# Patient Record
Sex: Female | Born: 1970 | Race: White | Hispanic: No | Marital: Married | Smoking: Never smoker
Health system: Southern US, Community
[De-identification: ages and names within clinical notes are randomized; demographics above are authoritative.]

## PROBLEM LIST (undated history)

## (undated) DIAGNOSIS — R519 Headache, unspecified: Secondary | ICD-10-CM

## (undated) DIAGNOSIS — R3989 Other symptoms and signs involving the genitourinary system: Secondary | ICD-10-CM

## (undated) DIAGNOSIS — N3020 Other chronic cystitis without hematuria: Secondary | ICD-10-CM

## (undated) DIAGNOSIS — D414 Neoplasm of uncertain behavior of bladder: Secondary | ICD-10-CM

## (undated) DIAGNOSIS — N200 Calculus of kidney: Secondary | ICD-10-CM

## (undated) DIAGNOSIS — N3010 Interstitial cystitis (chronic) without hematuria: Secondary | ICD-10-CM

## (undated) DIAGNOSIS — K8020 Calculus of gallbladder without cholecystitis without obstruction: Secondary | ICD-10-CM

## (undated) DIAGNOSIS — R51 Headache: Secondary | ICD-10-CM

## (undated) DIAGNOSIS — G8929 Other chronic pain: Secondary | ICD-10-CM

## (undated) DIAGNOSIS — R310 Gross hematuria: Secondary | ICD-10-CM

## (undated) HISTORY — DX: Calculus of kidney: N20.0

## (undated) HISTORY — PX: BOTOX INJECTION: SHX5754

## (undated) HISTORY — DX: Headache: R51

## (undated) HISTORY — DX: Interstitial cystitis (chronic) without hematuria: N30.10

## (undated) HISTORY — DX: Other symptoms and signs involving the genitourinary system: R39.89

## (undated) HISTORY — DX: Other chronic cystitis without hematuria: N30.20

## (undated) HISTORY — DX: Gross hematuria: R31.0

## (undated) HISTORY — DX: Neoplasm of uncertain behavior of bladder: D41.4

## (undated) HISTORY — DX: Other chronic pain: G89.29

## (undated) HISTORY — DX: Headache, unspecified: R51.9

## (undated) HISTORY — PX: CYSTOSTOMY W/ BLADDER BIOPSY: SHX1431

## (undated) HISTORY — PX: BILATERAL SALPINGOOPHORECTOMY: SHX1223

## (undated) HISTORY — PX: APPENDECTOMY: SHX54

## (undated) HISTORY — PX: LUMBAR SPINE SURGERY: SHX701

## (undated) HISTORY — PX: OTHER SURGICAL HISTORY: SHX169

## (undated) HISTORY — DX: Calculus of gallbladder without cholecystitis without obstruction: K80.20

---

## 1992-05-29 ENCOUNTER — Encounter: Payer: Self-pay | Admitting: Family Medicine

## 1992-05-29 LAB — CONVERTED CEMR LAB: Blood Glucose, Fasting: 78 mg/dL

## 1994-08-31 ENCOUNTER — Encounter: Payer: Self-pay | Admitting: Family Medicine

## 1994-08-31 LAB — CONVERTED CEMR LAB: Pap Smear: NORMAL

## 1995-06-19 ENCOUNTER — Encounter: Payer: Self-pay | Admitting: Family Medicine

## 1995-06-19 LAB — CONVERTED CEMR LAB: TSH: 0.92 microintl units/mL

## 1995-06-25 ENCOUNTER — Encounter: Payer: Self-pay | Admitting: Family Medicine

## 1995-06-25 LAB — CONVERTED CEMR LAB: Pap Smear: NORMAL

## 1998-01-26 ENCOUNTER — Ambulatory Visit (HOSPITAL_COMMUNITY): Admission: RE | Admit: 1998-01-26 | Discharge: 1998-01-26 | Payer: Self-pay | Admitting: Neurosurgery

## 1998-01-26 ENCOUNTER — Encounter: Payer: Self-pay | Admitting: Neurosurgery

## 1999-07-06 HISTORY — PX: LAPAROSCOPY ABDOMEN DIAGNOSTIC: PRO50

## 1999-10-06 HISTORY — PX: ABDOMINAL HYSTERECTOMY: SHX81

## 2000-10-08 ENCOUNTER — Encounter: Payer: Self-pay | Admitting: Family Medicine

## 2000-10-08 LAB — CONVERTED CEMR LAB: Blood Glucose, Fasting: 85 mg/dL

## 2001-12-08 ENCOUNTER — Encounter: Payer: Self-pay | Admitting: Family Medicine

## 2001-12-08 LAB — CONVERTED CEMR LAB
RBC count: 4.55 10*6/uL
WBC, blood: 6.7 10*3/uL

## 2004-02-01 ENCOUNTER — Ambulatory Visit: Payer: Self-pay | Admitting: Family Medicine

## 2005-10-09 ENCOUNTER — Ambulatory Visit: Payer: Self-pay | Admitting: Family Medicine

## 2005-10-09 ENCOUNTER — Ambulatory Visit: Payer: Self-pay | Admitting: Internal Medicine

## 2005-10-23 ENCOUNTER — Ambulatory Visit: Payer: Self-pay | Admitting: Surgery

## 2005-10-23 HISTORY — PX: CHOLECYSTECTOMY: SHX55

## 2005-11-19 ENCOUNTER — Ambulatory Visit: Payer: Self-pay | Admitting: Surgery

## 2005-11-19 ENCOUNTER — Ambulatory Visit: Payer: Self-pay | Admitting: Unknown Physician Specialty

## 2005-11-23 ENCOUNTER — Ambulatory Visit: Payer: Self-pay | Admitting: Surgery

## 2007-03-29 ENCOUNTER — Emergency Department: Payer: Self-pay | Admitting: Emergency Medicine

## 2007-04-14 ENCOUNTER — Ambulatory Visit: Payer: Self-pay | Admitting: Family Medicine

## 2007-10-06 HISTORY — PX: CT ABD W & PELVIS WO CM: HXRAD294

## 2007-10-18 ENCOUNTER — Ambulatory Visit: Payer: Self-pay | Admitting: Urology

## 2007-10-18 ENCOUNTER — Encounter: Payer: Self-pay | Admitting: Family Medicine

## 2007-10-18 DIAGNOSIS — D414 Neoplasm of uncertain behavior of bladder: Secondary | ICD-10-CM | POA: Insufficient documentation

## 2007-10-26 DIAGNOSIS — K5730 Diverticulosis of large intestine without perforation or abscess without bleeding: Secondary | ICD-10-CM | POA: Insufficient documentation

## 2007-10-28 ENCOUNTER — Ambulatory Visit: Payer: Self-pay | Admitting: Urology

## 2007-11-03 HISTORY — PX: OTHER SURGICAL HISTORY: SHX169

## 2007-11-08 ENCOUNTER — Ambulatory Visit: Payer: Self-pay | Admitting: Urology

## 2007-11-18 ENCOUNTER — Encounter: Payer: Self-pay | Admitting: Family Medicine

## 2007-11-18 DIAGNOSIS — N3010 Interstitial cystitis (chronic) without hematuria: Secondary | ICD-10-CM | POA: Insufficient documentation

## 2007-11-18 DIAGNOSIS — R310 Gross hematuria: Secondary | ICD-10-CM | POA: Insufficient documentation

## 2007-11-23 DIAGNOSIS — N200 Calculus of kidney: Secondary | ICD-10-CM | POA: Insufficient documentation

## 2008-03-05 ENCOUNTER — Encounter: Payer: Self-pay | Admitting: Family Medicine

## 2008-04-12 ENCOUNTER — Emergency Department: Payer: Self-pay | Admitting: Emergency Medicine

## 2008-06-12 ENCOUNTER — Ambulatory Visit: Payer: Self-pay | Admitting: Family Medicine

## 2008-06-12 DIAGNOSIS — J019 Acute sinusitis, unspecified: Secondary | ICD-10-CM | POA: Insufficient documentation

## 2008-06-13 ENCOUNTER — Encounter: Payer: Self-pay | Admitting: Family Medicine

## 2008-06-14 DIAGNOSIS — E785 Hyperlipidemia, unspecified: Secondary | ICD-10-CM | POA: Insufficient documentation

## 2008-06-14 DIAGNOSIS — E7800 Pure hypercholesterolemia, unspecified: Secondary | ICD-10-CM

## 2008-07-13 ENCOUNTER — Telehealth (INDEPENDENT_AMBULATORY_CARE_PROVIDER_SITE_OTHER): Payer: Self-pay | Admitting: Internal Medicine

## 2008-11-26 ENCOUNTER — Ambulatory Visit: Payer: Self-pay | Admitting: Urology

## 2008-12-12 ENCOUNTER — Encounter: Payer: Self-pay | Admitting: Family Medicine

## 2009-03-18 ENCOUNTER — Ambulatory Visit: Payer: Self-pay | Admitting: Unknown Physician Specialty

## 2009-08-13 ENCOUNTER — Encounter (INDEPENDENT_AMBULATORY_CARE_PROVIDER_SITE_OTHER): Payer: Self-pay | Admitting: *Deleted

## 2009-12-25 ENCOUNTER — Ambulatory Visit: Payer: Self-pay | Admitting: Family Medicine

## 2010-01-16 ENCOUNTER — Encounter: Payer: Self-pay | Admitting: Family Medicine

## 2010-02-04 NOTE — Letter (Signed)
Summary: Nadara Eaton letter  [place] [facility]  [address] [place], [place] [address]   Phone: [phone]  Fax: [phone]       08/13/2009 MRN: [id number]  [facility] [address] [place], [place]  [address]  Dear Ms. [facility] [place], and [facility] announce the retirement of Arta Silence, M.D., from full-time practice at the [facility] office effective July 04, 2009 and his plans of returning part-time.  It is important to Dr. Hetty Ely and to our practice that you understand that [facility] has seven physicians in our office for your health care needs.  We will continue to offer the same exceptional care that you have today.    Dr. Hetty Ely has spoken to many of you about his plans for retirement and returning part-time in the fall.   We will continue to work with you through the transition to schedule appointments for you in the office and meet the high standards that [facility] is committed to.   Again, it is with great pleasure that we share the news that Dr. Hetty Ely will return to [facility] in October of 2011 with a reduced schedule.    If you have any questions, or would like to request an appointment with one of our physicians, please call us at [phone] and press the option for Scheduling an appointment.  We take pleasure in providing you with excellent patient care and look forward to seeing you at your next office visit.  Our [facility] Physicians are:  Tillman Abide, M.D. Laurita Quint, M.D. Roxy Manns, M.D. Kerby Nora, M.D. Hannah Beat, M.D. Ruthe Mannan, M.D. We proudly welcomed Raechel Ache, M.D. and Eustaquio Boyden, M.D. to the practice in July/August 2011.  Sincerely,  [facility]

## 2010-02-04 NOTE — Letter (Signed)
Summary: [facility] / EVAL OF A BLADDER MASS / DR. Arlys John COPE  [facility] / EVAL OF A BLADDER MASS / DR. Arlys John COPE   Imported By: Carin Primrose 03/06/2008 12:20:18  _____________________________________________________________________  External Attachment:    Type:   Image     Comment:   External Document

## 2010-02-06 NOTE — Letter (Signed)
Summary: [facility]  [facility]   Imported By: Maryln Gottron 01/27/2010 12:27:13  _____________________________________________________________________  External Attachment:    Type:   Image     Comment:   External Document

## 2010-11-25 ENCOUNTER — Ambulatory Visit (INDEPENDENT_AMBULATORY_CARE_PROVIDER_SITE_OTHER): Payer: PRIVATE HEALTH INSURANCE | Admitting: Family Medicine

## 2010-11-25 ENCOUNTER — Encounter: Payer: Self-pay | Admitting: Family Medicine

## 2010-11-25 DIAGNOSIS — B009 Herpesviral infection, unspecified: Secondary | ICD-10-CM

## 2010-11-25 DIAGNOSIS — J111 Influenza due to unidentified influenza virus with other respiratory manifestations: Secondary | ICD-10-CM

## 2010-11-25 DIAGNOSIS — B001 Herpesviral vesicular dermatitis: Secondary | ICD-10-CM | POA: Insufficient documentation

## 2010-11-25 DIAGNOSIS — J019 Acute sinusitis, unspecified: Secondary | ICD-10-CM

## 2010-11-25 MED ORDER — CHLORPHENIRAMINE-HYDROCODONE 8-10 MG/5ML PO LQCR: 5.0000 mL | Freq: Two times a day (BID) | ORAL | Status: DC | PRN

## 2010-11-25 MED ORDER — AZITHROMYCIN 2 G PO SUSR: 2.0000 g | Freq: Once | ORAL | Status: AC

## 2010-11-25 MED ORDER — VALACYCLOVIR HCL 500 MG PO TABS: 500.0000 mg | ORAL_TABLET | Freq: Three times a day (TID) | ORAL | Status: DC

## 2010-11-25 NOTE — Progress Notes (Signed)
  Subjective:    Patient ID: Hannah [name], female    DOB: 1970/08/01, 40 y.o.   MRN: [id number]  HPI Pt of mine last seen over two years ago for similar sxs now here for cough, drainage and fever up to 102. She is congested and slightly hoarse. She has had fever to 102 yesterday at work after Advil. She has some chest congestion and bad cough woith productive gray mucous. She has ear discomfort and headache in the frontal area with cough. She ha been sleeping poorly thr lewst few nights. Nasal discharge has been mild but has mild ST. She had N/V this AM. She is very achey.  She has been seeing Dr Achilles Dunk for ICS. She has been having lots of discomfort from that. Migraines are improving. Back is better.  She has a fever blister forming as well.  She now works at [employer] in [place] but they are programmed to relocate outside the country in two years.    Review of SystemsNoncontributory except as above.       Objective:   Physical Exam  Constitutional: She appears well-developed and well-nourished. No distress.       Congested and hoarse.  HENT:  Head: Normocephalic and atraumatic.  Right Ear: External ear normal.  Left Ear: External ear normal.  Nose: Nose normal.  Mouth/Throat: Oropharynx is clear and moist. No oropharyngeal exudate.  Eyes: Conjunctivae and EOM are normal. Pupils are equal, round, and reactive to light.  Neck: Normal range of motion. Neck supple. No thyromegaly present.  Cardiovascular: Normal rate, regular rhythm and normal heart sounds.   Pulmonary/Chest: Effort normal and breath sounds normal. She has no wheezes. She has no rales.  Lymphadenopathy:    She has no cervical adenopathy.  Skin: She is not diaphoretic.          Assessment & Plan:

## 2010-11-25 NOTE — Assessment & Plan Note (Signed)
Has used valtrex in the past. Will represcribe. Use as directed.

## 2010-11-25 NOTE — Assessment & Plan Note (Signed)
Will treat as bacterial with Zithro. See instructions.

## 2010-11-25 NOTE — Assessment & Plan Note (Signed)
Probably an element of this as well. Will try to decongest, see instructions.

## 2010-11-25 NOTE — Patient Instructions (Signed)
Take Zithro. Take Guaifenesin (400mg ), take 11/2 tabs by mouth AM and NOON. Get GUAIFENESIN by  [facility] or [facility] and getting MUCOUS RELIEF EXPECTORANT/CONGESTION. DO NOT GET MUCINEX (Timed Release Guaifenesin)  Drink fluids. Take Delsym during the day. Take Tussionex at night. Vicks at night. Keep lozenge in mouth during day.

## 2010-11-28 ENCOUNTER — Telehealth: Payer: Self-pay | Admitting: *Deleted

## 2010-11-28 MED ORDER — AZITHROMYCIN 250 MG PO TABS: ORAL_TABLET | ORAL | Status: AC

## 2010-11-28 NOTE — Telephone Encounter (Signed)
Patient advised.

## 2010-11-28 NOTE — Telephone Encounter (Signed)
Sent!

## 2010-11-28 NOTE — Telephone Encounter (Signed)
Pt was seen earlier this week for bronchitis and give zithromycin to take as one dose.  She says that problem is better but now she has a full blown sinus infection with drainage and pressure and she is asking that a z-pack be called to [employer].  She says she needs the full pack to get over this.

## 2011-02-23 ENCOUNTER — Encounter: Payer: Self-pay | Admitting: Family Medicine

## 2011-02-23 DIAGNOSIS — F419 Anxiety disorder, unspecified: Secondary | ICD-10-CM | POA: Insufficient documentation

## 2011-06-18 ENCOUNTER — Encounter: Payer: Self-pay | Admitting: Family Medicine

## 2011-06-18 ENCOUNTER — Ambulatory Visit (INDEPENDENT_AMBULATORY_CARE_PROVIDER_SITE_OTHER): Payer: PRIVATE HEALTH INSURANCE | Admitting: Family Medicine

## 2011-06-18 VITALS — BP 126/84 | HR 93 | Temp 98.5°F | Wt 192.0 lb

## 2011-06-18 DIAGNOSIS — M549 Dorsalgia, unspecified: Secondary | ICD-10-CM

## 2011-06-18 DIAGNOSIS — G43109 Migraine with aura, not intractable, without status migrainosus: Secondary | ICD-10-CM

## 2011-06-18 MED ORDER — ZOLMITRIPTAN 5 MG PO TABS: 2.5000 mg | ORAL_TABLET | ORAL | Status: DC | PRN

## 2011-06-18 MED ORDER — LIDOCAINE 5 % EX PTCH: 1.0000 | MEDICATED_PATCH | CUTANEOUS | Status: DC

## 2011-06-18 MED ORDER — CYCLOBENZAPRINE HCL 10 MG PO TABS: 10.0000 mg | ORAL_TABLET | Freq: Three times a day (TID) | ORAL | Status: DC | PRN

## 2011-06-18 NOTE — Progress Notes (Signed)
Back pain.  Used Lidoderm patches prev with a flare.  Used since back surgery with relief.  Used episodically.  Again with flare of back pain in typical location, down R leg, radicular distribution.  No trigger, fall, trauma with this episode.  No leg weakness.  No bowel/bladder incontinence.  She can take ibuprofen, took some today.  Has rx for hydrocodone at home, hasn't used.  It was left over from renal stone prev.  Also has used fiorcet for back pain and headaches, but not recently.   H/o migraines with aura.  Episodic. Much improved after hysterectomy.  She would like refill zomig.  Has used prev with relief.    PMH and SH reviewed  ROS: See HPI, otherwise noncontributory.  Meds, vitals, and allergies reviewed.   Nad ncat Mmm rrr ctab  abd soft, not ttp Ext w/o edema Back w/o midline pain but pain into R leg with hip flexion, walks with limp and adjusts in chair to get relief.  CN 2-12 wnl B, S/S/DTR wnl x4 No rash

## 2011-06-18 NOTE — Patient Instructions (Signed)
I would use the patches as needed along with ibuprofen (up to 600mg  3 times a day) with food.  Keep stretching.  Take the zomig as needed.  Take care.  Glad to see you.

## 2011-06-19 ENCOUNTER — Encounter: Payer: Self-pay | Admitting: Family Medicine

## 2011-06-19 DIAGNOSIS — M549 Dorsalgia, unspecified: Secondary | ICD-10-CM | POA: Insufficient documentation

## 2011-06-19 DIAGNOSIS — G43109 Migraine with aura, not intractable, without status migrainosus: Secondary | ICD-10-CM | POA: Insufficient documentation

## 2011-06-19 NOTE — Assessment & Plan Note (Signed)
Refill triptan, she may need fiorcet rx in future.  Improved after hysterectomy.

## 2011-06-19 NOTE — Assessment & Plan Note (Signed)
Routine flare, use ibuprofen and lidoderm patches and f/u prn.  D/w pt about weight and stretching.  No emergent sx.  F/u prn.  >25 min spent with face to face with patient, >50% counseling and/or coordinating care.

## 2011-09-02 ENCOUNTER — Emergency Department (HOSPITAL_COMMUNITY)
Admission: EM | Admit: 2011-09-02 | Discharge: 2011-09-02 | Disposition: A | Discharge status: Home / Self Care | Payer: PRIVATE HEALTH INSURANCE | Attending: Emergency Medicine | Admitting: Emergency Medicine

## 2011-09-02 ENCOUNTER — Telehealth: Payer: Self-pay | Admitting: Family Medicine

## 2011-09-02 ENCOUNTER — Encounter (HOSPITAL_COMMUNITY): Payer: Self-pay | Admitting: Family Medicine

## 2011-09-02 DIAGNOSIS — R51 Headache: Secondary | ICD-10-CM | POA: Insufficient documentation

## 2011-09-02 DIAGNOSIS — R209 Unspecified disturbances of skin sensation: Secondary | ICD-10-CM | POA: Insufficient documentation

## 2011-09-02 NOTE — Telephone Encounter (Signed)
Pt is calling concerning her left side of her face and left eye that has been twitching. Under neath left eye around to her face has become numb and tingling. Left lip has been drawing up off and on ?? Today it has become more pronounced and more severe. She is now having pains in her face with it. She is not sure what she should do or if she needs to come today or if can wait until tomorrow???

## 2011-09-02 NOTE — ED Notes (Signed)
Pt notified RN that she was leaving.  Talked with pt about the importance of staying and being seen by MD.  Pt refused and said she is leaving.

## 2011-09-02 NOTE — Telephone Encounter (Signed)
Patient notified as instructed by telephone. Patient stated that she is at work and will call her husband and have him come and take her to the ER. Patient stated that she will call back and let Dr. Para March know where she will be going.

## 2011-09-02 NOTE — Telephone Encounter (Signed)
Patient called to let you know she's at [facility] ER.  They may do a CT Scan.  She's still numb and tingling and the pain is worse on her cheekbone and temple.  Patient is upset because when she went into the ER they took all of her medications that were over 90 days out of the system.

## 2011-09-02 NOTE — ED Notes (Signed)
Per pt sts since Monday she has had left sided facial pain, numbness, tinglingling that has gotten worse. sts started in her eye and had extended throughout the entire left side of her face. sts also some tingling in left arm. No other deficits noted. Pt A&Ox3.

## 2011-09-02 NOTE — Telephone Encounter (Signed)
If she is having new unilateral neurologic sx, then I would go to ER ASAP or dial 911.

## 2011-09-02 NOTE — Telephone Encounter (Signed)
Noted. Will await ER notes.

## 2011-09-03 ENCOUNTER — Other Ambulatory Visit: Payer: Self-pay | Admitting: Family Medicine

## 2011-09-03 NOTE — Telephone Encounter (Signed)
Electronic refill request.  Please advise. 

## 2011-09-03 NOTE — Telephone Encounter (Signed)
Spoke with Hannah [name] about this. Pt is calling back about her experience at the ED and how bad it was . She had to wait for 4 hours and was still going to have wait another 3 hours after she left. She said she couldn't wait any longer and there were so many sick people sitting out there. She got a migraine from sitting there. She says she not mad at [place] she was furious with the ER staff and how badly they treated her. She spoke with the nursing director over there as well. They told her they didn't know why her PCP did not schedule a CT scan because it would have saved some time. I told her she would have to been seen before we could schedule that for her. She understood. She wanted to make sure that her Zomeg for her migrane's would be called in by the end of today if possible. I wasn't sure if you wanted her to come back in to be seen or not ??? She says she has gotten 80% better than yesterday but her face is not completely better ???

## 2011-09-03 NOTE — Telephone Encounter (Signed)
I still don't know what her condition is since she hasn't been seen.  I will not send for CT w/o eval first.  I would still advise eval, either at our clinic or the ER if emergent neuro sx. Refill sent, but this doesn't change the advice prev listed.

## 2011-09-04 NOTE — Telephone Encounter (Signed)
LMOVM of home phone.  Cell phone did not have messaging capabilities.

## 2011-09-11 ENCOUNTER — Ambulatory Visit (INDEPENDENT_AMBULATORY_CARE_PROVIDER_SITE_OTHER): Payer: PRIVATE HEALTH INSURANCE | Admitting: Family Medicine

## 2011-09-11 ENCOUNTER — Encounter: Payer: Self-pay | Admitting: Family Medicine

## 2011-09-11 VITALS — BP 110/78 | HR 90 | Temp 98.6°F | Wt 190.8 lb

## 2011-09-11 DIAGNOSIS — J069 Acute upper respiratory infection, unspecified: Secondary | ICD-10-CM

## 2011-09-11 DIAGNOSIS — R799 Abnormal finding of blood chemistry, unspecified: Secondary | ICD-10-CM | POA: Insufficient documentation

## 2011-09-11 DIAGNOSIS — R339 Retention of urine, unspecified: Secondary | ICD-10-CM | POA: Insufficient documentation

## 2011-09-11 DIAGNOSIS — G43109 Migraine with aura, not intractable, without status migrainosus: Secondary | ICD-10-CM

## 2011-09-11 DIAGNOSIS — R350 Frequency of micturition: Secondary | ICD-10-CM | POA: Insufficient documentation

## 2011-09-11 MED ORDER — BENZONATATE 200 MG PO CAPS: 200.0000 mg | ORAL_CAPSULE | Freq: Three times a day (TID) | ORAL | Status: AC | PRN

## 2011-09-11 NOTE — Progress Notes (Signed)
Recently with possible migraine.  She had an episode with L side of face was twitching and and tingling and then it felt like her lower lip and eyebrow were drawing to the left side (but it wasn't noted on inspection by patient).  She couldn't wait for ER eval and the sx started to resolve.  If fully resolved for a few days but then it restarted in last 2 days.  She has some numbness/tingling in the L side of the face.    This isn't typical for her old migraines- they were much improved after hysterectomy.  She had the prev skin drawing sensation but it was on her scalp (with episodes years ago).  No nausea with these recent episodes.    She has had some HA recently that responded to zomig.  Last zomig dose was 1 week ago.  No rash.    No right sided facial sx.  She has some tingling in the L arm on the day of the ER visit but this was brief and not pronounced.    Also with cough, rhinorrhea, sputum production in last few days.  Using SABA with some relief, but it makes her jittery. She's achy.    Meds, vitals, and allergies reviewed.   ROS: See HPI.  Otherwise, noncontributory.  GEN: nad, alert and oriented HEENT: mucous membranes moist, tm w/o erythema, nasal exam w/o erythema, clear discharge noted,  OP with cobblestoning NECK: supple w/o LA CV: rrr.   PULM: ctab, no inc wob EXT: no edema SKIN: no acute rash CN 2-12 wnl B, S/S/DTR wnl x4 but photophobia on exam noted

## 2011-09-11 NOTE — Patient Instructions (Signed)
It appears that you are having migraines again.  Get back on the migraine diet. Use the zomig and flexeril as needed.  Use tessalon and the inhaler for the cough.  It should gradually get better.  If the migraines don't improve, then notify me.

## 2011-09-13 ENCOUNTER — Encounter: Payer: Self-pay | Admitting: Family Medicine

## 2011-09-13 DIAGNOSIS — J069 Acute upper respiratory infection, unspecified: Secondary | ICD-10-CM | POA: Insufficient documentation

## 2011-09-13 NOTE — Assessment & Plan Note (Signed)
Likely viral, nontoxic, supportive tx and f/u prn.  She agrees. ddx d/w pt.

## 2011-09-13 NOTE — Assessment & Plan Note (Signed)
It appears that she is having recurrent migraines with the neuro sx described above.  Highly unlikely for another dx at this point, ie CVA.  Neuro exam wnl except for photophobia as expected.  At this point, will treat as migraine with zomig and flexeril.  She'll start back on migraine diet and will update me as needed.  She does have sig social stressors with family situation recently and this may have influenced her migraine frequency.  >25 min spent with face to face with patient, >50% counseling and/or coordinating care.  She agrees with plan.

## 2011-12-24 ENCOUNTER — Ambulatory Visit: Payer: Self-pay | Admitting: Urology

## 2012-02-10 ENCOUNTER — Encounter: Payer: Self-pay | Admitting: Family Medicine

## 2012-02-10 DIAGNOSIS — R945 Abnormal results of liver function studies: Secondary | ICD-10-CM

## 2012-03-21 ENCOUNTER — Ambulatory Visit: Payer: PRIVATE HEALTH INSURANCE | Admitting: Family Medicine

## 2012-03-21 ENCOUNTER — Other Ambulatory Visit: Payer: Self-pay

## 2012-03-21 MED ORDER — ALBUTEROL SULFATE HFA 108 (90 BASE) MCG/ACT IN AERS: 1.0000 | INHALATION_SPRAY | RESPIRATORY_TRACT | Status: DC | PRN

## 2012-03-21 NOTE — Telephone Encounter (Signed)
Sent, if not improved or if needing >2-3x/week, then she needs f/u here.  Thanks.

## 2012-03-21 NOTE — Telephone Encounter (Signed)
Patient has appt here today with Dr. Para March.

## 2012-03-21 NOTE — Telephone Encounter (Signed)
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 One inhaler last pt at least one year.[facility].Please advise.

## 2012-03-22 ENCOUNTER — Ambulatory Visit (INDEPENDENT_AMBULATORY_CARE_PROVIDER_SITE_OTHER): Payer: PRIVATE HEALTH INSURANCE | Admitting: Family Medicine

## 2012-03-22 ENCOUNTER — Encounter: Payer: Self-pay | Admitting: Family Medicine

## 2012-03-22 VITALS — BP 122/76 | HR 92 | Temp 98.2°F

## 2012-03-22 DIAGNOSIS — J019 Acute sinusitis, unspecified: Secondary | ICD-10-CM

## 2012-03-22 DIAGNOSIS — J329 Chronic sinusitis, unspecified: Secondary | ICD-10-CM | POA: Insufficient documentation

## 2012-03-22 MED ORDER — AMOXICILLIN-POT CLAVULANATE 875-125 MG PO TABS: 1.0000 | ORAL_TABLET | Freq: Two times a day (BID) | ORAL | Status: DC

## 2012-03-22 MED ORDER — HYDROCOD POLST-CHLORPHEN POLST 10-8 MG/5ML PO LQCR: 5.0000 mL | Freq: Two times a day (BID) | ORAL | Status: DC | PRN

## 2012-03-22 MED ORDER — FLUCONAZOLE 150 MG PO TABS: 150.0000 mg | ORAL_TABLET | Freq: Once | ORAL | Status: DC

## 2012-03-22 NOTE — Patient Instructions (Addendum)
Start the augmentin today, use the diflucan if needed.  Continue the albuterol as needed and take the tussionex as needed (sedation caution). Take care.  Glad to see you.

## 2012-03-22 NOTE — Progress Notes (Signed)
Recently with ST, postnasal gtt, fatigue, cough.  Started about 5 day ago. Taking otc cough and allergy meds w/o relief. Rhinorrhea, discolored. Then had more facial pressure.  More cough and wheeze over the last few days, better now with SABA.  Nearly post-tussive emesis.  Now with discolored sputum.  Prev with R ear pain.  She doesn't normally need albuterol.  Unclear if she had a fever.  She has felt hot and had chills.    Meds, vitals, and allergies reviewed.   ROS: See HPI.  Otherwise, noncontributory.  GEN: nad, alert and oriented HEENT: mucous membranes moist, tm w/o erythema, nasal exam w/o erythema, clear discharge noted,  OP with cobblestoning, sinuses ttp x4 NECK: supple w/o LA CV: rrr.   PULM: ctab, no inc wob, no wheeze.  Dry cough noted.  EXT: no edema SKIN: no acute rash

## 2012-03-22 NOTE — Assessment & Plan Note (Signed)
Nontoxic, augmentin, supportive tx.  Use diflucan if needed after abx.  Continue saba and use tussionex prn cough, sedation caution.  F/u prn.

## 2012-05-03 ENCOUNTER — Ambulatory Visit: Payer: Self-pay | Admitting: Urology

## 2012-11-03 ENCOUNTER — Ambulatory Visit (INDEPENDENT_AMBULATORY_CARE_PROVIDER_SITE_OTHER): Payer: Managed Care, Other (non HMO) | Admitting: Family Medicine

## 2012-11-03 ENCOUNTER — Encounter: Payer: Self-pay | Admitting: Family Medicine

## 2012-11-03 VITALS — BP 118/72 | HR 80 | Temp 97.3°F | Wt 192.5 lb

## 2012-11-03 DIAGNOSIS — G43109 Migraine with aura, not intractable, without status migrainosus: Secondary | ICD-10-CM

## 2012-11-03 DIAGNOSIS — N390 Urinary tract infection, site not specified: Secondary | ICD-10-CM | POA: Insufficient documentation

## 2012-11-03 DIAGNOSIS — R300 Dysuria: Secondary | ICD-10-CM

## 2012-11-03 LAB — POCT URINALYSIS DIPSTICK
Glucose, UA: NEGATIVE
Spec Grav, UA: <=1.005
Urobilinogen, UA: NEGATIVE

## 2012-11-03 MED ORDER — CIPROFLOXACIN HCL 250 MG PO TABS: 250.0000 mg | ORAL_TABLET | Freq: Two times a day (BID) | ORAL | Status: DC

## 2012-11-03 MED ORDER — LIDOCAINE 5 % EX PTCH: 1.0000 | MEDICATED_PATCH | CUTANEOUS | Status: DC

## 2012-11-03 NOTE — Assessment & Plan Note (Signed)
Would try ibuprofen for tension headaches and would avoid butalbital for now.

## 2012-11-03 NOTE — Progress Notes (Signed)
Botox in bladder had helped patient a lot.  Recently with a twitchy sensation in lower abd. Then some lower back pain.  Then with dysuria, burning with urination.  She is achy.  She couldn't get in with uro so she came in here.  U/a d/w pt.   Migraines better after the hysterectomy.  She has usually rare sx.  Likely worse with schedule changes and travel.  She went from 1 a year to 3 or 4 a year.  She had taken butalbital rarely for tension headaches.  She had substituted ibuprofen instead.    Meds, vitals, and allergies reviewed.   ROS: See HPI.  Otherwise negative.    GEN: nad, alert and oriented HEENT: mucous membranes moist NECK: supple CV: rrr.  PULM: ctab, no inc wob ABD: soft, +bs, suprapubic area tender EXT: no edema SKIN: no acute rash BACK: no CVA pain

## 2012-11-03 NOTE — Assessment & Plan Note (Signed)
Cipro, fluids, ucx, fu prn.  D/w pt.  She agrees, nontoxic.

## 2012-11-03 NOTE — Patient Instructions (Signed)
Drink plenty of water and start the antibiotics today.  We'll contact you with your lab report.  Take care.   

## 2012-11-04 ENCOUNTER — Ambulatory Visit: Payer: PRIVATE HEALTH INSURANCE | Admitting: Family Medicine

## 2012-11-05 LAB — URINE CULTURE: Colony Count: >=100000

## 2012-11-07 ENCOUNTER — Telehealth: Payer: Self-pay | Admitting: Family Medicine

## 2012-11-07 ENCOUNTER — Telehealth: Payer: Self-pay

## 2012-11-07 ENCOUNTER — Telehealth: Payer: Self-pay | Admitting: *Deleted

## 2012-11-07 MED ORDER — BUTALBITAL-ASPIRIN-CAFFEINE 50-325-40 MG PO CAPS: 1.0000 | ORAL_CAPSULE | Freq: Two times a day (BID) | ORAL | Status: DC | PRN

## 2012-11-07 MED ORDER — CIPROFLOXACIN HCL 500 MG PO TABS: 500.0000 mg | ORAL_TABLET | Freq: Two times a day (BID) | ORAL | Status: DC

## 2012-11-07 NOTE — Telephone Encounter (Signed)
Patient says she became sicker over the weekend and when she called in, the MD on call looked at her culture and gave her some advice.  She wanted to be sure that you knew that. Also, she found her mother dead yesterday and would like to talk to you about getting a Rx to help her through this time of stress.  She asks that you call her at [phone]

## 2012-11-07 NOTE — Telephone Encounter (Signed)
I called pt.  I offered my condolences.  ucx sens to cipro, rx sent.   We talked about the pain meds for HA.  Would be reasonable to try given the recent episodes.  rx sent.  Please call in the fiorinal.   She'll try melatonin and benadryl for sleep if needed.   She'll update me as needed.   I talked to her about the ucx results, you don't need to call her about that.

## 2012-11-07 NOTE — Telephone Encounter (Signed)
See next note

## 2012-11-07 NOTE — Telephone Encounter (Signed)
Pt was seen 11/03/12; pt said she called back on 11/05/12 and spoke with on call dr who advised pt to double her cipro and contact pcp today; pt has taken cipro 2 tab on Sat night and 2 tab twice a day on Sun.Today pt states she is 40 % better with UTI symptoms but is out of med and request Cipro for long term use or a stronger antibiotic sent [facility]. Pt found her mother dead on November 14, 2012 and needs Fiorinal without codeine sent to [facility] for h/a, pt said Dr Hetty Ely gave years ago.Please advise.

## 2012-11-07 NOTE — Telephone Encounter (Signed)
Medication phoned to pharmacy.  

## 2012-11-07 NOTE — Telephone Encounter (Signed)
[facility] Triage Call Report Triage Record Num: [id number] Operator: Remonia Richter Patient Name: Hannah [name] Call Date & Time: 11/05/2012 4:57:21PM Patient Phone: [phone] PCP: Crawford Givens Patient Gender: Female PCP Fax : Patient DOB: March 20, 1970 Practice Name: [facility] Reason for Call: Caller: [name]/Patient; PCP: Crawford Givens [name]) [facility]); CB#: [phone]; Call regarding Urinary Pain with urination, seen in office 11/03/12 and started on cipro and not improving, headache still present from OV, took Motrin 600mg  PO this am and helped headache with moderate relief, afebrile,LMP-past hysterectomy, asking about culture and if antibiotic is working,See provider in 4 hr disposition obtained due to flank pain and urinary symptoms ,Epic record checked and urine report with sensitivity called to Dr Fabian Sharp, she suggested increasing cipro to 500mg  PO BID for the weekend or patient may switch to Amoxicillin 500mg  PO TID x 5 days, patient given MD advice and prefers to increase her Cipro to 500mg  Po BiD and call monday if not improving,Home care advice given,callback parameters gone over Protocol(s) Used: Flank Pain Protocol(s) Used: Urinary Symptoms - Female Recommended Outcome per Protocol: See Provider within 4 hours Reason for Outcome: Flank pain Flank pain or low back pain AND urinary tract symptoms Care Advice: ~ Call provider if symptoms worsen or new symptoms develop. ~ SYMPTOM / CONDITION MANAGEMENT ~ List, or take, all current prescription(s), nonprescription or alternative medication(s) to provider for evaluation. 11/05/2012 5:18:14PM Page 1 of 1 [email or web address]

## 2012-11-07 NOTE — Telephone Encounter (Signed)
See other phone note

## 2012-12-21 ENCOUNTER — Ambulatory Visit: Payer: Managed Care, Other (non HMO) | Admitting: Family Medicine

## 2013-02-01 ENCOUNTER — Encounter: Payer: Self-pay | Admitting: Family Medicine

## 2013-02-01 ENCOUNTER — Ambulatory Visit (INDEPENDENT_AMBULATORY_CARE_PROVIDER_SITE_OTHER): Payer: Managed Care, Other (non HMO) | Admitting: Family Medicine

## 2013-02-01 VITALS — BP 110/76 | HR 64 | Temp 98.3°F | Wt 193.5 lb

## 2013-02-01 DIAGNOSIS — R0789 Other chest pain: Secondary | ICD-10-CM | POA: Insufficient documentation

## 2013-02-01 DIAGNOSIS — R079 Chest pain, unspecified: Secondary | ICD-10-CM

## 2013-02-01 MED ORDER — ZOLMITRIPTAN 5 MG PO TABS: 2.5000 mg | ORAL_TABLET | ORAL | Status: DC | PRN

## 2013-02-01 NOTE — Progress Notes (Signed)
Pre-visit discussion using our clinic review tool. No additional management support is needed unless otherwise documented below in the visit note.  Her mother died in 2022-12-13.  This has admittedly been difficult for her.  She has been having episodic chest pain.  It doesn't always correlate to her grieving episodes.  It can be a burning or stabbing pain on the L side of her chest.  She isn't always point tender.  It is deep in the chest.  It will last a few minutes at a time.  No syncope.  No CP today before exam.  Usually not SOB but she can attribute mild sx to anxiety related to the event.  This doesn't feel like a muscle spasm.  Intermittently going on for about 1 month. She has no exertional sx.  It can happen at rest, ie laying down going to sleep.  No taste changes.  Sx aren't improved with deep breathing.  Usually no vomiting, nausea unless with migraines.    Migraines- She needed a refill on her zomig.  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  No ADE on the medicine.   She likely had shingles on the L chest wall in fall 2014 but that felt superficial and not like this other pain mentioned above.    Meds, vitals, and allergies reviewed.   ROS: See HPI.  Otherwise, noncontributory.  GEN: nad, alert and oriented HEENT: mucous membranes moist NECK: supple w/o LA CV: rrr, chest wall ttp during the exam, at the L midclavicular line along the lower rib margin PULM: ctab, no inc wob ABD: soft, +bs EXT: no edema SKIN: no acute rash  EKG reviewed. No prev EKG for comparison.  No acute ST changes.

## 2013-02-01 NOTE — Patient Instructions (Signed)
I would try taking ibuprofen with food in the meantime, with prilosec 20mg  a day if needed.  If not improved, then let me know.  Take care.

## 2013-02-01 NOTE — Assessment & Plan Note (Signed)
Unlikely to be cardiac.  GI (GERD, HH) vs chest wall pain (reproduced on exam) vs anxiety component possible.  Would use ibuprofen for a few days with PPI as needed and see if this doesn't improve. She agrees.

## 2013-03-28 ENCOUNTER — Ambulatory Visit (INDEPENDENT_AMBULATORY_CARE_PROVIDER_SITE_OTHER): Payer: Managed Care, Other (non HMO) | Admitting: Family Medicine

## 2013-03-28 ENCOUNTER — Encounter: Payer: Self-pay | Admitting: Family Medicine

## 2013-03-28 VITALS — BP 116/80 | HR 79 | Temp 98.0°F | Wt 192.0 lb

## 2013-03-28 DIAGNOSIS — R300 Dysuria: Secondary | ICD-10-CM

## 2013-03-28 DIAGNOSIS — N390 Urinary tract infection, site not specified: Secondary | ICD-10-CM

## 2013-03-28 LAB — POCT URINALYSIS DIPSTICK
BILIRUBIN UA: NEGATIVE
Blood, UA: NEGATIVE
GLUCOSE UA: NEGATIVE
KETONES UA: NEGATIVE
LEUKOCYTES UA: NEGATIVE
NITRITE UA: NEGATIVE
Protein, UA: NEGATIVE
Spec Grav, UA: 1.01
Urobilinogen, UA: NEGATIVE
pH, UA: 6.5

## 2013-03-28 MED ORDER — CIPROFLOXACIN HCL 500 MG PO TABS: 500.0000 mg | ORAL_TABLET | Freq: Two times a day (BID) | ORAL | Status: DC

## 2013-03-28 NOTE — Assessment & Plan Note (Signed)
Nontoxic, cipro, ucx, fu prn.

## 2013-03-28 NOTE — Progress Notes (Signed)
Pre visit review using our clinic review tool, if applicable. No additional management support is needed unless otherwise documented below in the visit note.  Her chest sx got a lot better since the last OV.    Dysuria: yes, burning, back pain duration of symptoms: ~2 days abdominal pain:yes fevers:no back pain: yes, lower back pain Vomiting:no but some nausea She had needed less [place] since her botox injection.   She increased fluids in the meantime.   This doesn't feel like her prev episodes with renal stones.   Meds, vitals, and allergies reviewed.   ROS: See HPI.  Otherwise negative.    GEN: nad, alert and oriented HEENT: mucous membranes moist NECK: supple CV: rrr.  PULM: ctab, no inc wob ABD: soft, +bs, suprapubic area tender, no rebound tenderness EXT: no edema SKIN: no acute rash BACK: no CVA pain

## 2013-03-28 NOTE — Patient Instructions (Signed)
Drink plenty of water and start the antibiotics today.  We'll contact you with your lab report.  Take care.   

## 2013-03-30 ENCOUNTER — Other Ambulatory Visit: Payer: Self-pay | Admitting: Family Medicine

## 2013-03-30 DIAGNOSIS — R300 Dysuria: Secondary | ICD-10-CM

## 2013-03-30 LAB — URINE CULTURE: Colony Count: 35000

## 2013-03-30 MED ORDER — CEPHALEXIN 500 MG PO CAPS: 500.0000 mg | ORAL_CAPSULE | Freq: Two times a day (BID) | ORAL | Status: DC

## 2013-05-04 ENCOUNTER — Telehealth: Payer: Self-pay | Admitting: Family Medicine

## 2013-05-04 ENCOUNTER — Other Ambulatory Visit (INDEPENDENT_AMBULATORY_CARE_PROVIDER_SITE_OTHER): Payer: Managed Care, Other (non HMO)

## 2013-05-04 DIAGNOSIS — Z87440 Personal history of urinary (tract) infections: Secondary | ICD-10-CM

## 2013-05-04 NOTE — Telephone Encounter (Signed)
Order for ucx is in. Thanks.

## 2013-05-04 NOTE — Telephone Encounter (Signed)
Patient had a UTI a couple weeks ago and she was told when it cleared up to come back in and do another urine culture.  Patient would like to come in to have it done today or tomorrow.

## 2013-05-04 NOTE — Telephone Encounter (Signed)
Appointment scheduled.

## 2013-05-04 NOTE — Addendum Note (Signed)
Addended by: Tonia Ghent on: 05/04/2013 01:42 PM   Modules accepted: Orders

## 2013-05-06 LAB — URINE CULTURE
Colony Count: NO GROWTH
ORGANISM ID, BACTERIA: NO GROWTH

## 2013-10-05 HISTORY — PX: LAPAROSCOPIC APPENDECTOMY: SUR753

## 2013-10-28 ENCOUNTER — Ambulatory Visit: Payer: Self-pay | Admitting: Surgery

## 2013-10-28 LAB — COMPREHENSIVE METABOLIC PANEL
ALBUMIN: 4.1 g/dL (ref 3.4–5.0)
AST: 29 U/L (ref 15–37)
Alkaline Phosphatase: 89 U/L
Anion Gap: 8 (ref 7–16)
BUN: 11 mg/dL (ref 7–18)
Bilirubin,Total: 0.5 mg/dL (ref 0.2–1.0)
CHLORIDE: 104 mmol/L (ref 98–107)
Calcium, Total: 9 mg/dL (ref 8.5–10.1)
Co2: 27 mmol/L (ref 21–32)
Creatinine: 0.87 mg/dL (ref 0.60–1.30)
EGFR (African American): >60
Glucose: 114 mg/dL — ABNORMAL HIGH (ref 65–99)
Osmolality: 278 (ref 275–301)
POTASSIUM: 3.9 mmol/L (ref 3.5–5.1)
SGPT (ALT): 42 U/L
Sodium: 139 mmol/L (ref 136–145)
TOTAL PROTEIN: 7.8 g/dL (ref 6.4–8.2)

## 2013-10-28 LAB — CBC
HCT: 46.2 % (ref 35.0–47.0)
HGB: 14.8 g/dL (ref 12.0–16.0)
MCH: 28.9 pg (ref 26.0–34.0)
MCHC: 31.9 g/dL — ABNORMAL LOW (ref 32.0–36.0)
MCV: 91 fL (ref 80–100)
Platelet: 196 10*3/uL (ref 150–440)
RBC: 5.11 10*6/uL (ref 3.80–5.20)
RDW: 12.9 % (ref 11.5–14.5)
WBC: 14.4 10*3/uL — ABNORMAL HIGH (ref 3.6–11.0)

## 2013-10-28 LAB — URINALYSIS, COMPLETE
Bacteria: NONE SEEN
Bilirubin,UR: NEGATIVE
GLUCOSE, UR: NEGATIVE mg/dL (ref 0–75)
KETONE: NEGATIVE
Leukocyte Esterase: NEGATIVE
Nitrite: NEGATIVE
PROTEIN: NEGATIVE
Ph: 6 (ref 4.5–8.0)
SPECIFIC GRAVITY: 1.013 (ref 1.003–1.030)
WBC UR: 3 /HPF (ref 0–5)

## 2013-10-28 LAB — LIPASE, BLOOD: Lipase: 107 U/L (ref 73–393)

## 2013-11-13 ENCOUNTER — Encounter: Payer: Self-pay | Admitting: Family Medicine

## 2013-12-12 ENCOUNTER — Other Ambulatory Visit: Payer: Self-pay | Admitting: Family Medicine

## 2013-12-12 NOTE — Telephone Encounter (Signed)
Electronic refill request. Last Filled:    30 patch 1RF on  11/03/2012  Please advise.

## 2013-12-13 NOTE — Telephone Encounter (Signed)
Sent. Thanks.   

## 2014-03-20 ENCOUNTER — Encounter: Payer: Self-pay | Admitting: Family Medicine

## 2014-03-20 ENCOUNTER — Ambulatory Visit (INDEPENDENT_AMBULATORY_CARE_PROVIDER_SITE_OTHER): Payer: Managed Care, Other (non HMO) | Admitting: Family Medicine

## 2014-03-20 VITALS — BP 114/72 | HR 93 | Temp 98.7°F | Wt 191.5 lb

## 2014-03-20 DIAGNOSIS — R300 Dysuria: Secondary | ICD-10-CM

## 2014-03-20 DIAGNOSIS — N3000 Acute cystitis without hematuria: Secondary | ICD-10-CM

## 2014-03-20 DIAGNOSIS — R350 Frequency of micturition: Secondary | ICD-10-CM

## 2014-03-20 LAB — POCT URINALYSIS DIPSTICK
Bilirubin, UA: NEGATIVE
Blood, UA: NEGATIVE
LEUKOCYTES UA: NEGATIVE
Nitrite, UA: NEGATIVE
PH UA: 7
PROTEIN UA: NEGATIVE
SPEC GRAV UA: 1.02
UROBILINOGEN UA: 4

## 2014-03-20 LAB — POCT CBG (FASTING - GLUCOSE)-MANUAL ENTRY: Glucose Fasting, POC: 83 mg/dL (ref 70–99)

## 2014-03-20 MED ORDER — PHENAZOPYRIDINE HCL 200 MG PO TABS: 200.0000 mg | ORAL_TABLET | Freq: Three times a day (TID) | ORAL | Status: DC | PRN

## 2014-03-20 MED ORDER — CIPROFLOXACIN HCL 250 MG PO TABS: 250.0000 mg | ORAL_TABLET | Freq: Two times a day (BID) | ORAL | Status: DC

## 2014-03-20 MED ORDER — BUTALBITAL-ASPIRIN-CAFFEINE 50-325-40 MG PO CAPS: 1.0000 | ORAL_CAPSULE | Freq: Two times a day (BID) | ORAL | Status: DC | PRN

## 2014-03-20 NOTE — Patient Instructions (Signed)
Drink plenty of water and start the antibiotics today.  We'll contact you with your lab report.  Take care.   

## 2014-03-20 NOTE — Progress Notes (Signed)
Pre visit review using our clinic review tool, if applicable. No additional management support is needed unless otherwise documented below in the visit note.  Recently with low back pain, dysuria (pain).  She has urinary frequency at baseline from IC, and that isn't different.   Lower abd pain.  Felt chills.  No fevers.   ua abnormal but cbg wnl on check here today, d/w pt.   Meds, vitals, and allergies reviewed.   ROS: See HPI.  Otherwise, noncontributory.  GEN: nad, alert and oriented HEENT: mucous membranes moist NECK: supple CV: rrr.  PULM: ctab, no inc wob ABD: soft, +bs, suprapubic area tender EXT: no edema SKIN: no acute rash BACK: no CVA pain

## 2014-03-21 ENCOUNTER — Encounter: Payer: Self-pay | Admitting: Family Medicine

## 2014-03-21 NOTE — Assessment & Plan Note (Signed)
Likely not typical IC sx for patient.  Would start cipro, check ucx and f/u prn.  She agrees.   Incidentally 2 other items- needed refill on fiornal for HA, used rarely.  rx done.  Also with mother with h/o colon polyps in her 60s.  Patient will call back about scheduling GI eval.  She wanted to defer for now.

## 2014-03-22 LAB — URINE CULTURE

## 2014-03-26 ENCOUNTER — Telehealth: Payer: Self-pay

## 2014-03-26 NOTE — Telephone Encounter (Signed)
Call pt.  Check on her, get update.   3 days of cipro was intentional and not a mistake.  That covers majority of UTIs and limits exposure to abx and adverse events from abx.  occ will need longer course, but that is rare.

## 2014-03-26 NOTE — Telephone Encounter (Signed)
Noted, thanks.  Glad she is better.

## 2014-03-26 NOTE — Telephone Encounter (Signed)
Patient advised.  Patient is doing better now.

## 2014-03-26 NOTE — Telephone Encounter (Signed)
PLEASE NOTE: All timestamps contained within this report are represented as [place] Standard Time. CONFIDENTIALTY NOTICE: This fax transmission is intended only for the addressee. It contains information that is legally privileged, confidential or otherwise protected from use or disclosure. If you are not the intended recipient, you are strictly prohibited from reviewing, disclosing, copying using or disseminating any of this information or taking any action in reliance on or regarding this information. If you have received this fax in error, please notify us immediately by telephone so that we can arrange for its return to [place]. Phone: [phone], Toll-Free: [phone], Fax: [phone] Page: 1 of 3 Call Id: [id number] [place] Patient Name: Hannah [name] Gender: Female DOB: 09/06/1970 Age: 44 Y 81 M 11 D Return Phone Number: [phone] (Primary) Address: City/State/Zip: [place] Client [place] Night - Client Client Site [place] Physician Renford Dills Contact Type Call Call Type Triage / Clinical Relationship To Patient Self Return Phone Number [phone] (Primary) Chief Complaint Urination Pain Initial Comment Caller states she has a UTI. Nurse Assessment Nurse: Thad Ranger RN, Langley Gauss Date/Time (Eastern Time): 03/25/2014 2:28:17 PM Please select the assessment type ---Refill Additional Documentation ---Caller states she has a UTI. States she was treated with Cipro x 3 days at the end of the week and got a call from Dr Josefine Class office on Thurs and Lugine reported to her that she has multiple bacteria. States she finished the abx on Friday, felt better on Sat but then today she is symptomatic again with flank pain, dysuria, no fever. States the MDO should have called her in a week supply of the Cipro but did not do this and she demands  the MD be paged for further abx since she reports she feels it is a mistake of MDO. Allergies: Sulfa, Tramadol [facility] [place], [phone] Does the patient have enough medication to last until the office opens? ---No Additional Documentation ---Advised per profile, MD is not to be called for new meds and there are no SO for abx for UTI per med profile. Advised she would need to be eval in ER or UCC. Advised will page MD per pt insistence and cb. Verb understanding. Guidelines Guideline Title Affirmed Question Affirmed Notes Nurse Date/Time (Eastern Time) Disp. Time Eilene Ghazi Time) Disposition Final User 03/25/2014 2:32:02 PM Called On-Call Provider [place], RN, Langley Gauss 03/25/2014 2:34:38 PM Pharmacy Call Thad Ranger, RN, Langley Gauss Reason: [facility]/ [facility] for Cipro per MD order. 03/25/2014 2:41:18 PM Clinical Call Yes Carmon, RN, Langley Gauss PLEASE NOTE: All timestamps contained within this report are represented as [place] Standard Time. CONFIDENTIALTY NOTICE: This fax transmission is intended only for the addressee. It contains information that is legally privileged, confidential or otherwise protected from use or disclosure. If you are not the intended recipient, you are strictly prohibited from reviewing, disclosing, copying using or disseminating any of this information or taking any action in reliance on or regarding this information. If you have received this fax in error, please notify us immediately by telephone so that we can arrange for its return to [place]. Phone: [phone], Toll-Free: [phone], Fax: [phone] Page: 2 of 3 Call Id: [id number] After Care Instructions Given Call Event Type User Date / Time Description Verbal Orders/Maintenance Medications Medication Refill Route Dosage Regime Duration Admin Instructions User Name Cipro 500mg  po bid x 7 days, #14, No refills. N/A Carmon, RN, Langley Gauss Comments  User: Romeo Apple, RN Date/Time Eilene Ghazi Time):  03/25/2014 2:41:05 PM Returned call to the pt and advised Cipro 7 day [facility], increase fluid intake. States she is having some nausea with the Cipro and this is normal for her but she will be able to take it. States she will take her Zofran and Pyridium with the Cipro. Advised to cb if she has further questions/concerns. Verb understanding. Paging DoctorName DoctorPhone DateTime Result/Outcome Notes Owens Loffler [phone] 03/25/2014 2:32:02 PM Called On Call Provider - Reached [place], Frederico Hamman 03/25/2014 2:32:50 PM Spoke with On Call - General Pt report given to MD with VO recieved: Cipro 500mg  po bid x 7 days, #14, No refills. PLEASE NOTE: All timestamps contained within this report are represented as [place] Standard Time. CONFIDENTIALTY NOTICE: This fax transmission is intended only for the addressee. It contains information that is legally privileged, confidential or otherwise protected from use or disclosure. If you are not the intended recipient, you are strictly prohibited from reviewing, disclosing, copying using or disseminating any of this information or taking any action in reliance on or regarding this information. If you have received this fax in error, please notify us immediately by telephone so that we can arrange for its return to [place]. Phone: [phone], Toll-Free: [phone], Fax: [phone] Page: 3 of 3 Call Id: [phone] [facility] [address] [place] [address] [phone] Fax: [phone] [place] Night - Client [place] - Night Date: 03/25/2014 From: QI Department To: Renford Dills Please sign the order for the approved drug(s) given by our call center nurse on your behalf. Fax to [phone] within 5 business days. Thank you. Date Eilene Ghazi Time): 03/25/2014 2:09:29 PM Triage RN: Romeo Apple, RN NAME:  Hannah [name] PHONE NUMBER: [phone] (Primary) BIRTHDATE: Sep 23, 1970 ADDRESS: CITY/STATE/ZIP: [place] CALLER: Self NAME: Rx Given Medication Refill Route Dosage Regime Duration Admin Instructions Cipro 500mg  po bid x 7 days, #14, No refills. N/A MD Signature Date

## 2014-04-27 NOTE — Op Note (Signed)
PATIENT NAME:  Hannah [name], Hannah [name] MR#:  [id number] DATE OF BIRTH:  1970-12-09  DATE OF PROCEDURE:  05/03/2012  PRINCIPAL DIAGNOSES: 1. Urinary frequency.  2. Urge incontinence.  3. Interstitial cystitis.   POSTOPERATIVE DIAGNOSES: 1. Urinary frequency.  2. Urge incontinence.  3. Interstitial cystitis.   PROCEDURE:  1. Cystoscopy.  2. Bladder Botox injection.   SURGEON: Edrick Oh, MD   ANESTHESIA: Laryngeal mask airway anesthesia.   INDICATIONS: The patient is a 44 year old white female with a long history of interstitial cystitis, urinary frequency, urgency and urge component incontinence. She has been tried on multiple medical options with minimal success. She presents for bladder Botox injection.   DESCRIPTION OF PROCEDURE: After informed consent was obtained, the patient was taken to the operating room and placed in the dorsal lithotomy position under laryngeal mask airway anesthesia. The patient was then prepped and draped in the usual standard fashion. The 22-French rigid cystoscope was introduced into the urethra under direct vision with no urethral abnormalities noted. Upon entering the bladder, the mucosa was inspected in its entirety with no gross mucosal lesions noted. Bilateral ureteral orifices were well visualized with no lesions noted. Mild squamous metaplasia was noted at the distal trigone. An area of scar was noted at the dome of the bladder. The 100 units of Botox was reconstituted per routine protocol. The injection needle was placed through the cystoscope. Injection was begun on the right posterior wall. Four rows of 5 injections were placed along the posterior aspect of the bladder wall in the standard fashion. No significant bleeding or other abnormalities were encountered. Once the injection was complete, the bladder was drained, the cystoscope was removed. An 18-French red rubber catheter was inserted into the urinary bladder, 40 mL of 2% lidocaine was instilled into the  urinary bladder, the catheter was then removed. The patient was returned to the supine position and awakened from laryngeal mask airway anesthesia. She was taken to the recovery room in stable condition. There were no problems or complications. The patient tolerated the procedure well.     ____________________________ Denice Bors. Jacqlyn Larsen, MD bsc:es D: 05/03/2012 10:44:46 ET T: 05/03/2012 11:41:42 ET JOB#: [id number]  cc: Denice Bors. Jacqlyn Larsen, MD, <Dictator> Denice Bors Vennela Jutte MD ELECTRONICALLY SIGNED 05/03/2012 17:22

## 2014-04-28 NOTE — H&P (Signed)
   Subjective/Chief Complaint Epigastric/periumbilical, now RLQ pain x 24 hours, N/V   History of Present Illness [name] is a pleasant 44 yo F who presents with 1 day of abdominal pain, nausea/vomitnig. She says that her pain began yesterday evening following dinner.  Iniitally epigastric/supraumbilical.  Associated with N/V.  Became more diffuse then progressed to RLQ.  Subjective fevers.  Has never had this pain before.  "pasty" stools x 24 hours and dark urine.   Past History H/o cholecystectomy H/o interstitial cystitis Anemia Bronchitis H/o C section/hysterectomy/oorphorectomy Tonsillectomy  Migranes Lumbar disc surgery   Code Status Full Code   Past Med/Surgical Hx:  Arthritis:   Anemia: h/o  Bladder Spasms:   Bronchitis:   Migraines:   Tonsillectomy: age 44  Cystoscopy with bladder biopsy:   Cesarean Section:   Lumbar Disc Surgery:   Hysterectomy - Total:   Cholecystectomy:   ALLERGIES:  Sulfa: Hives  Ultram: N/V/Diarrhea  Family and Social History:  Family History Non-Contributory   Social History negative tobacco, negative ETOH   Place of Living Home  Here with husband   Review of Systems:  Subjective/Chief Complaint RLQ pain, N/V   Fever/Chills Yes   Cough No   Sputum No   Abdominal Pain Yes   Diarrhea No   Constipation No   Nausea/Vomiting Yes   SOB/DOE No   Chest Pain No   Dysuria No   Tolerating PT No   Tolerating Diet No  Nauseated  Vomiting   Physical Exam:  GEN well developed, well nourished, no acute distress   HEENT pink conjunctivae, PERRL   RESP normal resp effort  clear BS  no use of accessory muscles   CARD regular rate  no murmur  no thrills   ABD positive tenderness  no liver/spleen enlargement  no hernia  soft  normal BS  RLQ peritonitis, + rovsigs   EXTR negative cyanosis/clubbing, negative edema   SKIN normal to palpation, No rashes, No ulcers   NEURO cranial nerves intact, negative rigidity, negative  tremor, strength:, motor/sensory function intact   PSYCH A+O to time, place, person, good insight    Assessment/Admission Diagnosis 44 yo F with acute onset periumbilical progressing to RLQ pain, N/V.  Leukocytosis, dilated appendix on CT with periappendiceal stranding.  Clinical and radiographic appendicitis.   Plan Per Dr. Pat Patrick.  Likely OR for appendectomy.   Electronic Signatures: Floyde Parkins (MD)  (Signed 24-Oct-15 18:54)  Authored: CHIEF COMPLAINT and HISTORY, PAST MEDICAL/SURGIAL HISTORY, ALLERGIES, FAMILY AND SOCIAL HISTORY, REVIEW OF SYSTEMS, PHYSICAL EXAM, ASSESSMENT AND PLAN   Last Updated: 24-Oct-15 18:54 by Floyde Parkins (MD)

## 2014-04-28 NOTE — Discharge Summary (Signed)
PATIENT NAME:  Hannah, [name] MR#:  [id number] DATE OF BIRTH:  1970/08/12  DATE OF ADMISSION:  10/28/2013 DATE OF DISCHARGE:  10/30/2013  BRIEF HISTORY: Hannah [name] is a 43 year old woman seen in the Emergency Room with 12-18 history of abdominal pain, primarily right lower quadrant. The pain began as periumbilical pain migrating to the right lower quadrant. Her symptoms worsened, accompanied with some mild nausea and vomiting. She has had chronic nausea problems. She was seen in the Emergency Room with an elevated white blood cell count of 14,400. CT scan demonstrated some thickening of the appendix and periappendiceal fluid consistent with probable acute appendicitis. She was taken to surgery urgently that evening where she underwent a laparoscopic appendectomy. The procedure was uncomplicated. She had no significant intraoperative problems. She did appear to have a nonruptured appendicitis, although pathology is pending. She had slow return of appetite. She had some mild nausea without vomiting. Pain control was reasonable at this point. This morning, she is up, active, with no complaints. She was discharged home this morning to follow up in the office in 7 to 10 days' time. Bathing, activity, and driving instructions were given to the patient.   DISCHARGE MEDICATIONS: Include Xanax 0.5 mg b.i.d. p.r.n., [place] Transderm Film 0.05 mg twice weekly, Elmiron 100 mg b.i.d., Zomig 5 mg once a day, melatonin 3 mg p.r.n. at bedtime, Phenergan 25 mg every six hours p.r.n. She was discharged with prescriptions for Percocet 5/325 every 6 hours p.r.n., Anusol hydrocortisone 25 mg b.i.d. p.r.n., Zofran 8 mg p.o. q.8 hours p.r.n.   FINAL DISCHARGE DIAGNOSIS: Acute appendicitis.   SURGERY: Laparoscopic appendectomy.     ____________________________ Rodena Goldmann III, MD rle:jp D: 10/30/2013 06:41:34 ET T: 10/30/2013 09:25:33 ET JOB#: [id number]  cc: Micheline Maze, MD, <Dictator> Elveria Rising. Damita Dunnings,  MD Rodena Goldmann MD ELECTRONICALLY SIGNED 11/06/2013 18:04

## 2014-04-28 NOTE — Op Note (Signed)
PATIENT NAME:  Hannah [name], Hannah [name] MR#:  [id number] DATE OF BIRTH:  December 28, 1970  DATE OF PROCEDURE:  10/28/2013  PREOPERATIVE DIAGNOSIS: Acute appendicitis.   POSTOPERATIVE DIAGNOSIS: Acute appendicitis.   OPERATION: Laparoscopic appendectomy.   ANESTHESIA: General.   SURGEON: Rodena Goldmann, MD   OPERATIVE PROCEDURE: With the patient in the supine position, after induction of appropriate general anesthesia, the patient's abdomen was prepped with ChloraPrep and draped with sterile towels. The patient was placed in head down, feet up position. A small transverse infraumbilical incision was made at the site of her previous surgery. A Veress needle was used to cannulate the peritoneal cavity. CO2 was insufflated to appropriate pressure measurements. When approximately 2 liters of CO2 were instilled, the Veress needle was withdrawn. An 11 mm [employer] port was inserted in the peritoneal cavity. Intraperitoneal position was confirmed. CO2 was reinsufflated. Marcaine 0.25% was injected in the midepigastric area and a transverse incision was made in that area. An 11 mm port was inserted under direct vision. The right lower quadrant was investigated. The appendix appeared inflamed, thickened, and injected. A suprapubic transverse incision was made through a previous anesthetized area and a 12 mm port inserted under direct vision. The camera was moved to the upper port and dissection carried out through the 2 lower ports. The appendix was identified without difficulty. It did appear to be acutely inflamed. There was no evidence of any perforation or sclerosis. The mesoappendix was divided with a single application of the Endo GIA stapling device carrying a white load. The base of the appendix was divided with a single application of the Endo GIA stapler carrying a blue load. There did not appear to be any stump remnant. The appendix was captured in an Endo Catch apparatus and removed through the lower incision. The  area was then copiously suctioned and irrigated with 2 liters of warm saline solution. Lower midline incision was closed with figure-of-eight suture of 0 Vicryl using the suture passer. The area was infiltrated with 0.25% Marcaine for postoperative pain control. The skin incisions were all closed with 5-0 nylon. Sterile dressings were applied. The patient was returned to the recovery room having tolerated the procedure well. Sponge, instrument, needle counts were correct x 2 in the Operating Room.    ____________________________ Rodena Goldmann III, MD rle:ts D: 10/28/2013 21:31:12 ET T: 10/29/2013 18:28:44 ET JOB#: [id number]  cc: Rodena Goldmann III, MD, <Dictator> Rodena Goldmann MD ELECTRONICALLY SIGNED 10/29/2013 20:15

## 2014-04-30 LAB — SURGICAL PATHOLOGY

## 2014-06-19 ENCOUNTER — Ambulatory Visit (INDEPENDENT_AMBULATORY_CARE_PROVIDER_SITE_OTHER): Payer: BLUE CROSS/BLUE SHIELD | Admitting: Primary Care

## 2014-06-19 ENCOUNTER — Encounter: Payer: Self-pay | Admitting: Primary Care

## 2014-06-19 VITALS — BP 116/76 | HR 95 | Temp 98.1°F | Ht 67.0 in | Wt 188.8 lb

## 2014-06-19 DIAGNOSIS — R350 Frequency of micturition: Secondary | ICD-10-CM

## 2014-06-19 LAB — POCT URINALYSIS DIPSTICK
BILIRUBIN UA: NEGATIVE
Blood, UA: NEGATIVE
Glucose, UA: NEGATIVE
KETONES UA: NEGATIVE
LEUKOCYTES UA: NEGATIVE
Nitrite, UA: NEGATIVE
Protein, UA: NEGATIVE
SPEC GRAV UA: 1.025
UROBILINOGEN UA: NEGATIVE
pH, UA: 6

## 2014-06-19 MED ORDER — CEPHALEXIN 500 MG PO CAPS: 500.0000 mg | ORAL_CAPSULE | Freq: Two times a day (BID) | ORAL | Status: DC

## 2014-06-19 NOTE — Progress Notes (Signed)
Pre visit review using our clinic review tool, if applicable. No additional management support is needed unless otherwise documented below in the visit note. 

## 2014-06-19 NOTE — Patient Instructions (Addendum)
Your urine test did not show infection. Hold the Cephalexin antibiotic and fill if symptoms worse. Take 1 tablet by mouth twice daily for 7 days. Push intake of water. You may take tylenol/ibuprofen for fevers and body aches. I will call you with the results of your urine culture.  It was very nice meeting you!

## 2014-06-19 NOTE — Progress Notes (Signed)
Subjective:    Patient ID: Hannah [name], female    DOB: 25-Jul-1970, 44 y.o.   MRN: [id number]  HPI  Hannah [name] is a 44 year old female who presents today with a chief complaint of urinary frequency. She has a history of Interstitial cystitis and will have 2-3 UTI's annually. Sunday after church she started feeling tired, low back pain, and feeling "off". Yesterday she began feeling feverish, tired, increased urinary frequency, pelvic pain, and nausea. Her symptoms over the past several days do not feel like her typical "twinges" and discomfort that presents with an IC flare-up. She has felt "sicker quicker" with this recent episode and would like to be treated with antibiotics.  Review of Systems  Constitutional:       Felt feverish  Respiratory: Negative for shortness of breath.   Cardiovascular: Negative for chest pain.  Genitourinary: Positive for urgency, frequency and pelvic pain. Negative for dysuria and vaginal discharge.       Past Medical History  Diagnosis Date  . Hyperlipidemia   . Other chronic cystitis   . Diverticulosis of colon (without mention of hemorrhage)   . Gross hematuria   . Neoplasm of uncertain behavior of bladder   . Other symptoms involving urinary system(788.99)   . IC (interstitial cystitis)     per Dr. Jacqlyn [name]  . Calculus of kidney     History   Social History  . Marital Status: Married    Spouse Name: N/A  . Number of Children: 0  . Years of Education: N/A   Occupational History  . 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 Smoking status: Never Smoker   . Smokeless tobacco: Never Used  . Alcohol Use: No  . Drug Use: No  . Sexual Activity: Not on file   Other Topics Concern  . Not on file   Social History Narrative    Past Surgical History  Procedure Laterality Date  . Abdominal hysterectomy  10/1999  . Cholecystectomy  10/23/2005    Lap  . Physical therapy  12/1997; 01/06/1998;  03/1998  . Laparoscopy abdomen diagnostic  07/1999    exploration adhesions to ovaries  . Ct abd w & pelvis wo cm  10/09    Thickening and irreg dome of bladder.  Diverticulosis  . Cystoscopy/bladder bx  11/03/07    Path...inflammation.  Hydrodistention Excoriations (Dr. Jacqlyn [name])   . Lumbar spine surgery    . Bilateral salpingoophorectomy    . Laparoscopic appendectomy  10/2013    [facility]    Family History  Problem Relation Age of Onset  . Cancer Neg Hx   . Colon polyps Hannah [name]     Hannah [name] with polyps before age 44    Allergies  Allergen Reactions  . Sulfamethoxazole-Trimethoprim Hives  . Sulfonamide Derivatives Hives  . Tramadol Hives    vomiting    Current Outpatient Prescriptions on File Prior to Visit  Medication Sig Dispense Refill  . albuterol (PROVENTIL HFA;VENTOLIN HFA) 108 (90 BASE) MCG/ACT inhaler Inhale 1-2 puffs into the lungs every 4 (four) hours as needed. For shortness of breath or wheezing 18 g 1  . butalbital-aspirin-caffeine (FIORINAL) 50-325-40 MG per capsule Take 1 capsule by mouth 2 (two) times daily as needed for headache. 30 capsule 1  . cyclobenzaprine (FLEXERIL) 10 MG tablet Take 10 mg by mouth 3 (three) times daily as needed. For muscle spasms    . estradiol (VIVELLE-DOT) 0.0375 MG/24HR Place  1 patch onto the skin 2 (two) times a week.    . lidocaine (LIDODERM) 5 % APPLY 1 PATCH DAILY 12 HOURS ON 12 HOURS OFF 30 patch 1  . pentosan polysulfate (ELMIRON) 100 MG capsule Take 100 mg by mouth 2 (two) times daily.    . phenazopyridine (PYRIDIUM) 200 MG tablet Take 1 tablet (200 mg total) by mouth 3 (three) times daily as needed for pain. 10 tablet 1  . valACYclovir (VALTREX) 500 MG tablet Take 500 mg by mouth 3 (three) times daily as needed. For fever blisters    . zolmitriptan (ZOMIG) 5 MG tablet Take 0.5-1 tablets (2.5-5 mg total) by mouth as needed for migraine. Take 0.5 at onset of migraine, may repeat in 2 hours if not resolved.  Note:  Limit of 2 in 24 hours.  10 tablet 1   No current facility-administered medications on file prior to visit.    BP 116/76 mmHg  Pulse 95  Temp(Src) 98.1 F (36.7 C) (Oral)  Ht 5\' 7"  (1.702 m)  Wt 188 lb 12.8 oz (85.639 kg)  BMI 29.56 kg/m2  SpO2 97%    Objective:   Physical Exam  Constitutional: She is oriented to person, place, and time.  Cardiovascular: Normal rate and regular rhythm.   Pulmonary/Chest: Effort normal and breath sounds normal.  Abdominal: Soft. Bowel sounds are normal. There is CVA tenderness.  Tender to suprapubic region  Neurological: She is alert and oriented to person, place, and time.  Skin: Skin is warm and dry.          Assessment & Plan:  Urinary Frequency:  UA: Negative for leuks, nitrites, blood, glucose. Will send culture. History of IC, this episode feels different than her typical IC flare-up. Feeling tired, weak, feverish, flank pain, frequency. RX printed for Cephalexin 500 BID x 7 days to fill if symptoms worsen while she's on vacation (leaving in a few days). Follow up PRN.

## 2014-06-20 ENCOUNTER — Telehealth: Payer: Self-pay

## 2014-06-20 NOTE — Telephone Encounter (Signed)
Pt left v/m requesting cb with culture results when available; pt still holding rx and pt is not feeling well but does not want to start abx until gets culture results back. Pt request cb ASAP.

## 2014-06-20 NOTE — Telephone Encounter (Signed)
Called and notified patient of Kate's comments. Patient verbalized understanding.  

## 2014-06-20 NOTE — Telephone Encounter (Signed)
Please notify [name] that I do not have the results of her urine culture at this time. We will be sure to notify her of the urine culutre once it has resulted. Thanks

## 2014-06-21 LAB — URINE CULTURE
Colony Count: NO GROWTH
Organism ID, Bacteria: NO GROWTH

## 2014-07-12 ENCOUNTER — Encounter: Payer: Self-pay | Admitting: Family Medicine

## 2014-10-01 ENCOUNTER — Ambulatory Visit: Payer: BLUE CROSS/BLUE SHIELD | Attending: Orthopedic Surgery | Admitting: Physical Therapy

## 2014-10-01 DIAGNOSIS — M25571 Pain in right ankle and joints of right foot: Secondary | ICD-10-CM | POA: Diagnosis present

## 2014-10-01 DIAGNOSIS — R262 Difficulty in walking, not elsewhere classified: Secondary | ICD-10-CM | POA: Diagnosis present

## 2014-10-01 DIAGNOSIS — R269 Unspecified abnormalities of gait and mobility: Secondary | ICD-10-CM | POA: Diagnosis present

## 2014-10-01 NOTE — Therapy (Signed)
[facility] [address]  [place], [place], [address] Phone: [phone]   Fax:  [phone]  Physical Therapy Evaluation  Patient Details  Name: Hannah [name] MRN: [id number] Date of Birth: [birth year] Referring Provider:  Wylene Simmer, MD  Encounter Date: 10/01/2014      PT End of Session - 10/01/14 0900    Visit Number 1   Number of Visits 12   Date for PT Re-Evaluation 10/29/14   PT Start Time 0843   PT Stop Time 0933   PT Time Calculation (min) 50 min      Past Medical History  Diagnosis Date  . Hyperlipidemia   . Other chronic cystitis   . Diverticulosis of colon (without mention of hemorrhage)   . Gross hematuria   . Neoplasm of uncertain behavior of bladder   . Other symptoms involving urinary system(788.99)   . IC (interstitial cystitis)     per Dr. Jacqlyn Larsen, s/p botox injection  . Calculus of kidney     Past Surgical History  Procedure Laterality Date  . Abdominal hysterectomy  10/1999  . Cholecystectomy  10/23/2005    Lap  . Physical therapy  12/1997; 01/06/1998; 03/1998  . Laparoscopy abdomen diagnostic  07/1999    exploration adhesions to ovaries  . Ct abd w & pelvis wo cm  10/09    Thickening and irreg dome of bladder.  Diverticulosis  . Cystoscopy/bladder bx  11/03/07    Path...inflammation.  Hydrodistention Excoriations (Dr. Jacqlyn Larsen)   . Lumbar spine surgery    . Bilateral salpingoophorectomy    . Laparoscopic appendectomy  10/2013    [facility]    There were no vitals filed for this visit.  Visit Diagnosis:  Right ankle pain  Difficulty walking  Abnormality of gait      Subjective Assessment - 10/01/14 0847    Subjective pt rolled R ankle at home a few months ago noting swelling and discoloration.  States she applied brace that she had and waited for it to improve.  She then went to beach and rolled ankle again while walking at beach in July noting further edema but denies much  discoloration.  She c/o pain extending from foot through lateral lower leg.  She went to Dr Doran Durand early Aug and was advised to perform stretching and some massage.  She states she has been doing this but doesn't seem to be improving.  MD had also recommended PT on 08/10/14.  Pt initially did not plan on attending PT but due to lack of progress is now here.   Currently in Pain? Yes   Pain Score --  states pain ranges from 4-5/10 on average with brace use and up to 8-9/10 at worst in the evening.   Pain Location Ankle   Pain Radiating Towards pt with c/o pain throughout R foot (mostly plantar surface) as well as posterior foot/ankle and continues into lateral lower leg.   Pain Onset More than a month ago   Pain Frequency Intermittent   Aggravating Factors  pain increases throughout the day   Pain Relieving Factors heat            [facility] PT Assessment - 10/01/14 0001    Assessment   Medical Diagnosis R Ankle [place]   Onset Date/Surgical Date 06/04/14   Next MD Visit PRN   Balance Screen   Has the patient fallen in the past 6 months No   Has the patient had a  decrease in activity level because of a fear of falling?  No   Is the patient reluctant to leave their home because of a fear of falling?  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   Observation/Other Assessments   Focus on Therapeutic Outcomes (FOTO)  70% limitation   Functional Tests   Functional tests Squat   Squat   Comments R navicular drop greater than L with R knee passing inside of R great toe   ROM / Strength   AROM / PROM / Strength Strength   Strength   Strength Assessment Site Ankle   Right/Left Ankle Right   Right Ankle Dorsiflexion 4+/5  anterior ankle pain   Right Ankle Inversion 4/5  medial foot pain   Right  Ankle Eversion 4/5  lateral ankle pain   Palpation   Palpation comment TTP peroneal tendons and plantar fascia; also TTP R abductor hallucis          TODAY'S TREATMENT: TherEx - reviewed need for AROM and stretching to R ankle and performed ankle alphabet Applied 4 strips Kinesiotape to R foot/ankle: 50% plantar fascia along Achilles, 70% stirrup, 50% along peroneal mms, 50% medial arch                 PT Education - 10/01/14 1030    Education provided Yes   Education Details more AROM to ankle, DF stretching   Person(s) Educated Patient   Methods Explanation;Demonstration   Comprehension Verbalized understanding;Returned demonstration          PT Short Term Goals - 10/01/14 1045    PT SHORT TERM GOAL #1   Title pt reports AVG pain decreases to 3/10 and worst pain to no greater than 6/10 by 10/15/14   Status New   PT SHORT TERM GOAL #2   Title pt independent with initial HEP by 10/15/14   Status New           PT Long Term Goals - 10/01/14 1030    PT LONG TERM GOAL #1   Title pt reports R foot/ankle pain is intermittent in nature rating no greater than 3/10 at worst and AVG 1-2/10 or better by 10/29/14   Status New   PT LONG TERM GOAL #2   Title R Ankle MMT 5/5 all planes without c/o pain with testing by 10/29/14   Status New   PT LONG TERM GOAL #3   Title pt able to ambulate with good mechanics throughout the day without limitation by pain or need for ankle brace by 10/29/14   Status New               Plan - 10/01/14 1035    Clinical Impression Statement pt with c/o R foot, ankle, and lower leg pain since rolling her ankle in May and again in July.  She states her pain increases throughout the day and rates 4-5/10 on average throughout the day but is up to 8-9/10 in the evenings.  Pain is located throughout R foot but tenderness with palpation noted along peroneal tendons which continues into muscles with high tone noted.  Tenderness also noted  R  abductor hallucis and to lesser extent within plantar fascia.  Seems pt with likely IV ankle sprain resulting in  peroneal tendon strain.  She also has medial foot and plantar foot pain which may be due to abnormal gait following ankle injury and seems plantar fasciitis and abductor hallucis strain in nature.  She displays high tone in calf muslces along with trigger points which likely contribute to extensive nature of her pain.  She should improve with PT with initial focus on pain reduction and normalizing tone then will address function and strength.   Pt will benefit from skilled therapeutic intervention in order to improve on the following deficits Pain;Decreased strength;Increased edema;Improper body mechanics;Decreased balance;Abnormal gait;Difficulty walking   Rehab Potential Good   PT Frequency 3x / week   PT Duration 4 weeks   PT Treatment/Interventions Therapeutic exercise;Manual techniques;Taping;Therapeutic activities;Iontophoresis 4mg /ml Dexamethasone;Vasopneumatic Device;Dry needling;Balance training;Patient/family education;Functional mobility training;Gait training;Electrical Stimulation;Cryotherapy;Ultrasound   PT Next Visit Plan DFM and TPR in R foot and lower leg with stretch to leg muscles; 20% [place] to peroneal tendons; may try needling in future appointments.  Foot/ankle taping.   Consulted and Agree with Plan of Care Patient         Problem List Patient Active Problem List   Diagnosis Date Noted  . Atypical chest pain 02/01/2013  . UTI (urinary tract infection) 11/03/2012  . LFT elevation 02/10/2012  . Back pain 06/19/2011  . Migraine with aura 06/19/2011  . Anxiety 02/23/2011  . Fever blister 11/25/2010  . HYPERCHOLESTEROLEMIA 06/14/2008  . NEPHROLITHIASIS 11/23/2007  . Interstitial cystitis 11/18/2007  . GROSS HEMATURIA 11/18/2007  . DIVERTICULOSIS OF COLON 10/26/2007  . NEOPLASM OF UNCERTAIN BEHAVIOR OF BLADDER 10/18/2007    HALL,RALPH PT, OCS 10/01/2014,  10:46 AM  [facility] [place], [place], [address] Phone: [phone]   Fax:  [phone]

## 2014-10-03 ENCOUNTER — Ambulatory Visit: Payer: BLUE CROSS/BLUE SHIELD | Admitting: Physical Therapy

## 2014-10-03 DIAGNOSIS — M25571 Pain in right ankle and joints of right foot: Secondary | ICD-10-CM | POA: Diagnosis not present

## 2014-10-03 DIAGNOSIS — R262 Difficulty in walking, not elsewhere classified: Secondary | ICD-10-CM

## 2014-10-03 DIAGNOSIS — R269 Unspecified abnormalities of gait and mobility: Secondary | ICD-10-CM

## 2014-10-03 NOTE — Therapy (Signed)
[facility] [address]  [place], [place], [address] Phone: [phone]   Fax:  [phone]  Physical Therapy Treatment  Patient Details  Name: Hannah [name] MRN: [id number] Date of Birth: 04-Mar-1970 Referring Provider:  Wylene Simmer, MD  Encounter Date: 10/03/2014      PT End of Session - 10/03/14 1319    Visit Number 2   Number of Visits 12   Date for PT Re-Evaluation 10/29/14   PT Start Time 2458   PT Stop Time 1407   PT Time Calculation (min) 52 min      Past Medical History  Diagnosis Date  . Hyperlipidemia   . Other chronic cystitis   . Diverticulosis of colon (without mention of hemorrhage)   . Gross hematuria   . Neoplasm of uncertain behavior of bladder   . Other symptoms involving urinary system(788.99)   . IC (interstitial cystitis)     per Dr. Jacqlyn Larsen, s/p botox injection  . Calculus of kidney     Past Surgical History  Procedure Laterality Date  . Abdominal hysterectomy  10/1999  . Cholecystectomy  10/23/2005    Lap  . Physical therapy  12/1997; 01/06/1998; 03/1998  . Laparoscopy abdomen diagnostic  07/1999    exploration adhesions to ovaries  . Ct abd w & pelvis wo cm  10/09    Thickening and irreg dome of bladder.  Diverticulosis  . Cystoscopy/bladder bx  11/03/07    Path...inflammation.  Hydrodistention Excoriations (Dr. Jacqlyn Larsen)   . Lumbar spine surgery    . Bilateral salpingoophorectomy    . Laparoscopic appendectomy  10/2013    [facility]    There were no vitals filed for this visit.  Visit Diagnosis:  Right ankle pain  Difficulty walking  Abnormality of gait      Subjective Assessment - 10/03/14 1316    Subjective states taped seemed to help in that pain not as intense at night and did not limp as bad.  States ankle seems more painful today and is rating 8/10 currently.   Currently in Pain? Yes   Pain Score 8    Pain Location Ankle           TODAY'S TREATMENT Manual  - STM and TPR throughout R calf mms with intermittent stretching into DF [place] - 2cm, 8', 0.5W/cm2, 3.3 MHz, 20% R peroneal tendons Ionto - 38mA/min Dex 4-6 hour patch to R peroneal tendons                        PT Short Term Goals - 10/03/14 1444    PT SHORT TERM GOAL #1   Title pt reports AVG pain decreases to 3/10 and worst pain to no greater than 6/10 by 10/15/14   Status On-going   PT SHORT TERM GOAL #2   Title pt independent with initial HEP by 10/15/14   Status On-going           PT Long Term Goals - 10/03/14 1445    PT LONG TERM GOAL #1   Title pt reports R foot/ankle pain is intermittent in nature rating no greater than 3/10 at worst and AVG 1-2/10 or better by 10/29/14   Status On-going   PT LONG TERM GOAL #2   Title R Ankle MMT 5/5 all planes without c/o pain with testing by 10/29/14   Status On-going   PT LONG TERM GOAL #3   Title pt able to  ambulate with good mechanics throughout the day without limitation by pain or need for ankle brace by 10/29/14   Status On-going               Plan - 10/03/14 1442    Clinical Impression Statement initial treatment tolerated fairly well but very TTP with nodules noted throughout calf mms. Initial ionto today so no tape today. Pt states she's willing to try dry needling next treatment.   PT Next Visit Plan DFM and TPR in R foot and lower leg with stretch to leg muscles; 20% [place] to peroneal tendons; may try needling in future appointments.  Foot/ankle taping.   Consulted and Agree with Plan of Care Patient        Problem List Patient Active Problem List   Diagnosis Date Noted  . Atypical chest pain 02/01/2013  . UTI (urinary tract infection) 11/03/2012  . LFT elevation 02/10/2012  . Back pain 06/19/2011  . Migraine with aura 06/19/2011  . Anxiety 02/23/2011  . Fever blister 11/25/2010  . HYPERCHOLESTEROLEMIA 06/14/2008  . NEPHROLITHIASIS 11/23/2007  . Interstitial cystitis 11/18/2007  . GROSS  HEMATURIA 11/18/2007  . DIVERTICULOSIS OF COLON 10/26/2007  . 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

## 2014-10-08 ENCOUNTER — Ambulatory Visit: Payer: BLUE CROSS/BLUE SHIELD | Attending: Orthopedic Surgery | Admitting: Physical Therapy

## 2014-10-08 DIAGNOSIS — R262 Difficulty in walking, not elsewhere classified: Secondary | ICD-10-CM | POA: Insufficient documentation

## 2014-10-08 DIAGNOSIS — R269 Unspecified abnormalities of gait and mobility: Secondary | ICD-10-CM

## 2014-10-08 DIAGNOSIS — M25571 Pain in right ankle and joints of right foot: Secondary | ICD-10-CM

## 2014-10-08 NOTE — Therapy (Signed)
[facility] [address]  [place], [place], [address] Phone: [phone]   Fax:  [phone]  Physical Therapy Treatment  Patient Details  Name: Hannah [name] MRN: [id number] Date of Birth: 12-07-1970 Referring Provider:  Wylene Simmer, MD  Encounter Date: 10/08/2014      PT End of Session - 10/08/14 1020    Visit Number 3   Number of Visits 12   Date for PT Re-Evaluation 10/29/14   PT Start Time 15   PT Stop Time 1108   PT Time Calculation (min) 49 min      Past Medical History  Diagnosis Date  . Hyperlipidemia   . Other chronic cystitis   . Diverticulosis of colon (without mention of hemorrhage)   . Gross hematuria   . Neoplasm of uncertain behavior of bladder   . Other symptoms involving urinary system(788.99)   . IC (interstitial cystitis)     per Dr. Jacqlyn Larsen, s/p botox injection  . Calculus of kidney     Past Surgical History  Procedure Laterality Date  . Abdominal hysterectomy  10/1999  . Cholecystectomy  10/23/2005    Lap  . Physical therapy  12/1997; 01/06/1998; 03/1998  . Laparoscopy abdomen diagnostic  07/1999    exploration adhesions to ovaries  . Ct abd w & pelvis wo cm  10/09    Thickening and irreg dome of bladder.  Diverticulosis  . Cystoscopy/bladder bx  11/03/07    Path...inflammation.  Hydrodistention Excoriations (Dr. Jacqlyn Larsen)   . Lumbar spine surgery    . Bilateral salpingoophorectomy    . Laparoscopic appendectomy  10/2013    [facility]    There were no vitals filed for this visit.  Visit Diagnosis:  Right ankle pain  Difficulty walking  Abnormality of gait      Subjective Assessment - 10/08/14 1021    Subjective states felt really good yesterday and believes in making progress since beginning PT.   Currently in Pain? Yes   Pain Score 7   6-7/10 today   Pain Location Ankle   Pain Orientation Right               TODAY'S TREATMENT Manual - STM and TPR to medial  aspect of R foot then medial calf in supine followed by lateral calf in prone (very TTP medial gastroc which extended into knee, also quite tending to medial foot, not much tenderness noted in lateral calf today which is a significant improvement) [place] - 2cm, 8', 0.5W/cm2, 3.3 MHz, 20% R medial foot Ionto #3/6 - 61mA/min Dex 4-6 hour patch to R medial ankle/foot (more pain/tenderness noted here)                         PT Short Term Goals - 10/03/14 1444    PT SHORT TERM GOAL #1   Title pt reports AVG pain decreases to 3/10 and worst pain to no greater than 6/10 by 10/15/14   Status On-going   PT SHORT TERM GOAL #2   Title pt independent with initial HEP by 10/15/14   Status On-going           PT Long Term Goals - 10/03/14 1445    PT LONG TERM GOAL #1   Title pt reports R foot/ankle pain is intermittent in nature rating no greater than 3/10 at worst and AVG 1-2/10 or better by 10/29/14   Status On-going   PT LONG TERM  GOAL #2   Title R Ankle MMT 5/5 all planes without c/o pain with testing by 10/29/14   Status On-going   PT LONG TERM GOAL #3   Title pt able to ambulate with good mechanics throughout the day without limitation by pain or need for ankle brace by 10/29/14   Status On-going               Plan - 10/08/14 1236    Clinical Impression Statement pt states she feels as though she is making progress with PT over the past week.  Noted a couple days of pain reduction following last PT session with manual focus so we opted to repeat that today rather than needling due to pt's apprehension with dry needling.  3rd ionto patch applied today.   PT Next Visit Plan DFM and TPR in R foot and lower leg with stretch to leg muscles; 20% [place] to peroneal vs medial foot tendons; may try needling in future appointments.  Foot/ankle taping.   Consulted and Agree with Plan of Care Patient        Problem List Patient Active Problem List   Diagnosis Date Noted  .  Atypical chest pain 02/01/2013  . UTI (urinary tract infection) 11/03/2012  . LFT elevation 02/10/2012  . Back pain 06/19/2011  . Migraine with aura 06/19/2011  . Anxiety 02/23/2011  . Fever blister 11/25/2010  . HYPERCHOLESTEROLEMIA 06/14/2008  . NEPHROLITHIASIS 11/23/2007  . Interstitial cystitis 11/18/2007  . GROSS HEMATURIA 11/18/2007  . DIVERTICULOSIS OF COLON 10/26/2007  . 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

## 2014-10-09 ENCOUNTER — Ambulatory Visit: Payer: BLUE CROSS/BLUE SHIELD | Admitting: Physical Therapy

## 2014-10-10 ENCOUNTER — Ambulatory Visit: Payer: BLUE CROSS/BLUE SHIELD | Admitting: Physical Therapy

## 2014-10-10 DIAGNOSIS — M25571 Pain in right ankle and joints of right foot: Secondary | ICD-10-CM | POA: Diagnosis not present

## 2014-10-10 DIAGNOSIS — R262 Difficulty in walking, not elsewhere classified: Secondary | ICD-10-CM

## 2014-10-10 DIAGNOSIS — R269 Unspecified abnormalities of gait and mobility: Secondary | ICD-10-CM

## 2014-10-10 NOTE — Therapy (Signed)
[facility] [address]  [place], [place], [address] Phone: [phone]   Fax:  [phone]  Physical Therapy Treatment  Patient Details  Name: Hannah [name] MRN: [id number] Date of Birth: 05/29/1970 Referring Provider:  Wylene Simmer, MD  Encounter Date: 10/10/2014      PT End of Session - 10/10/14 1100    Visit Number 4   Number of Visits 12   Date for PT Re-Evaluation 10/29/14   PT Start Time 1100   PT Stop Time 1215   PT Time Calculation (min) 75 min      Past Medical History  Diagnosis Date  . Hyperlipidemia   . Other chronic cystitis   . Diverticulosis of colon (without mention of hemorrhage)   . Gross hematuria   . Neoplasm of uncertain behavior of bladder   . Other symptoms involving urinary system(788.99)   . IC (interstitial cystitis)     per Dr. Jacqlyn Larsen, s/p botox injection  . Calculus of kidney     Past Surgical History  Procedure Laterality Date  . Abdominal hysterectomy  10/1999  . Cholecystectomy  10/23/2005    Lap  . Physical therapy  12/1997; 01/06/1998; 03/1998  . Laparoscopy abdomen diagnostic  07/1999    exploration adhesions to ovaries  . Ct abd w & pelvis wo cm  10/09    Thickening and irreg dome of bladder.  Diverticulosis  . Cystoscopy/bladder bx  11/03/07    Path...inflammation.  Hydrodistention Excoriations (Dr. Jacqlyn Larsen)   . Lumbar spine surgery    . Bilateral salpingoophorectomy    . Laparoscopic appendectomy  10/2013    [facility]    There were no vitals filed for this visit.  Visit Diagnosis:  Right ankle pain  Difficulty walking  Abnormality of gait      Subjective Assessment - 10/10/14 1101    Subjective states was sore for rest of day following last treatment but overall states frequency of pain has decreased since beginning PT.  cc pain today is lateral lower leg and forefoot/metatarsal area of #2-4.   Currently in Pain? Yes   Pain Score 7    Pain Location Ankle   Pain Orientation Right   Aggravating Factors  toe-off during gait produces lateral foot and leg pain            TODAY'S TREATMENT Manual - pt seated L metatarsal distraction with grade 2 AP mobes Pt prone DFM through plantar aspect of peroneus longus  [place] - 20%, 2cm, 0.5W/cm2, 3.3Hz , 9' through L peroneus longus tendon  Ionto #4/6: 67mA/min dex into L lateral aspect of plantar foot over peroneus longus tendon  Manual - (performed during ionto) - STM along L peroneal mms from lateral malleolus to fib head.   Kinesiotape to L foot/ankle - 3 strips 50% each: gastroc, peroneals, posterior tib                       PT Short Term Goals - 10/03/14 1444    PT SHORT TERM GOAL #1   Title pt reports AVG pain decreases to 3/10 and worst pain to no greater than 6/10 by 10/15/14   Status On-going   PT SHORT TERM GOAL #2   Title pt independent with initial HEP by 10/15/14   Status On-going           PT Long Term Goals - 10/03/14 1445    PT LONG TERM GOAL #1  Title pt reports R foot/ankle pain is intermittent in nature rating no greater than 3/10 at worst and AVG 1-2/10 or better by 10/29/14   Status On-going   PT LONG TERM GOAL #2   Title R Ankle MMT 5/5 all planes without c/o pain with testing by 10/29/14   Status On-going   PT LONG TERM GOAL #3   Title pt able to ambulate with good mechanics throughout the day without limitation by pain or need for ankle brace by 10/29/14   Status On-going               Plan - 10/10/14 1202    Clinical Impression Statement pt notes decreased frequency of pain in L foot/leg but states still in 7/10 range.  States pain noted with first few steps after sit->stand is rare now.  Plantar foot pain seems peroneus longus tendon but could be in part metatarsalgia.  [place] and manual to the area today and pt states this seems ideal as noted reproduction of some her symptoms for first time other than with walking.   PT Next Visit Plan  DFM and TPR throughout peroneal tendons/muscles, [place] to tendons, taping PRN, 2 more ionto treatments, may try needling in future appointments.   Consulted and Agree with Plan of Care Patient        Problem List Patient Active Problem List   Diagnosis Date Noted  . Atypical chest pain 02/01/2013  . UTI (urinary tract infection) 11/03/2012  . LFT elevation 02/10/2012  . Back pain 06/19/2011  . Migraine with aura 06/19/2011  . Anxiety 02/23/2011  . Fever blister 11/25/2010  . HYPERCHOLESTEROLEMIA 06/14/2008  . NEPHROLITHIASIS 11/23/2007  . Interstitial cystitis 11/18/2007  . GROSS HEMATURIA 11/18/2007  . DIVERTICULOSIS OF COLON 10/26/2007  . 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

## 2014-10-12 ENCOUNTER — Ambulatory Visit: Payer: BLUE CROSS/BLUE SHIELD | Admitting: Physical Therapy

## 2014-10-12 DIAGNOSIS — R269 Unspecified abnormalities of gait and mobility: Secondary | ICD-10-CM

## 2014-10-12 DIAGNOSIS — M25571 Pain in right ankle and joints of right foot: Secondary | ICD-10-CM | POA: Diagnosis not present

## 2014-10-12 DIAGNOSIS — R262 Difficulty in walking, not elsewhere classified: Secondary | ICD-10-CM

## 2014-10-12 NOTE — Therapy (Signed)
[facility] [address]  [place], [place], [address] Phone: [phone]   Fax:  [phone]  Physical Therapy Treatment  Patient Details  Name: Hannah [name] MRN: [id number] Date of Birth: 09-Nov-1970 Referring Provider:  Wylene Simmer, MD  Encounter Date: 10/12/2014      PT End of Session - 10/12/14 1109    Visit Number 5   Number of Visits 12   Date for PT Re-Evaluation 10/29/14   PT Start Time 1105   PT Stop Time 1200   PT Time Calculation (min) 55 min      Visit Diagnosis:  Right ankle pain  Difficulty walking  Abnormality of gait      Subjective Assessment - 10/12/14 1109    Subjective pt noted some bruising to R medial lower leg after removing tape yesterday.  States has felt better since last treatment and she is not aware what bruising is from.  She receives deep tissue work in the past without noting bruising so this is unlikely cause.  She states pain has decreased from 7-8/10 to 6/10 today.   Currently in Pain? Yes   Pain Score 6    Pain Location Ankle   Pain Orientation Right       TODAY'S TREATMENT Manual - Pt prone grade 2 forefoot and mid foot distraction mobes, grade 2 PA talocrural mobes, gentle STM through plantar aspect of peroneus longus  [place] - 20%, 2cm, 0.5W/cm2, 3.3Hz , 10' through L peroneus longus tendon  Vasopneumatic Compression - Medium Pressure, 40dg, 15' to R foot/ankle          PT Short Term Goals - 10/03/14 1444    PT SHORT TERM GOAL #1   Title pt reports AVG pain decreases to 3/10 and worst pain to no greater than 6/10 by 10/15/14   Status On-going   PT SHORT TERM GOAL #2   Title pt independent with initial HEP by 10/15/14   Status On-going           PT Long Term Goals - 10/03/14 1445    PT LONG TERM GOAL #1   Title pt reports R foot/ankle pain is intermittent in nature rating no greater than 3/10 at worst and AVG 1-2/10 or better by 10/29/14   Status  On-going   PT LONG TERM GOAL #2   Title R Ankle MMT 5/5 all planes without c/o pain with testing by 10/29/14   Status On-going   PT LONG TERM GOAL #3   Title pt able to ambulate with good mechanics throughout the day without limitation by pain or need for ankle brace by 10/29/14   Status On-going            Plan - 10/12/14 1148    Clinical Impression Statement Pt with bruising to medial aspect of R lower leg (medial to Achilles and to lesser extent to lateral aspect) along with mild increase in edema to lower leg and foot today.  Unknown reason for this.  Pt seen here 2 days ago and treatment consisted of 20% [place] along peroneus longus tendon, soft tissue work to peroneal muscles,and 95mA/min Dex to lateral aspect of plantar foot.  Today, pt initially pt states pain was 6/10 but then pain increased quickly during mobility assessment of foot and ankle.  Pt very painful with compression or distraction to R forefoot/metatarsals, pain with anterior drawer but pain is noted in bottom of foot and shoots posterior, medial foot/ankle pain with  wt bearing.  Chief pain at rest is in area of #2-4 metatarsals.  Palpation reveals tenderness along peroneus longus tendon and muscle as well as tenderness to anteriolateral aspect of talocrural joint.  Overall appears pt with chief pain related to peroneal tendon injury/strain but given amount of mid and forefoot pain with joint mobes, wt bearing, or special testing there is concern for ligament injury as well (would say bone but pt states all x-rays have been normal).  Pt unable to tolerate enough strain with testing to truly assess stability of foot and ankle.  Given how far out she is from injury and still with frequent 7-8/10 pain and due to random appearance of bruising to lower leg since yesterday, there is concern for persistent injury to foot/ankle and she may benefit from further assessment by MD.  I will therefore forward today's PT note to Dr. Doran Durand.   PT  Next Visit Plan treat as able with manual and modalities and add stability once able.   Consulted and Agree with Plan of Care Patient       [facility] PT, OCS 10/12/2014, 12:18 PM  [facility] [address]  [place], [place], [address] Phone: [phone]   Fax:  [phone]

## 2014-10-15 ENCOUNTER — Ambulatory Visit: Payer: BLUE CROSS/BLUE SHIELD | Admitting: Physical Therapy

## 2014-10-15 DIAGNOSIS — R262 Difficulty in walking, not elsewhere classified: Secondary | ICD-10-CM

## 2014-10-15 DIAGNOSIS — R269 Unspecified abnormalities of gait and mobility: Secondary | ICD-10-CM

## 2014-10-15 DIAGNOSIS — M25571 Pain in right ankle and joints of right foot: Secondary | ICD-10-CM

## 2014-10-15 NOTE — Therapy (Signed)
[facility] [address]  [place], [place], [address] Phone: [phone]   Fax:  [phone]  Physical Therapy Treatment  Patient Details  Name: Hannah [name] MRN: [id number] Date of Birth: May 02, 1970 Referring Provider:  Wylene Simmer, MD  Encounter Date: 10/15/2014      PT End of Session - 10/15/14 1454    Visit Number 6   Number of Visits 12   Date for PT Re-Evaluation 10/29/14   PT Start Time 2924   PT Stop Time 1531   PT Time Calculation (min) 38 min      Past Medical History  Diagnosis Date  . Hyperlipidemia   . Other chronic cystitis   . Diverticulosis of colon (without mention of hemorrhage)   . Gross hematuria   . Neoplasm of uncertain behavior of bladder   . Other symptoms involving urinary system(788.99)   . IC (interstitial cystitis)     per Dr. Jacqlyn Larsen, s/p botox injection  . Calculus of kidney     Past Surgical History  Procedure Laterality Date  . Abdominal hysterectomy  10/1999  . Cholecystectomy  10/23/2005    Lap  . Physical therapy  12/1997; 01/06/1998; 03/1998  . Laparoscopy abdomen diagnostic  07/1999    exploration adhesions to ovaries  . Ct abd w & pelvis wo cm  10/09    Thickening and irreg dome of bladder.  Diverticulosis  . Cystoscopy/bladder bx  11/03/07    Path...inflammation.  Hydrodistention Excoriations (Dr. Jacqlyn Larsen)   . Lumbar spine surgery    . Bilateral salpingoophorectomy    . Laparoscopic appendectomy  10/2013    [facility]    There were no vitals filed for this visit.  Visit Diagnosis:  Right ankle pain  Difficulty walking  Abnormality of gait      Subjective Assessment - 10/15/14 1454    Subjective states pain increased to 8/10 after last treatment and was 7-8/10 most of the weekend.  She states she has forgotten to take any pain meds today and is currently 7/10 with c/o tightness/cramping sensation to lateral lower leg.   Currently in Pain? Yes   Pain Score 7     Pain Location Ankle   Pain Orientation Right          TODAY'S TREATMENT Manual - Pt prone deep soft tissue work throughout R peroneal muscles and some DFM in peroneal tendons.  Still with mild edema and bruising to medial aspect of lower leg. [place] - 20%, 2cm, 0.5W/cm2, 8', 3.3MHz to R peroneus longus tendon           PT Short Term Goals - 10/03/14 1444    PT SHORT TERM GOAL #1   Title pt reports AVG pain decreases to 3/10 and worst pain to no greater than 6/10 by 10/15/14   Status On-going   PT SHORT TERM GOAL #2   Title pt independent with initial HEP by 10/15/14   Status On-going           PT Long Term Goals - 10/03/14 1445    PT LONG TERM GOAL #1   Title pt reports R foot/ankle pain is intermittent in nature rating no greater than 3/10 at worst and AVG 1-2/10 or better by 10/29/14   Status On-going   PT LONG TERM GOAL #2   Title R Ankle MMT 5/5 all planes without c/o pain with testing by 10/29/14   Status On-going   PT LONG TERM GOAL #3  Title pt able to ambulate with good mechanics throughout the day without limitation by pain or need for ankle brace by 10/29/14   Status On-going               Plan - 10/16/14 0825    Clinical Impression Statement pt still with some bruising to medial aspect of R lower leg - although bulk of pain still lateral and seems to be peroneal in location.  She reports benefit with deep tissue work to help normalize tone/pain.  We have discussed possible dry needling for same symptoms but she is fearful of needles.  She states she feels as though she is making some progress with PT to date, but overall still quite painful and does not tolerate joint mobes to foot/ankle due to c/o pain and her pain values range 6-8/10.  I am hoping she can schedule f/u with MD for further assessment and possible MRI.   PT Next Visit Plan continue to treat as able with manual and modalities; seated BAPS next treatment.   Consulted and Agree with Plan  of Care Patient        Problem List Patient Active Problem List   Diagnosis Date Noted  . Atypical chest pain 02/01/2013  . UTI (urinary tract infection) 11/03/2012  . LFT elevation 02/10/2012  . Back pain 06/19/2011  . Migraine with aura 06/19/2011  . Anxiety 02/23/2011  . Fever blister 11/25/2010  . HYPERCHOLESTEROLEMIA 06/14/2008  . NEPHROLITHIASIS 11/23/2007  . Interstitial cystitis 11/18/2007  . GROSS HEMATURIA 11/18/2007  . DIVERTICULOSIS OF COLON 10/26/2007  . 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  [place], [place], [address] Phone: [phone]   Fax:  [phone]

## 2014-10-17 ENCOUNTER — Ambulatory Visit: Payer: BLUE CROSS/BLUE SHIELD | Admitting: Physical Therapy

## 2014-10-17 DIAGNOSIS — R269 Unspecified abnormalities of gait and mobility: Secondary | ICD-10-CM

## 2014-10-17 DIAGNOSIS — R262 Difficulty in walking, not elsewhere classified: Secondary | ICD-10-CM

## 2014-10-17 DIAGNOSIS — M25571 Pain in right ankle and joints of right foot: Secondary | ICD-10-CM | POA: Diagnosis not present

## 2014-10-17 NOTE — Therapy (Signed)
[facility] [address]  [place], [place], [address] Phone: [phone]   Fax:  [phone]  Physical Therapy Treatment  Patient Details  Name: Hannah [name] MRN: [id number] Date of Birth: [birth year] Referring Provider:  Wylene Simmer, MD  Encounter Date: 10/17/2014      PT End of Session - 10/17/14 1056    Visit Number 7   Number of Visits 12   Date for PT Re-Evaluation 10/29/14   PT Start Time 1054   PT Stop Time 1149   PT Time Calculation (min) 55 min      Past Medical History  Diagnosis Date  . Hyperlipidemia   . Other chronic cystitis   . Diverticulosis of colon (without mention of hemorrhage)   . Gross hematuria   . Neoplasm of uncertain behavior of bladder   . Other symptoms involving urinary system(788.99)   . IC (interstitial cystitis)     per Dr. Jacqlyn Larsen, s/p botox injection  . Calculus of kidney     Past Surgical History  Procedure Laterality Date  . Abdominal hysterectomy  10/1999  . Cholecystectomy  10/23/2005    Lap  . Physical therapy  12/1997; 01/06/1998; 03/1998  . Laparoscopy abdomen diagnostic  07/1999    exploration adhesions to ovaries  . Ct abd w & pelvis wo cm  10/09    Thickening and irreg dome of bladder.  Diverticulosis  . Cystoscopy/bladder bx  11/03/07    Path...inflammation.  Hydrodistention Excoriations (Dr. Jacqlyn Larsen)   . Lumbar spine surgery    . Bilateral salpingoophorectomy    . Laparoscopic appendectomy  10/2013    [facility]    There were no vitals filed for this visit.  Visit Diagnosis:  Right ankle pain  Difficulty walking  Abnormality of gait      Subjective Assessment - 10/17/14 1053    Subjective States felt much better following last treatment and didn't note return of pain until yesterday afternoon.  States pain down to 5/10 following last treatment but back to 7/10 today.  States does feel as though more control/strength in foot/ankle lately.  States does  note decreased pain if wears shoes while at home.   Currently in Pain? Yes   Pain Score 7    Pain Location Ankle  foot/ankle   Pain Orientation Right           TODAY'S TREATMENT TherEx - Seated BAPS R LVL 4 CW/CCW 6x each; PF/DF 5# at P 10x; PF/DF 10x each with 2.5# at AL and AM DF Rocker Stretch 3x20"  Manual - Pt prone deep soft tissue work throughout R peroneal muscles and some DFM in peroneal tendons.  [place] - 20%, 2cm, 0.5W/cm2, 8', 3.3MHz to R peroneus longus tendon.  Kinesiotape to R foot/ankle: 3 strips; 75% stirrup, 50% peroneal mms, 50% plantar fascia and up Achilles to central gastroc                        PT Short Term Goals - 10/03/14 1444    PT SHORT TERM GOAL #1   Title pt reports AVG pain decreases to 3/10 and worst pain to no greater than 6/10 by 10/15/14   Status On-going   PT SHORT TERM GOAL #2   Title pt independent with initial HEP by 10/15/14   Status On-going           PT Long Term Goals - 10/03/14 1445    PT  LONG TERM GOAL #1   Title pt reports R foot/ankle pain is intermittent in nature rating no greater than 3/10 at worst and AVG 1-2/10 or better by 10/29/14   Status On-going   PT LONG TERM GOAL #2   Title R Ankle MMT 5/5 all planes without c/o pain with testing by 10/29/14   Status On-going   PT LONG TERM GOAL #3   Title pt able to ambulate with good mechanics throughout the day without limitation by pain or need for ankle brace by 10/29/14   Status On-going               Plan - 10/17/14 1153    Clinical Impression Statement pt noted decreased pain for a couple days following last treatment.  Added seated BAPS and DF stretch to today's session and seemed well tolerated.  She noted some pain during the exercises but states returned to baseline once stopped.  Performed same manual and [place] as last treatment and returned to addition of kinesiotape.  Will assess benefit of today's treatment at next f/u and progress /  adjust POC as necessary.   PT Next Visit Plan continue to treat as able with manual and modalities and progress stability exercises to tolerance   Consulted and Agree with Plan of Care Patient        Problem List Patient Active Problem List   Diagnosis Date Noted  . Atypical chest pain 02/01/2013  . UTI (urinary tract infection) 11/03/2012  . LFT elevation 02/10/2012  . Back pain 06/19/2011  . Migraine with aura 06/19/2011  . Anxiety 02/23/2011  . Fever blister 11/25/2010  . HYPERCHOLESTEROLEMIA 06/14/2008  . NEPHROLITHIASIS 11/23/2007  . Interstitial cystitis 11/18/2007  . GROSS HEMATURIA 11/18/2007  . DIVERTICULOSIS OF COLON 10/26/2007  . 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

## 2014-10-19 ENCOUNTER — Ambulatory Visit: Payer: BLUE CROSS/BLUE SHIELD | Admitting: Physical Therapy

## 2014-10-19 DIAGNOSIS — R262 Difficulty in walking, not elsewhere classified: Secondary | ICD-10-CM

## 2014-10-19 DIAGNOSIS — R269 Unspecified abnormalities of gait and mobility: Secondary | ICD-10-CM

## 2014-10-19 DIAGNOSIS — M25571 Pain in right ankle and joints of right foot: Secondary | ICD-10-CM

## 2014-10-19 NOTE — Therapy (Signed)
[facility] [address]  [place], [place], [address] Phone: [phone]   Fax:  [phone]  Physical Therapy Treatment  Patient Details  Name: Hannah [name] MRN: [id number] Date of Birth: [birth year] No Data Recorded  Encounter Date: 10/19/2014      PT End of Session - 10/19/14 1014    Visit Number 8   Number of Visits 12   Date for PT Re-Evaluation 10/29/14   PT Start Time 1013   PT Stop Time 1110   PT Time Calculation (min) 57 min      Past Medical History  Diagnosis Date  . Hyperlipidemia   . Other chronic cystitis   . Diverticulosis of colon (without mention of hemorrhage)   . Gross hematuria   . Neoplasm of uncertain behavior of bladder   . Other symptoms involving urinary system(788.99)   . IC (interstitial cystitis)     per Dr. Jacqlyn Larsen, s/p botox injection  . Calculus of kidney     Past Surgical History  Procedure Laterality Date  . Abdominal hysterectomy  10/1999  . Cholecystectomy  10/23/2005    Lap  . Physical therapy  12/1997; 01/06/1998; 03/1998  . Laparoscopy abdomen diagnostic  07/1999    exploration adhesions to ovaries  . Ct abd w & pelvis wo cm  10/09    Thickening and irreg dome of bladder.  Diverticulosis  . Cystoscopy/bladder bx  11/03/07    Path...inflammation.  Hydrodistention Excoriations (Dr. Jacqlyn Larsen)   . Lumbar spine surgery    . Bilateral salpingoophorectomy    . Laparoscopic appendectomy  10/2013    [facility]    There were no vitals filed for this visit.  Visit Diagnosis:  Right ankle pain  Difficulty walking  Abnormality of gait      Subjective Assessment - 10/19/14 1016    Subjective States noted significant decrease in pain following last treatment for rest of that day and into next day. States tape went fine after last treatment and did not note any skin issues following this.   Currently in Pain? Yes   Pain Score 4   3-4/10   Pain Location Ankle  R ankle and  foot   Pain Orientation Right           TODAY'S TREATMENT TherEx - Seated BAPS R LVL 4 CW/CCW 6x each; PF/DF 5# at AM, AL, PM, and PL 10x each DF Rocker Stretch 3x20" B Heel Raise at UBE 10x (stopped due to pain and clicking to R lateral midfoot)  Manual - Pt supine grade 2 AP mobes to talocrural (well tolerated) and grade 2 rotation mobes to TMT area (poorly tolerated); then pt prone performed hindfoot distraction (no pain or problem), grade 2 and 3 talocrural PA mobes (well tolerated), and forefoot distraction (poorly tolerated).  [place] - 20%, 2cm, 0.5W/cm2, 10', 3.3MHz to R peroneus longus tendon.  Ionto #5/6 with 29mA/min dex in 4 hour patch to lateral aspect R foot/ankle.  Pt is to remove at 1600 due to pedicure scheduled at that time.  Gave pt 4 strips of kinesiotape and instructed her in foot taping options and showed her [employer] website.                      PT Education - 10/19/14 1132    Education provided Yes   Education Details Ankle taping   Person(s) Educated Patient   Methods Explanation;Demonstration   Comprehension Verbalized understanding  PT Short Term Goals - 10/03/14 1444    PT SHORT TERM GOAL #1   Title pt reports AVG pain decreases to 3/10 and worst pain to no greater than 6/10 by 10/15/14   Status On-going   PT SHORT TERM GOAL #2   Title pt independent with initial HEP by 10/15/14   Status On-going           PT Long Term Goals - 10/03/14 1445    PT LONG TERM GOAL #1   Title pt reports R foot/ankle pain is intermittent in nature rating no greater than 3/10 at worst and AVG 1-2/10 or better by 10/29/14   Status On-going   PT LONG TERM GOAL #2   Title R Ankle MMT 5/5 all planes without c/o pain with testing by 10/29/14   Status On-going   PT LONG TERM GOAL #3   Title pt able to ambulate with good mechanics throughout the day without limitation by pain or need for ankle brace by 10/29/14   Status On-going                Plan - 10/19/14 1123    Clinical Impression Statement pt reports noting good decrease in pain following last treatment and only 3-4/10 so far today.  Pain continues along peroneal tendon and lateral leg but minimal TTP into muscle today so no deep tissue work performed.  Did increase exercises today to include addition of heel raises.  Pt noted repeated clicking sensation in R midfoot during this exercise and this was palpable through her shoe; however, I could not pinpoint location but seemed in area of lateral TMT joints.  Pt also with TTP along lateral 3-4 TMT - question ligament injury to this area?   PT Next Visit Plan continue to treat as able with manual and modalities and progress stability exercises to tolerance   Consulted and Agree with Plan of Care Patient        Problem List Patient Active Problem List   Diagnosis Date Noted  . Atypical chest pain 02/01/2013  . UTI (urinary tract infection) 11/03/2012  . LFT elevation 02/10/2012  . Back pain 06/19/2011  . Migraine with aura 06/19/2011  . Anxiety 02/23/2011  . Fever blister 11/25/2010  . HYPERCHOLESTEROLEMIA 06/14/2008  . NEPHROLITHIASIS 11/23/2007  . Interstitial cystitis 11/18/2007  . GROSS HEMATURIA 11/18/2007  . DIVERTICULOSIS OF COLON 10/26/2007  . 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 [place], [place], [address] Phone: [phone]   Fax:  [phone]  Name: Hannah [name] MRN: [id number] Date of Birth: [birth year]

## 2014-10-22 ENCOUNTER — Ambulatory Visit: Payer: BLUE CROSS/BLUE SHIELD | Admitting: Physical Therapy

## 2014-10-23 ENCOUNTER — Ambulatory Visit: Payer: BLUE CROSS/BLUE SHIELD | Admitting: Physical Therapy

## 2014-10-24 ENCOUNTER — Ambulatory Visit: Payer: BLUE CROSS/BLUE SHIELD | Admitting: Physical Therapy

## 2014-10-29 ENCOUNTER — Ambulatory Visit: Payer: BLUE CROSS/BLUE SHIELD | Admitting: Physical Therapy

## 2014-10-29 DIAGNOSIS — M25571 Pain in right ankle and joints of right foot: Secondary | ICD-10-CM | POA: Diagnosis not present

## 2014-10-29 DIAGNOSIS — R262 Difficulty in walking, not elsewhere classified: Secondary | ICD-10-CM

## 2014-10-29 DIAGNOSIS — R269 Unspecified abnormalities of gait and mobility: Secondary | ICD-10-CM

## 2014-10-29 NOTE — Therapy (Signed)
[facility] [address]  [place], [place], [address] Phone: [phone]   Fax:  [phone]  Physical Therapy Treatment  Patient Details  Name: Hannah [name] MRN: [id number] Date of Birth: [birth year] Referring Provider: Wylene Simmer  Encounter Date: 10/29/2014      PT End of Session - 10/29/14 1619    Visit Number 9   Number of Visits 12   Date for PT Re-Evaluation 10/29/14   PT Start Time 3785   PT Stop Time 8850   PT Time Calculation (min) 43 min      Past Medical History  Diagnosis Date  . Hyperlipidemia   . Other chronic cystitis   . Diverticulosis of colon (without mention of hemorrhage)   . Gross hematuria   . Neoplasm of uncertain behavior of bladder   . Other symptoms involving urinary system(788.99)   . IC (interstitial cystitis)     per Dr. Jacqlyn Larsen, s/p botox injection  . Calculus of kidney     Past Surgical History  Procedure Laterality Date  . Abdominal hysterectomy  10/1999  . Cholecystectomy  10/23/2005    Lap  . Physical therapy  12/1997; 01/06/1998; 03/1998  . Laparoscopy abdomen diagnostic  07/1999    exploration adhesions to ovaries  . Ct abd w & pelvis wo cm  10/09    Thickening and irreg dome of bladder.  Diverticulosis  . Cystoscopy/bladder bx  11/03/07    Path...inflammation.  Hydrodistention Excoriations (Dr. Jacqlyn Larsen)   . Lumbar spine surgery    . Bilateral salpingoophorectomy    . Laparoscopic appendectomy  10/2013    [facility]    There were no vitals filed for this visit.  Visit Diagnosis:  Right ankle pain  Difficulty walking  Abnormality of gait      Subjective Assessment - 10/29/14 1530    Subjective States exp'd intense lower back pain last week and was unable to attend PT treatments and unable to go to work.  States went to [employer] for this and is feeling better.  Regarding foot pain pt states she's feels as though is getting better and states is very excited due to  this.  She rates pain around 6/10 and is located top to bottom of lateral foot.  She states she feels at least 40% improvement lately and states most benefit noted in evenings when standing to cook.   Currently in Pain? Yes   Pain Score 6    Pain Location Ankle   Pain Orientation Right                TODAY'S TREATMENT TherEx - Seated BAPS R LVL 3 CW/CCW 10x each; PF/DF 5# at AM, AL, PM, and PL 10x each DF Rocker Stretch 3x20" B Heel Raise at UBE 15x (no c/o increased pain today) R SLS rotation stab Red TB 8x3" R BOSU (Down) step-up with counter assist 10x  Manual -  pt prone grade 2 and 3 talocrural PA mobes, and forefoot distraction (better tolerance but still painful)  [place] - 20%, 2cm, 0.5W/cm2, 10', 3.3MHz to R Lateral ankle and foot in area of mid foot joints  Ionto #6/6 with 37mA/min dex in 4 hour patch to lateral aspect R foot/ankle.                   PT Short Term Goals - 10/29/14 1758    PT SHORT TERM GOAL #1   Title pt reports AVG pain decreases to  3/10 and worst pain to no greater than 6/10 by 10/15/14   Status On-going   PT SHORT TERM GOAL #2   Title pt independent with initial HEP by 10/15/14   Status On-going           PT Long Term Goals - 10/29/14 1759    PT LONG TERM GOAL #1   Title pt reports R foot/ankle pain is intermittent in nature rating no greater than 3/10 at worst and AVG 1-2/10 or better by 10/29/14   Status On-going   PT LONG TERM GOAL #2   Title R Ankle MMT 5/5 all planes without c/o pain with testing by 10/29/14   Status On-going   PT LONG TERM GOAL #3   Title pt able to ambulate with good mechanics throughout the day without limitation by pain or need for ankle brace by 10/29/14   Status On-going               Plan - 10/29/14 1802    Clinical Impression Statement pt was not seen by PT last week due to LBP. She returns today and states ankle continues to improve little by little.  She states she hasn't taken  pain meds past 2 days and that her pain in the evenings has been managable lately even with standing and cooking.  She states pain is still up to 6/10 but overall is very pleased with progress and states feels as though is at least 40% improved.   PT Next Visit Plan continue to treat as able with manual and modalities and progress stability exercises to tolerance   Consulted and Agree with Plan of Care Patient        Problem List Patient Active Problem List   Diagnosis Date Noted  . Atypical chest pain 02/01/2013  . UTI (urinary tract infection) 11/03/2012  . LFT elevation 02/10/2012  . Back pain 06/19/2011  . Migraine with aura 06/19/2011  . Anxiety 02/23/2011  . Fever blister 11/25/2010  . HYPERCHOLESTEROLEMIA 06/14/2008  . NEPHROLITHIASIS 11/23/2007  . Interstitial cystitis 11/18/2007  . GROSS HEMATURIA 11/18/2007  . DIVERTICULOSIS OF COLON 10/26/2007  . 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  [place], [place], [address] Phone: [phone]   Fax:  [phone]  Name: AMALEE [name] MRN: [id number] Date of Birth: 07/13/1970

## 2014-10-31 ENCOUNTER — Ambulatory Visit: Payer: BLUE CROSS/BLUE SHIELD | Admitting: Physical Therapy

## 2014-10-31 DIAGNOSIS — M25571 Pain in right ankle and joints of right foot: Secondary | ICD-10-CM

## 2014-10-31 DIAGNOSIS — R269 Unspecified abnormalities of gait and mobility: Secondary | ICD-10-CM

## 2014-10-31 DIAGNOSIS — R262 Difficulty in walking, not elsewhere classified: Secondary | ICD-10-CM

## 2014-10-31 NOTE — Therapy (Signed)
[name] [address]  [name], [place], [address] Phone: [phone]   Fax:  [phone]  Physical Therapy Treatment  Patient Details  Name: Hannah [name] MRN: [id number] Date of Birth: Apr 19, 1970 Referring Provider: Wylene Simmer  Encounter Date: 10/31/2014      PT End of Session - 10/31/14 1100    Visit Number 10   Number of Visits 12   Date for PT Re-Evaluation 10/29/14   PT Start Time 1056   PT Stop Time 1154   PT Time Calculation (min) 58 min      Past Medical History  Diagnosis Date  . Hyperlipidemia   . Other chronic cystitis   . Diverticulosis of colon (without mention of hemorrhage)   . Gross hematuria   . Neoplasm of uncertain behavior of bladder   . Other symptoms involving urinary system(788.99)   . IC (interstitial cystitis)     per Dr. Jacqlyn Larsen, s/p botox injection  . Calculus of kidney     Past Surgical History  Procedure Laterality Date  . Abdominal hysterectomy  10/1999  . Cholecystectomy  10/23/2005    Lap  . Physical therapy  12/1997; 01/06/1998; 03/1998  . Laparoscopy abdomen diagnostic  07/1999    exploration adhesions to ovaries  . Ct abd w & pelvis wo cm  10/09    Thickening and irreg dome of bladder.  Diverticulosis  . Cystoscopy/bladder bx  11/03/07    Path...inflammation.  Hydrodistention Excoriations (Dr. Jacqlyn Larsen)   . Lumbar spine surgery    . Bilateral salpingoophorectomy    . Laparoscopic appendectomy  10/2013    [facility]    There were no vitals filed for this visit.  Visit Diagnosis:  Right ankle pain  Difficulty walking  Abnormality of gait      Subjective Assessment - 10/31/14 1057    Subjective Pt states she has noted some increase in pain since last treatment.  She states that she was basically pain-free last treatment but pain has increased to 6/10 today.  Pain noted in typical lateral mid foot area but states has noted some medial foot pain since last  treatment.   Currently in Pain? Yes   Pain Score 6    Pain Location Ankle   Pain Orientation Right   Pain Descriptors / Indicators Aching           TODAY'S TREATMENT  TherEx - Rec bike lvl 2, 3'  Manual - Dry Needling (verbal informed consent given prior to treatment).  Dry needling performed to R medial and lateral lower leg muscle to include peroneal muscles, flexor hallucis longus, flexor digitorum longus, and medial soleus.  Manual e-stim (Premod 10-20Hz ) ran between needles to medial and then lateral muscles for several 20" bouts each. Following Needling performed STM and TPR to R medial foot in area of abductor hallucis.  Very tender in this area and able to produce pain throughout foot with TPR here.  Kinesiology Tape: 4 strips - 75% wrapping medial foot around hindfoot to lateral foot, 50% plantar fascia and up Achilles, 50% posterior tib, and 50% stirrup                          PT Short Term Goals - 10/29/14 1758    PT SHORT TERM GOAL #1   Title pt reports AVG pain decreases to 3/10 and worst pain to no greater than 6/10 by 10/15/14   Status On-going  PT SHORT TERM GOAL #2   Title pt independent with initial HEP by 10/15/14   Status On-going           PT Long Term Goals - 10/29/14 1759    PT LONG TERM GOAL #1   Title pt reports R foot/ankle pain is intermittent in nature rating no greater than 3/10 at worst and AVG 1-2/10 or better by 10/29/14   Status On-going   PT LONG TERM GOAL #2   Title R Ankle MMT 5/5 all planes without c/o pain with testing by 10/29/14   Status On-going   PT LONG TERM GOAL #3   Title pt able to ambulate with good mechanics throughout the day without limitation by pain or need for ankle brace by 10/29/14   Status On-going               Plan - 10/31/14 1200    Clinical Impression Statement initial dry needling treatment performed today.  Pt very apprehensive but treatment very well tolerated and she  states pain decreased following treatment.  Needling to medial and to lateral leg both produced symptoms extending through leg and into foot reproduction of her pain distibution which is certainly a positive finding for dry needling.   PT Next Visit Plan continue to treat as able with manual and modalities and progress stability exercises to tolerance; needling and taping PRN   Consulted and Agree with Plan of Care Patient        Problem List Patient Active Problem List   Diagnosis Date Noted  . Atypical chest pain 02/01/2013  . UTI (urinary tract infection) 11/03/2012  . LFT elevation 02/10/2012  . Back pain 06/19/2011  . Migraine with aura 06/19/2011  . Anxiety 02/23/2011  . Fever blister 11/25/2010  . HYPERCHOLESTEROLEMIA 06/14/2008  . NEPHROLITHIASIS 11/23/2007  . Interstitial cystitis 11/18/2007  . GROSS HEMATURIA 11/18/2007  . DIVERTICULOSIS OF COLON 10/26/2007  . NEOPLASM OF UNCERTAIN BEHAVIOR OF BLADDER 10/18/2007    H[facility] [name] PT, OCS 10/31/2014, 2:19 PM  [facility] [place], [place], [address] Phone: [phone]   Fax:  [phone]  Name: Hannah [name] MRN: [id number] Date of Birth: [birth year]

## 2014-11-01 ENCOUNTER — Ambulatory Visit: Payer: BLUE CROSS/BLUE SHIELD | Admitting: Physical Therapy

## 2014-11-01 DIAGNOSIS — R262 Difficulty in walking, not elsewhere classified: Secondary | ICD-10-CM

## 2014-11-01 DIAGNOSIS — M25571 Pain in right ankle and joints of right foot: Secondary | ICD-10-CM

## 2014-11-01 DIAGNOSIS — R269 Unspecified abnormalities of gait and mobility: Secondary | ICD-10-CM

## 2014-11-01 NOTE — Therapy (Signed)
[facility] [address]  [place], [place], [address] Phone: [phone]   Fax:  [phone]  Physical Therapy Treatment  Patient Details  Name: Hannah [name] MRN: [id number] Date of Birth: May 09, 1970 Referring Provider: Wylene Simmer  Encounter Date: 11/01/2014      PT End of Session - 11/01/14 1105    Visit Number 11   Number of Visits 12   PT Start Time 5631   PT Stop Time 1148   PT Time Calculation (min) 45 min      Past Medical History  Diagnosis Date  . Hyperlipidemia   . Other chronic cystitis   . Diverticulosis of colon (without mention of hemorrhage)   . Gross hematuria   . Neoplasm of uncertain behavior of bladder   . Other symptoms involving urinary system(788.99)   . IC (interstitial cystitis)     per Dr. Jacqlyn Larsen, s/p botox injection  . Calculus of kidney     Past Surgical History  Procedure Laterality Date  . Abdominal hysterectomy  10/1999  . Cholecystectomy  10/23/2005    Lap  . Physical therapy  12/1997; 01/06/1998; 03/1998  . Laparoscopy abdomen diagnostic  07/1999    exploration adhesions to ovaries  . Ct abd w & pelvis wo cm  10/09    Thickening and irreg dome of bladder.  Diverticulosis  . Cystoscopy/bladder bx  11/03/07    Path...inflammation.  Hydrodistention Excoriations (Dr. Jacqlyn Larsen)   . Lumbar spine surgery    . Bilateral salpingoophorectomy    . Laparoscopic appendectomy  10/2013    [facility]    There were no vitals filed for this visit.  Visit Diagnosis:  Right ankle pain  Difficulty walking  Abnormality of gait      Subjective Assessment - 11/01/14 1104    Subjective pt states much better with regard to pain so far today.  States pain has been 3/10 this AM.  Denies noting spasms to medial foot since this was addressed at last treatment.  States has noted some R knee and hip soreness lately which she feels may be related to standing/walking more on R LE.   Currently in  Pain? Yes   Pain Score 3    Pain Location Ankle   Pain Orientation Right            TODAY'S TREATMENT  TherEx - Rec bike lvl 2, 3' DL Heel Raise at UBE 15x (again noted lateral foot clicking but this time had pt perform a few more reps to attempt to locate source and it appears to be subluxing peroneus tendon(s)) DF Rocker Stretch 3x20" SLS on foam pad 3x15" bouts each foot with B pole A.  Had difficulty with B feet requiring pole assist but R foot much worse vs L.  Noted mild increased pain with this.  [place] - 20%, 2cm, 8', 0.5W/cm2, 3.3MHz to distal R peroneal tendons  Manual - STM to R abductor hallucis, forefoot and midfoot light distraction mobes to help decrease pain  Kinesiology Tape: 4 strips - 75% wrapping medial foot around hindfoot to lateral foot, 50% plantar fascia and up Achilles, 50% posterior tib, and 50% stirrup                     PT Education - 11/01/14 1202    Education provided Yes   Education Details SLS training   Person(s) Educated Patient   Methods Explanation;Demonstration   Comprehension Verbalized understanding;Returned demonstration  PT Short Term Goals - 10/29/14 1758    PT SHORT TERM GOAL #1   Title pt reports AVG pain decreases to 3/10 and worst pain to no greater than 6/10 by 10/15/14   Status On-going   PT SHORT TERM GOAL #2   Title pt independent with initial HEP by 10/15/14   Status On-going           PT Long Term Goals - 10/29/14 1759    PT LONG TERM GOAL #1   Title pt reports R foot/ankle pain is intermittent in nature rating no greater than 3/10 at worst and AVG 1-2/10 or better by 10/29/14   Status On-going   PT LONG TERM GOAL #2   Title R Ankle MMT 5/5 all planes without c/o pain with testing by 10/29/14   Status On-going   PT LONG TERM GOAL #3   Title pt able to ambulate with good mechanics throughout the day without limitation by pain or need for ankle brace by 10/29/14   Status On-going                Plan - 11/01/14 1203    Clinical Impression Statement pt with less pain since last treatment which included dry needling.  Today performed B Heel Raises again and noted clicking again.  Had pt perform a few reps despite clicking and mild increase pain to locate source of click and it seems subluxing peroneal tendon(s) to posterior lateral malleolus.  Pt is reporting benefit with treatment to date so we will continue with stability progressions as able.   PT Next Visit Plan continue to treat as able with manual and modalities and progress stability exercises to tolerance; needling and taping PRN   Consulted and Agree with Plan of Care Patient        Problem List Patient Active Problem List   Diagnosis Date Noted  . Atypical chest pain 02/01/2013  . UTI (urinary tract infection) 11/03/2012  . LFT elevation 02/10/2012  . Back pain 06/19/2011  . Migraine with aura 06/19/2011  . Anxiety 02/23/2011  . Fever blister 11/25/2010  . HYPERCHOLESTEROLEMIA 06/14/2008  . NEPHROLITHIASIS 11/23/2007  . Interstitial cystitis 11/18/2007  . GROSS HEMATURIA 11/18/2007  . DIVERTICULOSIS OF COLON 10/26/2007  . 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  [place], [place], [address] Phone: [phone]   Fax:  [phone]  Name: Hannah [name] MRN: [id number] Date of Birth: 1970/01/11

## 2014-11-06 ENCOUNTER — Ambulatory Visit: Payer: BLUE CROSS/BLUE SHIELD | Attending: Orthopedic Surgery | Admitting: Physical Therapy

## 2014-11-06 DIAGNOSIS — M25571 Pain in right ankle and joints of right foot: Secondary | ICD-10-CM | POA: Diagnosis not present

## 2014-11-06 DIAGNOSIS — R262 Difficulty in walking, not elsewhere classified: Secondary | ICD-10-CM | POA: Insufficient documentation

## 2014-11-06 DIAGNOSIS — R269 Unspecified abnormalities of gait and mobility: Secondary | ICD-10-CM | POA: Diagnosis present

## 2014-11-06 NOTE — Therapy (Signed)
[facility] [address]  [place], [place], [address] Phone: [phone]   Fax:  [phone]  Physical Therapy Treatment  Patient Details  Name: Hannah [name] MRN: [id number] Date of Birth: 07/03/1970 Referring Provider: Wylene Simmer  Encounter Date: 11/06/2014      PT End of Session - 11/06/14 0720    Visit Number 12   Number of Visits 20   Date for PT Re-Evaluation 12/04/14   PT Start Time 0719   PT Stop Time 0805   PT Time Calculation (min) 46 min      Past Medical History  Diagnosis Date  . Hyperlipidemia   . Other chronic cystitis   . Diverticulosis of colon (without mention of hemorrhage)   . Gross hematuria   . Neoplasm of uncertain behavior of bladder   . Other symptoms involving urinary system(788.99)   . IC (interstitial cystitis)     per Dr. Jacqlyn Larsen, s/p botox injection  . Calculus of kidney     Past Surgical History  Procedure Laterality Date  . Abdominal hysterectomy  10/1999  . Cholecystectomy  10/23/2005    Lap  . Physical therapy  12/1997; 01/06/1998; 03/1998  . Laparoscopy abdomen diagnostic  07/1999    exploration adhesions to ovaries  . Ct abd w & pelvis wo cm  10/09    Thickening and irreg dome of bladder.  Diverticulosis  . Cystoscopy/bladder bx  11/03/07    Path...inflammation.  Hydrodistention Excoriations (Dr. Jacqlyn Larsen)   . Lumbar spine surgery    . Bilateral salpingoophorectomy    . Laparoscopic appendectomy  10/2013    [facility]    There were no vitals filed for this visit.  Visit Diagnosis:  Right ankle pain - Plan: PT plan of care cert/re-cert  Difficulty walking - Plan: PT plan of care cert/re-cert  Abnormality of gait - Plan: PT plan of care cert/re-cert      Subjective Assessment - 11/06/14 0719    Subjective Pt states has been busy over the weekend and noting more pain yesterday and today.  States overall feels as though is improving and states feels as though is 50%  improved. States has been working on [employer] activities and notes good improvement with this on the floor with shoes on.  States does not feel up to exercises today due to increased soreness past 2 days.   Currently in Pain? Yes   Pain Score 6    Pain Location Ankle   Pain Orientation Right   Pain Radiating Towards Chief area of pain is throughout 4th MT and lateral talocrural.  She also c/o intermittent spasms to medial foot (abductor hallucis and lateral lower leg).   Pain Frequency Intermittent            [facility] PT Assessment - 11/06/14 0001    ROM / Strength   AROM / PROM / Strength Strength   Strength   Overall Strength Comments notes increased pain throughout mid and hindfoot following MMT   Strength Assessment Site Ankle   Right/Left Ankle Right   Right Ankle Dorsiflexion 5/5   Right Ankle Inversion 4/5  medial   Right Ankle Eversion 4/5  lateral ankle pain   Palpation   Palpation comment TTP R post tib tendon and abductor hallucis; also TTP but less intense to lateral talocrural and peroneal tendons             TODAY'S TREATMENT Ankle MMT and assessment for PR  [place] -  20%, 2cm, 8', 0.5W/cm2, 3.3MHz to distal R post tib tendon  Manual - STM and TPR to R abductor hallucis and along post tib muscle and tendon.  Quite tender in this area today but pt notes improvement following manual.              PT Short Term Goals - 11/06/14 1148    PT SHORT TERM GOAL #1   Title pt reports AVG pain decreases to 3/10 and worst pain to no greater than 6/10 by 10/15/14   Status On-going   PT SHORT TERM GOAL #2   Title pt independent with initial HEP by 10/15/14   Status Achieved           PT Long Term Goals - 11/06/14 0721    PT LONG TERM GOAL #1   Title pt reports R foot/ankle pain is intermittent in nature rating no greater than 3/10 at worst and AVG 1-2/10 or better by 12/04/14  reported pain values 3-6/10 lately.  States pain is intermittent.   Status On-going    PT LONG TERM GOAL #2   Title R Ankle MMT 5/5 all planes without c/o pain with testing by 12/04/14  continued pain with EV and IV testing; PF not assessed   Status On-going   PT LONG TERM GOAL #3   Title pt able to ambulate with good mechanics throughout the day without limitation by pain or need for ankle brace by 12/04/14  states no longer wears brace and is typically not limited by pain; however, does note limited tolerance with prolonged standing activities   Status Partially Met               Plan - 11/06/14 1151    Clinical Impression Statement Hannah [name] reports noting improvement in R foot/ankle pain since beginning PT stating she feels as though has progressed 50% towards normal function.  Her complaints of pain varry in location but most signficant pain and tenderness is noted to R medial foot along abductor hallucis and posterior tib tendon and to lateral foot at anterior aspect of talocrural joint and along 4th MT.  She states pain has progressed to intermittent in nature and she states there are times she feels 75% back to normal.  However, she notes increased pain with prolonged standing/walking activities and she was busy this weekend and c/o increased pain today due to this.  Treatments to date have included: 6 ionto patches with 80 mA.min of Dex to R foot/ankle, 20% [place] and frequent Soft Tissue work throughout foot and lower leg, 1 session of dry needling, along with ankle strengthening and stablility exercises to tolerance.  Given that she is making rather good progress with PT to date but still with significant pain and limited level of function due to pain, I am recommending she continue with PT for 4 wks at 2x/wk.   Pt will benefit from skilled therapeutic intervention in order to improve on the following deficits Pain;Decreased strength;Increased edema;Improper body mechanics;Decreased balance;Abnormal gait;Difficulty walking   Rehab Potential Good   PT Frequency 2x / week    PT Duration 4 weeks   PT Treatment/Interventions Therapeutic exercise;Manual techniques;Taping;Therapeutic activities;Iontophoresis 39m/ml Dexamethasone;Vasopneumatic Device;Dry needling;Balance training;Patient/family education;Functional mobility training;Gait training;Electrical Stimulation;Cryotherapy;Ultrasound   PT Next Visit Plan continue to treat as able with manual and modalities and progress stability exercises to tolerance; needling and taping PRN   Consulted and Agree with Plan of Care Patient        Problem List Patient Active  Problem List   Diagnosis Date Noted  . Atypical chest pain 02/01/2013  . UTI (urinary tract infection) 11/03/2012  . LFT elevation 02/10/2012  . Back pain 06/19/2011  . Migraine with aura 06/19/2011  . Anxiety 02/23/2011  . Fever blister 11/25/2010  . HYPERCHOLESTEROLEMIA 06/14/2008  . NEPHROLITHIASIS 11/23/2007  . Interstitial cystitis 11/18/2007  . GROSS HEMATURIA 11/18/2007  . DIVERTICULOSIS OF COLON 10/26/2007  . 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

## 2014-11-08 ENCOUNTER — Encounter: Payer: BLUE CROSS/BLUE SHIELD | Admitting: Physical Therapy

## 2014-11-09 ENCOUNTER — Ambulatory Visit: Payer: BLUE CROSS/BLUE SHIELD | Admitting: Physical Therapy

## 2014-11-09 ENCOUNTER — Encounter: Payer: BLUE CROSS/BLUE SHIELD | Admitting: Physical Therapy

## 2014-11-09 DIAGNOSIS — R262 Difficulty in walking, not elsewhere classified: Secondary | ICD-10-CM

## 2014-11-09 DIAGNOSIS — R269 Unspecified abnormalities of gait and mobility: Secondary | ICD-10-CM

## 2014-11-09 DIAGNOSIS — M25571 Pain in right ankle and joints of right foot: Secondary | ICD-10-CM | POA: Diagnosis not present

## 2014-11-09 NOTE — Therapy (Signed)
[facility] [address]  [place], [place], [address] Phone: [phone]   Fax:  [phone]  Physical Therapy Treatment  Patient Details  Name: Hannah [name] MRN: [id number] Date of Birth: [birth year] Referring Provider: Wylene Simmer  Encounter Date: 11/09/2014      PT End of Session - 11/09/14 0846    Visit Number 13   Number of Visits 20   Date for PT Re-Evaluation 12/04/14   PT Start Time 0800   PT Stop Time 0845   PT Time Calculation (min) 45 min   Activity Tolerance Patient tolerated treatment well   Behavior During Therapy [facility] for tasks assessed/performed      Past Medical History  Diagnosis Date   Hyperlipidemia    Other chronic cystitis    Diverticulosis of colon (without mention of hemorrhage)    Gross hematuria    Neoplasm of uncertain behavior of bladder    Other symptoms involving urinary system(788.99)    IC (interstitial cystitis)     per Dr. Jacqlyn Larsen, s/p botox injection   Calculus of kidney     Past Surgical History  Procedure Laterality Date   Abdominal hysterectomy  10/1999   Cholecystectomy  10/23/2005    Lap   Physical therapy  12/1997; 01/06/1998; 03/1998   Laparoscopy abdomen diagnostic  07/1999    exploration adhesions to ovaries   Ct abd w & pelvis wo cm  10/09    Thickening and irreg dome of bladder.  Diverticulosis   Cystoscopy/bladder bx  11/03/07    Path...inflammation.  Hydrodistention Excoriations (Dr. Jacqlyn Larsen)    Lumbar spine surgery     Bilateral salpingoophorectomy     Laparoscopic appendectomy  10/2013    [facility]    There were no vitals filed for this visit.  Visit Diagnosis:  Right ankle pain  Difficulty walking  Abnormality of gait      Subjective Assessment - 11/09/14 0845    Currently in Pain? Yes   Pain Score 3    Pain Location Ankle   Pain Orientation Right   Pain Descriptors / Indicators Aching   Pain Radiating Towards Primary pain  complaint over 4th-5th MT, lateral ankle, and peroneals        Today's Treatment; Nu step L4 x6' Prostretch gastroc and soleus stretch 3x30"ea Right single leg stance on Blue foam pad with B poles for UE assist 3x15" Fwd step down 2x10  20%us 3.90mz @ 1.2w/ cm2 Right 4th-5th MT, lateral ankle x8'  Kinesiotape 4 strips 75% med malleouls-hind foot- lateal malleolus, plantar fascia- calcaneus-gastroc, stirrup, post tib  Soft tissue Mobilization; Lateral foot, ankle and peroneals                            PT Short Term Goals - 11/06/14 1148    PT SHORT TERM GOAL #1   Title pt reports AVG pain decreases to 3/10 and worst pain to no greater than 6/10 by 10/15/14   Status On-going   PT SHORT TERM GOAL #2   Title pt independent with initial HEP by 10/15/14   Status Achieved           PT Long Term Goals - 11/06/14 0721    PT LONG TERM GOAL #1   Title pt reports R foot/ankle pain is intermittent in nature rating no greater than 3/10 at worst and AVG 1-2/10 or better by 12/04/14  reported pain values 3-6/10  lately.  States pain is intermittent.   Status On-going   PT LONG TERM GOAL #2   Title R Ankle MMT 5/5 all planes without c/o pain with testing by 12/04/14  continued pain with EV and IV testing; PF not assessed   Status On-going   PT LONG TERM GOAL #3   Title pt able to ambulate with good mechanics throughout the day without limitation by pain or need for ankle brace by 12/04/14  states no longer wears brace and is typically not limited by pain; however, does note limited tolerance with prolonged standing activities   Status Partially Met               Plan - 11/09/14 0846    Clinical Impression Statement Tolerates signle leg balance on foam pad with much less difficulty and pain.  Did have increased pain with forward step down.  Tender to palpation over 4th-5th MT.   Pt will benefit from skilled therapeutic intervention in order to improve on  the following deficits Pain;Decreased strength;Increased edema;Improper body mechanics;Decreased balance;Abnormal gait;Difficulty walking   PT Next Visit Plan continue to treat as able with manual and modalities and progress stability exercises to tolerance; needling and taping PRN        Problem List Patient Active Problem List   Diagnosis Date Noted   Atypical chest pain 02/01/2013   UTI (urinary tract infection) 11/03/2012   LFT elevation 02/10/2012   Back pain 06/19/2011   Migraine with aura 06/19/2011   Anxiety 02/23/2011   Fever blister 11/25/2010   HYPERCHOLESTEROLEMIA 06/14/2008   NEPHROLITHIASIS 11/23/2007   Interstitial cystitis 11/18/2007   GROSS HEMATURIA 11/18/2007   DIVERTICULOSIS OF COLON 10/26/2007   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  [place], [place], [address] Phone: [phone]   Fax:  [phone]  Name: Hannah [name] MRN: [id number] Date of Birth: Sep 14, 1970

## 2014-11-13 ENCOUNTER — Ambulatory Visit: Payer: BLUE CROSS/BLUE SHIELD | Admitting: Physical Therapy

## 2014-11-13 DIAGNOSIS — R262 Difficulty in walking, not elsewhere classified: Secondary | ICD-10-CM

## 2014-11-13 DIAGNOSIS — M25571 Pain in right ankle and joints of right foot: Secondary | ICD-10-CM | POA: Diagnosis not present

## 2014-11-13 DIAGNOSIS — R269 Unspecified abnormalities of gait and mobility: Secondary | ICD-10-CM

## 2014-11-13 NOTE — Therapy (Signed)
[facility] [address]  [place], [place], [address] Phone: [phone]   Fax:  [phone]  Physical Therapy Treatment  Patient Details  Name: Hannah [name] MRN: [id number] Date of Birth: 23-Jun-1970 Referring Provider: Wylene Simmer  Encounter Date: 11/13/2014      PT End of Session - 11/13/14 1321    Visit Number 14   Number of Visits 20   Date for PT Re-Evaluation 12/04/14   PT Start Time 1320   PT Stop Time 1415   PT Time Calculation (min) 55 min      Past Medical History  Diagnosis Date  . Hyperlipidemia   . Other chronic cystitis   . Diverticulosis of colon (without mention of hemorrhage)   . Gross hematuria   . Neoplasm of uncertain behavior of bladder   . Other symptoms involving urinary system(788.99)   . IC (interstitial cystitis)     per Dr. Jacqlyn Larsen, s/p botox injection  . Calculus of kidney     Past Surgical History  Procedure Laterality Date  . Abdominal hysterectomy  10/1999  . Cholecystectomy  10/23/2005    Lap  . Physical therapy  12/1997; 01/06/1998; 03/1998  . Laparoscopy abdomen diagnostic  07/1999    exploration adhesions to ovaries  . Ct abd w & pelvis wo cm  10/09    Thickening and irreg dome of bladder.  Diverticulosis  . Cystoscopy/bladder bx  11/03/07    Path...inflammation.  Hydrodistention Excoriations (Dr. Jacqlyn Larsen)   . Lumbar spine surgery    . Bilateral salpingoophorectomy    . Laparoscopic appendectomy  10/2013    [facility]    There were no vitals filed for this visit.  Visit Diagnosis:  Right ankle pain  Difficulty walking  Abnormality of gait      Subjective Assessment - 11/13/14 1321    Subjective Reporting feeling near 60% back to normal and feels as though can continue to progress with PT. Currently taking pain meds 1-2x/wk and has not been wearing ankle brace lately.  Pt reports pain frequency has improved from constant to 50-60% of the time.   Currently in Pain?  Yes   Pain Score 5   states notes pain in R LE approx 50-60% ot the day and rates AVG pain 6/10 whenn present and up to 7/10 at worst   Pain Location Ankle   Pain Orientation Right   Pain Frequency Intermittent         TODAY'S TREATMENT  TherEx - TRX Mini Squat with Heel Raise 10x TRX BOSU (Down) Mini Squat 10x then Narrow Standing Mini Squat 10x TRX Lateral Step-up BOSU (Up) 10x 6" BW and Lateral step-downs to R forefoot 10x each with Single Pole A  Manual - STM to R abductor hallucis  [place] - 20%, 2cm, 8', 0.5W/cm2, 1.0 MHz to distal R peroneal tendons  Kinesiology Tape: 4 strips - 75% wrapping medial foot around hindfoot to lateral foot, 50% up Achilles, 50% posterior tib, and 50% stirrup             PT Short Term Goals - 11/06/14 1148    PT SHORT TERM GOAL #1   Title pt reports AVG pain decreases to 3/10 and worst pain to no greater than 6/10 by 10/15/14   Status On-going   PT SHORT TERM GOAL #2   Title pt independent with initial HEP by 10/15/14   Status Achieved  PT Long Term Goals - 11/06/14 0721    PT LONG TERM GOAL #1   Title pt reports R foot/ankle pain is intermittent in nature rating no greater than 3/10 at worst and AVG 1-2/10 or better by 12/04/14  reported pain values 3-6/10 lately.  States pain is intermittent.   Status On-going   PT LONG TERM GOAL #2   Title R Ankle MMT 5/5 all planes without c/o pain with testing by 12/04/14  continued pain with EV and IV testing; PF not assessed   Status On-going   PT LONG TERM GOAL #3   Title pt able to ambulate with good mechanics throughout the day without limitation by pain or need for ankle brace by 12/04/14  states no longer wears brace and is typically not limited by pain; however, does note limited tolerance with prolonged standing activities   Status Partially Met               Plan - 11/13/14 1438    Clinical Impression Statement progressed standing exercises today and seemed  to be well tolerated.  She reports feeling nearly 60% improvement since beginning PT and is pleased with progress overall.  She states at her current rate of improvement she'd like to continue with PT rather than pursue MRI or even consider surgery if that were recommended.   PT Next Visit Plan continue to treat as able with manual and modalities and progress stability exercises to tolerance; needling and taping PRN   Consulted and Agree with Plan of Care Patient        Problem List Patient Active Problem List   Diagnosis Date Noted  . Atypical chest pain 02/01/2013  . UTI (urinary tract infection) 11/03/2012  . LFT elevation 02/10/2012  . Back pain 06/19/2011  . Migraine with aura 06/19/2011  . Anxiety 02/23/2011  . Fever blister 11/25/2010  . HYPERCHOLESTEROLEMIA 06/14/2008  . NEPHROLITHIASIS 11/23/2007  . Interstitial cystitis 11/18/2007  . GROSS HEMATURIA 11/18/2007  . DIVERTICULOSIS OF COLON 10/26/2007  . 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

## 2014-11-15 ENCOUNTER — Ambulatory Visit: Payer: BLUE CROSS/BLUE SHIELD | Admitting: Physical Therapy

## 2014-11-15 DIAGNOSIS — R262 Difficulty in walking, not elsewhere classified: Secondary | ICD-10-CM

## 2014-11-15 DIAGNOSIS — M25571 Pain in right ankle and joints of right foot: Secondary | ICD-10-CM

## 2014-11-15 DIAGNOSIS — R269 Unspecified abnormalities of gait and mobility: Secondary | ICD-10-CM

## 2014-11-15 NOTE — Therapy (Signed)
[facility] [address]  [place], [place], [address] Phone: [phone]   Fax:  [phone]  Physical Therapy Treatment  Patient Details  Name: Hannah [name] MRN: [id number] Date of Birth: Jul 12, 1970 Referring Provider: Wylene Simmer  Encounter Date: 11/15/2014      PT End of Session - 11/15/14 1535    Visit Number 15   Number of Visits 20   Date for PT Re-Evaluation 12/04/14   PT Start Time 8088   PT Stop Time 1622   PT Time Calculation (min) 48 min      Past Medical History  Diagnosis Date  . Hyperlipidemia   . Other chronic cystitis   . Diverticulosis of colon (without mention of hemorrhage)   . Gross hematuria   . Neoplasm of uncertain behavior of bladder   . Other symptoms involving urinary system(788.99)   . IC (interstitial cystitis)     per Dr. Jacqlyn Larsen, s/p botox injection  . Calculus of kidney     Past Surgical History  Procedure Laterality Date  . Abdominal hysterectomy  10/1999  . Cholecystectomy  10/23/2005    Lap  . Physical therapy  12/1997; 01/06/1998; 03/1998  . Laparoscopy abdomen diagnostic  07/1999    exploration adhesions to ovaries  . Ct abd w & pelvis wo cm  10/09    Thickening and irreg dome of bladder.  Diverticulosis  . Cystoscopy/bladder bx  11/03/07    Path...inflammation.  Hydrodistention Excoriations (Dr. Jacqlyn Larsen)   . Lumbar spine surgery    . Bilateral salpingoophorectomy    . Laparoscopic appendectomy  10/2013    [facility]    There were no vitals filed for this visit.  Visit Diagnosis:  Right ankle pain  Difficulty walking  Abnormality of gait      Subjective Assessment - 11/15/14 1635    Subjective pt reports noting increased pain beginning after last treatment which reached 7/10 yesterday.  Pain mostly located in area of #3-4 MT bases.  States pain has improved since yesterday and is 4/10 today.   Currently in Pain? Yes   Pain Score 4    Pain Location Ankle   Pain Orientation Right         TODAY'S TREATMENT R Ankle palpation and mobility assessment - noted increased pain to 5/10 following this.  Manual - STM throughout plantar and medial aspect of R foot; STM to R foot extensor group extending into anterior lower leg.  Pt reports noting good reduction in pain following this.  [place] - 20%, 2cm, 8', 0.5W/cm2, 1.0 MHz to anteriolateral R ankle and dorsum of foot  Kinesiology Tape: 4 strips - 75% wrapping medial foot around hindfoot to lateral foot, 50% up Achilles, 50% posterior tib, and 50% stirrup            PT Short Term Goals - 11/06/14 1148    PT SHORT TERM GOAL #1   Title pt reports AVG pain decreases to 3/10 and worst pain to no greater than 6/10 by 10/15/14   Status On-going   PT SHORT TERM GOAL #2   Title pt independent with initial HEP by 10/15/14   Status Achieved           PT Long Term Goals - 11/06/14 0721    PT LONG TERM GOAL #1   Title pt reports R foot/ankle pain is intermittent in nature rating no greater than 3/10 at worst and AVG 1-2/10 or better by 12/04/14  reported pain values 3-6/10 lately.  States pain is intermittent.   Status On-going   PT LONG TERM GOAL #2   Title R Ankle MMT 5/5 all planes without c/o pain with testing by 12/04/14  continued pain with EV and IV testing; PF not assessed   Status On-going   PT LONG TERM GOAL #3   Title pt able to ambulate with good mechanics throughout the day without limitation by pain or need for ankle brace by 12/04/14  states no longer wears brace and is typically not limited by pain; however, does note limited tolerance with prolonged standing activities   Status Partially Met               Plan - 11/15/14 1703    Clinical Impression Statement pt noted increased pain shortly after last treatment which progressed over the next day to 7/10.  Pain improved with rest, elevation, and NSAIDs to 4/10 today.  Tenderness noted throughout mid foot especially area  of #3-4 MT base.  Some lateral ankle clicking noted while pt performing ankle AROM motions at start of treatment today. In addition, she states she noted more clicking yesterday during ambulation.  Today's treatment limited to manual and other passive modalities due to tissue irritability today.   PT Next Visit Plan return to exercise as able but be sure to apply CP vs Vaso following exercise in the future to see if this can limit pain increase; manual and modalities including needling PRN   Consulted and Agree with Plan of Care Patient        Problem List Patient Active Problem List   Diagnosis Date Noted  . Atypical chest pain 02/01/2013  . UTI (urinary tract infection) 11/03/2012  . LFT elevation 02/10/2012  . Back pain 06/19/2011  . Migraine with aura 06/19/2011  . Anxiety 02/23/2011  . Fever blister 11/25/2010  . HYPERCHOLESTEROLEMIA 06/14/2008  . NEPHROLITHIASIS 11/23/2007  . Interstitial cystitis 11/18/2007  . GROSS HEMATURIA 11/18/2007  . DIVERTICULOSIS OF COLON 10/26/2007  . 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  [place], [place], [address] Phone: [phone]   Fax:  [phone]  Name: Hannah [name] MRN: [id number] Date of Birth: [birth year]

## 2014-11-16 ENCOUNTER — Ambulatory Visit: Payer: BLUE CROSS/BLUE SHIELD | Admitting: Physical Therapy

## 2014-11-19 ENCOUNTER — Ambulatory Visit: Payer: BLUE CROSS/BLUE SHIELD | Admitting: Physical Therapy

## 2014-11-20 ENCOUNTER — Ambulatory Visit: Payer: BLUE CROSS/BLUE SHIELD | Admitting: Physical Therapy

## 2014-11-22 ENCOUNTER — Ambulatory Visit: Payer: BLUE CROSS/BLUE SHIELD | Admitting: Physical Therapy

## 2014-11-22 DIAGNOSIS — R262 Difficulty in walking, not elsewhere classified: Secondary | ICD-10-CM

## 2014-11-22 DIAGNOSIS — M25571 Pain in right ankle and joints of right foot: Secondary | ICD-10-CM

## 2014-11-22 DIAGNOSIS — R269 Unspecified abnormalities of gait and mobility: Secondary | ICD-10-CM

## 2014-11-22 NOTE — Therapy (Signed)
[facility] [address]  [place], [place], [address] Phone: [phone]   Fax:  [phone]  Physical Therapy Treatment  Patient Details  Name: Hannah [name] MRN: [id number] Date of Birth: April 30, 1970 Referring Provider: Wylene Simmer  Encounter Date: 11/22/2014      PT End of Session - 11/22/14 1536    Visit Number 16   Number of Visits 20   Date for PT Re-Evaluation 12/04/14   PT Start Time 7989   PT Stop Time 1615   PT Time Calculation (min) 42 min      Past Medical History  Diagnosis Date  . Hyperlipidemia   . Other chronic cystitis   . Diverticulosis of colon (without mention of hemorrhage)   . Gross hematuria   . Neoplasm of uncertain behavior of bladder   . Other symptoms involving urinary system(788.99)   . IC (interstitial cystitis)     per Dr. Jacqlyn Larsen, s/p botox injection  . Calculus of kidney     Past Surgical History  Procedure Laterality Date  . Abdominal hysterectomy  10/1999  . Cholecystectomy  10/23/2005    Lap  . Physical therapy  12/1997; 01/06/1998; 03/1998  . Laparoscopy abdomen diagnostic  07/1999    exploration adhesions to ovaries  . Ct abd w & pelvis wo cm  10/09    Thickening and irreg dome of bladder.  Diverticulosis  . Cystoscopy/bladder bx  11/03/07    Path...inflammation.  Hydrodistention Excoriations (Dr. Jacqlyn Larsen)   . Lumbar spine surgery    . Bilateral salpingoophorectomy    . Laparoscopic appendectomy  10/2013    [facility]    There were no vitals filed for this visit.  Visit Diagnosis:  Right ankle pain  Difficulty walking  Abnormality of gait      Subjective Assessment - 11/22/14 1532    Subjective pt states she was with increased pain ever since last treatment up until last couple days.  States hasn't done any balance exercises or HEP other than AROM due to fear of pain.  States has been taking Advil for pain but denies use of ice.   Currently in Pain? Yes   Pain  Score 5    Pain Location Ankle   Pain Orientation Right            TODAY'S TREATMENT Combo [place] (100%, 2.2w/cm2, 1.0MHz, 5cm) with Premod e-stim (10-'20Hz' ) x 12' to R lateral calf mms due to high tone and tenderness to this area [place] - 20%, 2cm, 0.5W/cm2, 8', 3.3MHz to R lateral dorsum of foot Kinesiology Taping - 50% up Achilles, 50% posterior tib, and 50% stirrup            PT Short Term Goals - 11/06/14 1148    PT SHORT TERM GOAL #1   Title pt reports AVG pain decreases to 3/10 and worst pain to no greater than 6/10 by 10/15/14   Status On-going   PT SHORT TERM GOAL #2   Title pt independent with initial HEP by 10/15/14   Status Achieved           PT Long Term Goals - 11/06/14 0721    PT LONG TERM GOAL #1   Title pt reports R foot/ankle pain is intermittent in nature rating no greater than 3/10 at worst and AVG 1-2/10 or better by 12/04/14  reported pain values 3-6/10 lately.  States pain is intermittent.   Status On-going   PT LONG TERM GOAL #2  Title R Ankle MMT 5/5 all planes without c/o pain with testing by 12/04/14  continued pain with EV and IV testing; PF not assessed   Status On-going   PT LONG TERM GOAL #3   Title pt able to ambulate with good mechanics throughout the day without limitation by pain or need for ankle brace by 12/04/14  states no longer wears brace and is typically not limited by pain; however, does note limited tolerance with prolonged standing activities   Status Partially Met               Plan - 11/22/14 1637    Clinical Impression Statement pt with quite a bit of pain over the past week and states is fearful to perform HEP or exercises in clinic due holidays right around the corner.  Today performed combo [place] at 100% along with PreMod e-stim to R calf mms due to pain/tenderness here.  Also performed 20% [place] and taping as in past treatments.   PT Next Visit Plan return to exercise as able but be sure to apply CP vs Vaso following  exercise in the future to see if this can limit pain increase; manual and modalities including needling PRN   Consulted and Agree with Plan of Care Patient        Problem List Patient Active Problem List   Diagnosis Date Noted  . Atypical chest pain 02/01/2013  . UTI (urinary tract infection) 11/03/2012  . LFT elevation 02/10/2012  . Back pain 06/19/2011  . Migraine with aura 06/19/2011  . Anxiety 02/23/2011  . Fever blister 11/25/2010  . HYPERCHOLESTEROLEMIA 06/14/2008  . NEPHROLITHIASIS 11/23/2007  . Interstitial cystitis 11/18/2007  . GROSS HEMATURIA 11/18/2007  . DIVERTICULOSIS OF COLON 10/26/2007  . 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

## 2014-11-27 ENCOUNTER — Ambulatory Visit: Payer: BLUE CROSS/BLUE SHIELD | Admitting: Physical Therapy

## 2014-11-27 DIAGNOSIS — R269 Unspecified abnormalities of gait and mobility: Secondary | ICD-10-CM

## 2014-11-27 DIAGNOSIS — R262 Difficulty in walking, not elsewhere classified: Secondary | ICD-10-CM

## 2014-11-27 DIAGNOSIS — M25571 Pain in right ankle and joints of right foot: Secondary | ICD-10-CM

## 2014-11-27 NOTE — Therapy (Signed)
[facility] [address]  [place], [place], [address] Phone: [phone]   Fax:  [phone]  Physical Therapy Treatment  Patient Details  Name: MACON LESESNE MRN: [id number] Date of Birth: [birth year] Referring Provider: Wylene Simmer  Encounter Date: 11/27/2014    Past Medical History  Diagnosis Date  . Hyperlipidemia   . Other chronic cystitis   . Diverticulosis of colon (without mention of hemorrhage)   . Gross hematuria   . Neoplasm of uncertain behavior of bladder   . Other symptoms involving urinary system(788.99)   . IC (interstitial cystitis)     per Dr. Jacqlyn Larsen, s/p botox injection  . Calculus of kidney     Past Surgical History  Procedure Laterality Date  . Abdominal hysterectomy  10/1999  . Cholecystectomy  10/23/2005    Lap  . Physical therapy  12/1997; 01/06/1998; 03/1998  . Laparoscopy abdomen diagnostic  07/1999    exploration adhesions to ovaries  . Ct abd w & pelvis wo cm  10/09    Thickening and irreg dome of bladder.  Diverticulosis  . Cystoscopy/bladder bx  11/03/07    Path...inflammation.  Hydrodistention Excoriations (Dr. Jacqlyn Larsen)   . Lumbar spine surgery    . Bilateral salpingoophorectomy    . Laparoscopic appendectomy  10/2013    [facility]    There were no vitals filed for this visit.  Visit Diagnosis:  No diagnosis found.      Subjective Assessment - 11/27/14 1316    Subjective states foot/ankle pain has decreased significantly over the past several days stating is 3-4/10 lately stating it feels weak and tired but doesn't really hurt. Most pain still located #3-4 MT.   Currently in Pain? Yes   Pain Score 4   3-4/10   Pain Location Ankle   Pain Orientation Right   Pain Descriptors / Indicators Aching             TODAY'S TREATMENT TherEx - Standing Hip ABD with Yellow TB at B forefoot 15x each Standing R Hip Flexion and Adduction with RedTB at forefoot 15x each then same  performed with band on L for R stability 15x each  Combo: [place] - 20%, 2cm, 0.5W/cm2, 3.3MHz with Premod e-stim (10-'20Hz' ) to lateral aspect of R lateral midfoot/ankle.  Kinesiotape: 3 strips - 50-75% each: Achilles, stirrup, posterior tib/medial arch                PT Short Term Goals - 11/06/14 1148    PT SHORT TERM GOAL #1   Title pt reports AVG pain decreases to 3/10 and worst pain to no greater than 6/10 by 10/15/14   Status On-going   PT SHORT TERM GOAL #2   Title pt independent with initial HEP by 10/15/14   Status Achieved           PT Long Term Goals - 11/06/14 0721    PT LONG TERM GOAL #1   Title pt reports R foot/ankle pain is intermittent in nature rating no greater than 3/10 at worst and AVG 1-2/10 or better by 12/04/14  reported pain values 3-6/10 lately.  States pain is intermittent.   Status On-going   PT LONG TERM GOAL #2   Title R Ankle MMT 5/5 all planes without c/o pain with testing by 12/04/14  continued pain with EV and IV testing; PF not assessed   Status On-going   PT LONG TERM GOAL #3   Title pt able to  ambulate with good mechanics throughout the day without limitation by pain or need for ankle brace by 12/04/14  states no longer wears brace and is typically not limited by pain; however, does note limited tolerance with prolonged standing activities   Status Partially Met               Problem List Patient Active Problem List   Diagnosis Date Noted  . Atypical chest pain 02/01/2013  . UTI (urinary tract infection) 11/03/2012  . LFT elevation 02/10/2012  . Back pain 06/19/2011  . Migraine with aura 06/19/2011  . Anxiety 02/23/2011  . Fever blister 11/25/2010  . HYPERCHOLESTEROLEMIA 06/14/2008  . NEPHROLITHIASIS 11/23/2007  . Interstitial cystitis 11/18/2007  . GROSS HEMATURIA 11/18/2007  . DIVERTICULOSIS OF COLON 10/26/2007  . 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

## 2014-11-28 LAB — HM MAMMOGRAPHY: HM Mammogram: NORMAL

## 2014-12-03 ENCOUNTER — Ambulatory Visit: Payer: BLUE CROSS/BLUE SHIELD | Admitting: Physical Therapy

## 2014-12-03 DIAGNOSIS — M25571 Pain in right ankle and joints of right foot: Secondary | ICD-10-CM | POA: Diagnosis not present

## 2014-12-03 DIAGNOSIS — R269 Unspecified abnormalities of gait and mobility: Secondary | ICD-10-CM

## 2014-12-03 DIAGNOSIS — R262 Difficulty in walking, not elsewhere classified: Secondary | ICD-10-CM

## 2014-12-03 NOTE — Therapy (Signed)
[facility] [address]  [place], [place], [address] Phone: [phone]   Fax:  [phone]  Physical Therapy Treatment  Patient Details  Name: Hannah [name] MRN: [id number] Date of Birth: 1970-11-04 Referring Provider: Wylene Simmer  Encounter Date: 12/03/2014      PT End of Session - 12/03/14 1104    Visit Number 18   Number of Visits 20   Date for PT Re-Evaluation 12/04/14   PT Start Time 6962   PT Stop Time 1103   PT Time Calculation (min) 40 min      Past Medical History  Diagnosis Date  . Hyperlipidemia   . Other chronic cystitis   . Diverticulosis of colon (without mention of hemorrhage)   . Gross hematuria   . Neoplasm of uncertain behavior of bladder   . Other symptoms involving urinary system(788.99)   . IC (interstitial cystitis)     per Dr. Jacqlyn Larsen, s/p botox injection  . Calculus of kidney     Past Surgical History  Procedure Laterality Date  . Abdominal hysterectomy  10/1999  . Cholecystectomy  10/23/2005    Lap  . Physical therapy  12/1997; 01/06/1998; 03/1998  . Laparoscopy abdomen diagnostic  07/1999    exploration adhesions to ovaries  . Ct abd w & pelvis wo cm  10/09    Thickening and irreg dome of bladder.  Diverticulosis  . Cystoscopy/bladder bx  11/03/07    Path...inflammation.  Hydrodistention Excoriations (Dr. Jacqlyn Larsen)   . Lumbar spine surgery    . Bilateral salpingoophorectomy    . Laparoscopic appendectomy  10/2013    [facility]    There were no vitals filed for this visit.  Visit Diagnosis:  Right ankle pain  Difficulty walking  Abnormality of gait      Subjective Assessment - 12/03/14 1025    Subjective "I think I'm doing pretty good overall"; was on feet 12 hours shopping Friday and was up/down stairs on Saturday and states pain got up to 5/10 at worst without use of medication. States was only a "little achie" in the evening of both of those 2 nights.  Today AVG pain  4/10.  She states frequency of pain has decreased to 20-30% of the time and is therefore pain-free 70-80% of the time.   Currently in Pain? Yes   Pain Score 4   AVG pain 4/10 today when pain is present   Pain Location Ankle   Pain Orientation Right         TODAY'S TREATMENT:  TherEx - Seated BAPS lvl 4: PF/DF 10x with 10# at P; then 10x PF/DF with 5# at each of 4 other locations  Combo: [place] - 20%, 2cm, 0.5W/cm2, 3.3MHz with Premod e-stim (10-'20Hz' ) to lateral aspect of R lateral midfoot/ankle.  Kinesiotape: 3 strips - 50-75% each: Achilles, stirrup, posterior tib/medial arch                PT Short Term Goals - 11/06/14 1148    PT SHORT TERM GOAL #1   Title pt reports AVG pain decreases to 3/10 and worst pain to no greater than 6/10 by 10/15/14   Status On-going   PT SHORT TERM GOAL #2   Title pt independent with initial HEP by 10/15/14   Status Achieved           PT Long Term Goals - 12/03/14 1301    PT LONG TERM GOAL #1   Title pt reports R  foot/ankle pain is intermittent in nature rating no greater than 3/10 at worst and AVG 1-2/10 or better by 12/04/14  good progress; intermittent pain 20-30% of day; AVG pain 4/10, worst pain 5/10 other than short bouts of 6-7/10 pain   Status On-going   PT LONG TERM GOAL #2   Title R Ankle MMT 5/5 all planes without c/o pain with testing by 12/04/14   Status On-going   PT LONG TERM GOAL #3   Title pt able to ambulate with good mechanics throughout the day without limitation by pain or need for ankle brace by 12/04/14  no need for brace; rarely limited by pain lately   Status Partially Met               Plan - 12/03/14 1258    Clinical Impression Statement despite being up on feet a great deal this past holiday weekend, pt reports did not require medication and states pain 5/10 at worst (other than short bouts of 6-7/10 pain).  In addition, she states frequency of pain has decreased to 20-30% of waking hours (she  is pain-free 70-80% of the time).  She did not want to perform standing exercises today due to c/o B knee and hip issues / pain.  Therefore performed seated BAPS and modalities as last treatment.   PT Next Visit Plan progress exercise as able, manual and modalities including needling PRN   Consulted and Agree with Plan of Care Patient        Problem List Patient Active Problem List   Diagnosis Date Noted  . Atypical chest pain 02/01/2013  . UTI (urinary tract infection) 11/03/2012  . LFT elevation 02/10/2012  . Back pain 06/19/2011  . Migraine with aura 06/19/2011  . Anxiety 02/23/2011  . Fever blister 11/25/2010  . HYPERCHOLESTEROLEMIA 06/14/2008  . NEPHROLITHIASIS 11/23/2007  . Interstitial cystitis 11/18/2007  . GROSS HEMATURIA 11/18/2007  . DIVERTICULOSIS OF COLON 10/26/2007  . 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

## 2014-12-05 ENCOUNTER — Ambulatory Visit: Payer: BLUE CROSS/BLUE SHIELD | Admitting: Physical Therapy

## 2014-12-05 DIAGNOSIS — M25571 Pain in right ankle and joints of right foot: Secondary | ICD-10-CM | POA: Diagnosis not present

## 2014-12-05 DIAGNOSIS — R269 Unspecified abnormalities of gait and mobility: Secondary | ICD-10-CM

## 2014-12-05 DIAGNOSIS — R262 Difficulty in walking, not elsewhere classified: Secondary | ICD-10-CM

## 2014-12-05 NOTE — Therapy (Signed)
[facility] [address]  [place], [place], [address] Phone: [phone]   Fax:  [phone]  Physical Therapy Treatment  Patient Details  Name: Hannah [name] MRN: [id number] Date of Birth: 04/29/1970 Referring Provider: Wylene Simmer  Encounter Date: 12/05/2014      PT End of Session - 12/05/14 1109    Visit Number 19   Number of Visits 20   Date for PT Re-Evaluation 12/04/14   PT Start Time 1107   PT Stop Time 1153   PT Time Calculation (min) 46 min      Past Medical History  Diagnosis Date  . Hyperlipidemia   . Other chronic cystitis   . Diverticulosis of colon (without mention of hemorrhage)   . Gross hematuria   . Neoplasm of uncertain behavior of bladder   . Other symptoms involving urinary system(788.99)   . IC (interstitial cystitis)     per Dr. Jacqlyn Larsen, s/p botox injection  . Calculus of kidney     Past Surgical History  Procedure Laterality Date  . Abdominal hysterectomy  10/1999  . Cholecystectomy  10/23/2005    Lap  . Physical therapy  12/1997; 01/06/1998; 03/1998  . Laparoscopy abdomen diagnostic  07/1999    exploration adhesions to ovaries  . Ct abd w & pelvis wo cm  10/09    Thickening and irreg dome of bladder.  Diverticulosis  . Cystoscopy/bladder bx  11/03/07    Path...inflammation.  Hydrodistention Excoriations (Dr. Jacqlyn Larsen)   . Lumbar spine surgery    . Bilateral salpingoophorectomy    . Laparoscopic appendectomy  10/2013    [facility]    There were no vitals filed for this visit.  Visit Diagnosis:  Right ankle pain  Difficulty walking  Abnormality of gait      Subjective Assessment - 12/05/14 1109    Subjective States noted brief bout of intense pain shortly following last treatment up to 7/10 which lasted ~1 hour.  Then pain subsided and back to baseline.  Overall, pt states still feels as though is 40-50% better than when she began PT (this value has varied from 40-60%  improvement lately).  Pt went to massage therapy yesterday and had work done on B lower legs which was well tolerated and felt good.  Currently in no pain.   Currently in Pain? No/denies             TODAY'S TREATMENT:  TherEx - Seated BAPS lvl 4: PF/DF 15x with 10# at P; then 10x PF/DF with 5# at each of 4 other locations  Combo: [place] - 20%, 2cm, 0.5W/cm2, 1.0 MHz with Premod e-stim (10-'20Hz' ) to lateral aspect of R lateral midfoot/ankle x 10'.  Vasopneumatic compression - R foot/ankle, Low Pressure, 10', 38dg        PT Short Term Goals - 11/06/14 1148    PT SHORT TERM GOAL #1   Title pt reports AVG pain decreases to 3/10 and worst pain to no greater than 6/10 by 10/15/14   Status On-going   PT SHORT TERM GOAL #2   Title pt independent with initial HEP by 10/15/14   Status Achieved           PT Long Term Goals - 12/03/14 1301    PT LONG TERM GOAL #1   Title pt reports R foot/ankle pain is intermittent in nature rating no greater than 3/10 at worst and AVG 1-2/10 or better by 12/04/14  good progress; intermittent pain  20-30% of day; AVG pain 4/10, worst pain 5/10 other than short bouts of 6-7/10 pain   Status On-going   PT LONG TERM GOAL #2   Title R Ankle MMT 5/5 all planes without c/o pain with testing by 12/04/14   Status On-going   PT LONG TERM GOAL #3   Title pt able to ambulate with good mechanics throughout the day without limitation by pain or need for ankle brace by 12/04/14  no need for brace; rarely limited by pain lately   Status Partially Met               Plan - 12/05/14 1119    Clinical Impression Statement Due to bout of intense pain following last treatment no progressions with exercise today but pt still willing and motivated to perform exercises.  No pain at start of treatment and today's treatment well tolerated.  Used cold vaso after exercise today to see if prevents pain later today.   PT Next Visit Plan assess for PR next treatment;  continues to progress exercise as able, manual and modalities including needling PRN.   Consulted and Agree with Plan of Care Patient        Problem List Patient Active Problem List   Diagnosis Date Noted  . Atypical chest pain 02/01/2013  . UTI (urinary tract infection) 11/03/2012  . LFT elevation 02/10/2012  . Back pain 06/19/2011  . Migraine with aura 06/19/2011  . Anxiety 02/23/2011  . Fever blister 11/25/2010  . HYPERCHOLESTEROLEMIA 06/14/2008  . NEPHROLITHIASIS 11/23/2007  . Interstitial cystitis 11/18/2007  . GROSS HEMATURIA 11/18/2007  . DIVERTICULOSIS OF COLON 10/26/2007  . NEOPLASM OF UNCERTAIN BEHAVIOR OF BLADDER 10/18/2007    Eila Runyan PT, OCS 12/05/2014, 12:02 PM  [facility] [address]  [place], [place], [address] Phone: [phone]   Fax:  [phone]  Name: Hannah [name] MRN: [id number] Date of Birth: 1970-02-17

## 2014-12-07 ENCOUNTER — Ambulatory Visit: Payer: BLUE CROSS/BLUE SHIELD | Attending: Orthopedic Surgery | Admitting: Physical Therapy

## 2014-12-07 DIAGNOSIS — R269 Unspecified abnormalities of gait and mobility: Secondary | ICD-10-CM | POA: Insufficient documentation

## 2014-12-07 DIAGNOSIS — R262 Difficulty in walking, not elsewhere classified: Secondary | ICD-10-CM | POA: Diagnosis present

## 2014-12-07 DIAGNOSIS — M25571 Pain in right ankle and joints of right foot: Secondary | ICD-10-CM

## 2014-12-07 NOTE — Therapy (Signed)
[facility] [address]  [place], [place], [address] Phone: [phone]   Fax:  [phone]  Physical Therapy Treatment  Patient Details  Name: Hannah [name] MRN: [id number] Date of Birth: [birth year] Referring Provider: Wylene Simmer  Encounter Date: 12/07/2014      [name] End of Session - 12/07/14 1111    Visit Number 20   Number of Visits 26   Date for [name] Re-Evaluation 12/28/14   [name] Start Time 1110   [name] Stop Time 1145   [name] Time Calculation (min) 35 min      Past Medical History  Diagnosis Date  . Hyperlipidemia   . Other chronic cystitis   . Diverticulosis of colon (without mention of hemorrhage)   . Gross hematuria   . Neoplasm of uncertain behavior of bladder   . Other symptoms involving urinary system(788.99)   . IC (interstitial cystitis)     per Dr. Jacqlyn Larsen, s/p botox injection  . Calculus of kidney     Past Surgical History  Procedure Laterality Date  . Abdominal hysterectomy  10/1999  . Cholecystectomy  10/23/2005    Lap  . Physical therapy  12/1997; 01/06/1998; 03/1998  . Laparoscopy abdomen diagnostic  07/1999    exploration adhesions to ovaries  . Ct abd w & pelvis wo cm  10/09    Thickening and irreg dome of bladder.  Diverticulosis  . Cystoscopy/bladder bx  11/03/07    Path...inflammation.  Hydrodistention Excoriations (Dr. Jacqlyn Larsen)   . Lumbar spine surgery    . Bilateral salpingoophorectomy    . Laparoscopic appendectomy  10/2013    [facility]    There were no vitals filed for this visit.  Visit Diagnosis:  Right ankle pain  Difficulty walking  Abnormality of gait      Subjective Assessment - 12/07/14 1111    Subjective States has been doing well since last treatment stating only noted a couple bouts of 2-3/10 pain but otherwise basically pain-free.  States has been pain-free so far today.  She states she is having sinus issues which is causing some nausea today and requests to not perform  any exercises - only wants to lie down to limit nausea.   Currently in Pain? No/denies        TODAY'S TREATMENT:  Combo: [place] - 20%, 2cm, 0.5W/cm2, 1.0 MHz with Premod e-stim (10-'20Hz' ) to lateral aspect of R lateral midfoot/ankle x 10'.   Kinesiotape: 3 strips - 50-75% each: Achilles, stirrup, posterior tib/medial arch            [name] Short Term Goals - 12/07/14 1205    [name] SHORT TERM GOAL #1   Title [name] reports AVG pain decreases to 3/10 and worst pain to no greater than 6/10 by 10/15/14   Status Achieved           [name] Long Term Goals - 12/07/14 1205    [name] LONG TERM GOAL #1   Title [name] reports R foot/ankle pain is intermittent in nature rating no greater than 3/10 at worst and AVG 1-2/10 or better by 12/28/14  very good progress lately with patient stating she is pain-free 70-80% of the time and pain has only been 2-3/10 when present past 2 days and was 4-5/10 over the holiday weekend which included 12 hours of standing and shopping and no need for pain meds   Status Partially Met   [name] LONG TERM GOAL #2   Title R Ankle MMT 5/5  all planes without c/o pain with testing by 12/28/14  not re-assessed today   Status On-going   [name] LONG TERM GOAL #3   Title [name] able to ambulate with good mechanics throughout the day without limitation by pain or need for ankle brace by 12/04/14  hasn't required use of brace or pain medication for over a week but did have pain with ambulation up to 5/10   Status Partially Met               Plan - 12/07/14 1212    Clinical Impression Statement Hannah [name] has been making very good progress over the past 2 weeks.  She was able to walk, cook, shop, and basically spend a great deal of time on her feet over the holiday weekend without need for ankle brace or pain medication.  She states she's pain-free 70-80% of the time.  Pain was up to 5/10 over the weekend but since then has typically been in the 2-3/10 range throughout the rest of the week.  [name] is  pleased with progress and is hopeful that she can continue to improve with [name] interventions.  She has attended 20 visits to date.  I would like to continue for up to 6 more treatments over the next 3-4 weeks to hopefully meet all goals.   [name] will benefit from skilled therapeutic intervention in order to improve on the following deficits Pain;Decreased strength;Decreased balance   Rehab Potential Good   [name] Frequency --  1-2   [name] Duration --  3-4   [name] Treatment/Interventions Therapeutic exercise;Manual techniques;Taping;Therapeutic activities;Vasopneumatic Device;Dry needling;Balance training;Patient/family education;Functional mobility training;Gait training;Electrical Stimulation;Cryotherapy;Ultrasound   [name] Next Visit Plan continues to progress exercise as able; manual and modalities PRN; kinesiotape PRN   Consulted and Agree with Plan of Care Patient        Problem List Patient Active Problem List   Diagnosis Date Noted  . Atypical chest pain 02/01/2013  . UTI (urinary tract infection) 11/03/2012  . LFT elevation 02/10/2012  . Back pain 06/19/2011  . Migraine with aura 06/19/2011  . Anxiety 02/23/2011  . Fever blister 11/25/2010  . HYPERCHOLESTEROLEMIA 06/14/2008  . NEPHROLITHIASIS 11/23/2007  . Interstitial cystitis 11/18/2007  . GROSS HEMATURIA 11/18/2007  . DIVERTICULOSIS OF COLON 10/26/2007  . 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

## 2014-12-11 ENCOUNTER — Ambulatory Visit: Payer: BLUE CROSS/BLUE SHIELD | Admitting: Physical Therapy

## 2014-12-11 DIAGNOSIS — M25571 Pain in right ankle and joints of right foot: Secondary | ICD-10-CM | POA: Diagnosis not present

## 2014-12-11 DIAGNOSIS — R269 Unspecified abnormalities of gait and mobility: Secondary | ICD-10-CM

## 2014-12-11 DIAGNOSIS — R262 Difficulty in walking, not elsewhere classified: Secondary | ICD-10-CM

## 2014-12-11 NOTE — Therapy (Signed)
[facility] [address]  [place], [place], [address] Phone: [phone]   Fax:  [phone]  Physical Therapy Treatment  Patient Details  Name: Hannah [name] MRN: [id number] Date of Birth: 08/19/1970 Referring Provider: Wylene Simmer  Encounter Date: 12/11/2014      PT End of Session - 12/11/14 1102    Visit Number 21   Number of Visits 26   Date for PT Re-Evaluation 12/28/14   PT Start Time 1059   PT Stop Time 1133   PT Time Calculation (min) 34 min      Past Medical History  Diagnosis Date  . Hyperlipidemia   . Other chronic cystitis   . Diverticulosis of colon (without mention of hemorrhage)   . Gross hematuria   . Neoplasm of uncertain behavior of bladder   . Other symptoms involving urinary system(788.99)   . IC (interstitial cystitis)     per Dr. Jacqlyn Larsen, s/p botox injection  . Calculus of kidney     Past Surgical History  Procedure Laterality Date  . Abdominal hysterectomy  10/1999  . Cholecystectomy  10/23/2005    Lap  . Physical therapy  12/1997; 01/06/1998; 03/1998  . Laparoscopy abdomen diagnostic  07/1999    exploration adhesions to ovaries  . Ct abd w & pelvis wo cm  10/09    Thickening and irreg dome of bladder.  Diverticulosis  . Cystoscopy/bladder bx  11/03/07    Path...inflammation.  Hydrodistention Excoriations (Dr. Jacqlyn Larsen)   . Lumbar spine surgery    . Bilateral salpingoophorectomy    . Laparoscopic appendectomy  10/2013    [facility]    There were no vitals filed for this visit.  Visit Diagnosis:  Right ankle pain  Difficulty walking  Abnormality of gait      Subjective Assessment - 12/11/14 1100    Subjective States has had some bouts of pain.  States pain frequency continues to improve and she feels as though is 75% improved overall.  She states she's in a rush today and only wants [place] and tape.   Currently in Pain? Yes   Pain Score 2    Pain Location Ankle   Pain Orientation  Right              TODAY'S TREATMENT: Combo: [place] - 20%, 2cm, 0.5W/cm2, 1.0 MHz with Premod e-stim (10-_0 ) to lateral aspect of R lateral midfoot/ankle x 12'.  Kinesiotape: 3 strips - 50-75% each: Achilles, stirrup, posterior tib/medial arch              PT Short Term Goals - 12/07/14 1205    PT SHORT TERM GOAL #1   Title pt reports AVG pain decreases to 3/10 and worst pain to no greater than 6/10 by 10/15/14   Status Achieved           PT Long Term Goals - 12/07/14 1205    PT LONG TERM GOAL #1   Title pt reports R foot/ankle pain is intermittent in nature rating no greater than 3/10 at worst and AVG 1-2/10 or better by 12/28/14  very good progress lately with patient stating she is pain-free 70-80% of the time and pain has only been 2-3/10 when present past 2 days and was 4-5/10 over the holiday weekend which included 12 hours of standing and shopping and no need for pain meds   Status Partially Met   PT LONG TERM GOAL #2   Title R Ankle MMT 5/5  all planes without c/o pain with testing by 12/28/14  not re-assessed today   Status On-going   PT LONG TERM GOAL #3   Title pt able to ambulate with good mechanics throughout the day without limitation by pain or need for ankle brace by 12/04/14  hasn't required use of brace or pain medication for over a week but did have pain with ambulation up to 5/10   Status Partially Met               Plan - 12/11/14 1138    Clinical Impression Statement she reports 75% improvement since beginning PT.  She hasn't been performing exercises in clinic lately due to pt request but is still progressing very well so is not an issue.  Her tolerance with standing and being active are much better.   PT Next Visit Plan continue current POC adding exercises as pt is able   Consulted and Agree with Plan of Care Patient        Problem List Patient Active Problem List   Diagnosis Date Noted  . Atypical chest pain 02/01/2013  .  UTI (urinary tract infection) 11/03/2012  . LFT elevation 02/10/2012  . Back pain 06/19/2011  . Migraine with aura 06/19/2011  . Anxiety 02/23/2011  . Fever blister 11/25/2010  . HYPERCHOLESTEROLEMIA 06/14/2008  . NEPHROLITHIASIS 11/23/2007  . Interstitial cystitis 11/18/2007  . GROSS HEMATURIA 11/18/2007  . DIVERTICULOSIS OF COLON 10/26/2007  . 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  [place], [place], [address] Phone: [phone]   Fax:  [phone]  Name: Hannah [name] MRN: [id number] Date of Birth: 1970-03-23

## 2014-12-12 ENCOUNTER — Ambulatory Visit: Payer: BLUE CROSS/BLUE SHIELD | Admitting: Physical Therapy

## 2014-12-13 ENCOUNTER — Ambulatory Visit: Payer: BLUE CROSS/BLUE SHIELD | Admitting: Physical Therapy

## 2014-12-13 DIAGNOSIS — M25571 Pain in right ankle and joints of right foot: Secondary | ICD-10-CM

## 2014-12-13 DIAGNOSIS — R269 Unspecified abnormalities of gait and mobility: Secondary | ICD-10-CM

## 2014-12-13 DIAGNOSIS — R262 Difficulty in walking, not elsewhere classified: Secondary | ICD-10-CM

## 2014-12-13 NOTE — Therapy (Signed)
[facility] [address]  [place], [place], [address] Phone: [phone]   Fax:  [phone]  Physical Therapy Treatment  Patient Details  Name: NAIKA NOTO MRN: [id number] Date of Birth: [birth year] Referring Provider: Wylene Simmer  Encounter Date: 12/13/2014      PT End of Session - 12/13/14 1534    Visit Number 22   Number of Visits 26   Date for PT Re-Evaluation 12/28/14   PT Start Time 2409   PT Stop Time 1607   PT Time Calculation (min) 34 min      Past Medical History  Diagnosis Date  . Hyperlipidemia   . Other chronic cystitis   . Diverticulosis of colon (without mention of hemorrhage)   . Gross hematuria   . Neoplasm of uncertain behavior of bladder   . Other symptoms involving urinary system(788.99)   . IC (interstitial cystitis)     per Dr. Jacqlyn Larsen, s/p botox injection  . Calculus of kidney     Past Surgical History  Procedure Laterality Date  . Abdominal hysterectomy  10/1999  . Cholecystectomy  10/23/2005    Lap  . Physical therapy  12/1997; 01/06/1998; 03/1998  . Laparoscopy abdomen diagnostic  07/1999    exploration adhesions to ovaries  . Ct abd w & pelvis wo cm  10/09    Thickening and irreg dome of bladder.  Diverticulosis  . Cystoscopy/bladder bx  11/03/07    Path...inflammation.  Hydrodistention Excoriations (Dr. Jacqlyn Larsen)   . Lumbar spine surgery    . Bilateral salpingoophorectomy    . Laparoscopic appendectomy  10/2013    [facility]    There were no vitals filed for this visit.  Visit Diagnosis:  Right ankle pain  Difficulty walking  Abnormality of gait      Subjective Assessment - 12/13/14 1536    Subjective States still feeling good stating has only noted a couple twinges of pain today. Rates pain 1-2/10 today.   Currently in Pain? Yes   Pain Score --  1-2/10   Pain Location Ankle   Pain Orientation Right            TODAY'S TREATMENT: Combo: [place] - 20%, 2cm,  0.5W/cm2, 1.0 MHz with Premod e-stim (10-'20Hz' ) to lateral aspect of R lateral midfoot/ankle x 13' - added 1' to treatment due to pt noting mild ache a little inferior to lateral malleolus so moved down there and added 1' to allow treatment there.  Kinesiotape: 3 strips - 50-75% each: Achilles, stirrup, posterior tib/medial arch             PT Short Term Goals - 12/07/14 1205    PT SHORT TERM GOAL #1   Title pt reports AVG pain decreases to 3/10 and worst pain to no greater than 6/10 by 10/15/14   Status Achieved           PT Long Term Goals - 12/07/14 1205    PT LONG TERM GOAL #1   Title pt reports R foot/ankle pain is intermittent in nature rating no greater than 3/10 at worst and AVG 1-2/10 or better by 12/28/14  very good progress lately with patient stating she is pain-free 70-80% of the time and pain has only been 2-3/10 when present past 2 days and was 4-5/10 over the holiday weekend which included 12 hours of standing and shopping and no need for pain meds   Status Partially Met   PT LONG TERM GOAL #2  Title R Ankle MMT 5/5 all planes without c/o pain with testing by 12/28/14  not re-assessed today   Status On-going   PT LONG TERM GOAL #3   Title pt able to ambulate with good mechanics throughout the day without limitation by pain or need for ankle brace by 12/04/14  hasn't required use of brace or pain medication for over a week but did have pain with ambulation up to 5/10   Status Partially Met               Plan - 12/13/14 1611    Clinical Impression Statement pt progressing exceptionally well lately.  Minimal to no pain.  States is walking a lot and going up/down stairs in house without issue lately.  She continues to note mild anteriolateral ankle pain when supports foot on heel.  This likely causing PA pressure and strain to ATF ligament which may be reason for pain.   PT Next Visit Plan continue current POC adding exercises as pt is able   Consulted and  Agree with Plan of Care Patient        Problem List Patient Active Problem List   Diagnosis Date Noted  . Atypical chest pain 02/01/2013  . UTI (urinary tract infection) 11/03/2012  . LFT elevation 02/10/2012  . Back pain 06/19/2011  . Migraine with aura 06/19/2011  . Anxiety 02/23/2011  . Fever blister 11/25/2010  . HYPERCHOLESTEROLEMIA 06/14/2008  . NEPHROLITHIASIS 11/23/2007  . Interstitial cystitis 11/18/2007  . GROSS HEMATURIA 11/18/2007  . DIVERTICULOSIS OF COLON 10/26/2007  . 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

## 2014-12-17 ENCOUNTER — Ambulatory Visit: Payer: BLUE CROSS/BLUE SHIELD | Admitting: Physical Therapy

## 2014-12-17 DIAGNOSIS — R262 Difficulty in walking, not elsewhere classified: Secondary | ICD-10-CM

## 2014-12-17 DIAGNOSIS — R269 Unspecified abnormalities of gait and mobility: Secondary | ICD-10-CM

## 2014-12-17 DIAGNOSIS — M25571 Pain in right ankle and joints of right foot: Secondary | ICD-10-CM | POA: Diagnosis not present

## 2014-12-17 NOTE — Therapy (Signed)
[facility] [address]  [place], [place], [address] Phone: [phone]   Fax:  [phone]  Physical Therapy Treatment  Patient Details  Name: Hannah [name] MRN: [id number] Date of Birth: October 06, 1970 Referring Provider: Wylene Simmer  Encounter Date: 12/17/2014      PT End of Session - 12/17/14 1322    Visit Number 23   Number of Visits 26   Date for PT Re-Evaluation 12/28/14   PT Start Time 1320   PT Stop Time 1355   PT Time Calculation (min) 35 min      Past Medical History  Diagnosis Date  . Hyperlipidemia   . Other chronic cystitis   . Diverticulosis of colon (without mention of hemorrhage)   . Gross hematuria   . Neoplasm of uncertain behavior of bladder   . Other symptoms involving urinary system(788.99)   . IC (interstitial cystitis)     per Dr. Jacqlyn Larsen, s/p botox injection  . Calculus of kidney     Past Surgical History  Procedure Laterality Date  . Abdominal hysterectomy  10/1999  . Cholecystectomy  10/23/2005    Lap  . Physical therapy  12/1997; 01/06/1998; 03/1998  . Laparoscopy abdomen diagnostic  07/1999    exploration adhesions to ovaries  . Ct abd w & pelvis wo cm  10/09    Thickening and irreg dome of bladder.  Diverticulosis  . Cystoscopy/bladder bx  11/03/07    Path...inflammation.  Hydrodistention Excoriations (Dr. Jacqlyn Larsen)   . Lumbar spine surgery    . Bilateral salpingoophorectomy    . Laparoscopic appendectomy  10/2013    [facility]    There were no vitals filed for this visit.  Visit Diagnosis:  Right ankle pain  Difficulty walking  Abnormality of gait      Subjective Assessment - 12/17/14 1323    Subjective On feet a great deal over the weekend and is more sore.  States has been 5/10 pain since yesterday.  Pain still located anterior aspect of lateral ankle and foot. States took ibuprofen last night due to pain.   Currently in Pain? Yes   Pain Score 5    Pain Location Ankle    Pain Orientation Right              TODAY'S TREATMENT: Combo: [place] - 20%, 5cm, 0.5W/cm2, 1.0 MHz with Premod e-stim (10-_0 ) to lateral aspect of R lateral midfoot/ankle x 12' (focused on ATF Ligament today)  Kinesiotape: 3 strips - 50-75% each: Achilles, stirrup, posterior tib/medial arch                      PT Short Term Goals - 12/07/14 1205    PT SHORT TERM GOAL #1   Title pt reports AVG pain decreases to 3/10 and worst pain to no greater than 6/10 by 10/15/14   Status Achieved           PT Long Term Goals - 12/07/14 1205    PT LONG TERM GOAL #1   Title pt reports R foot/ankle pain is intermittent in nature rating no greater than 3/10 at worst and AVG 1-2/10 or better by 12/28/14  very good progress lately with patient stating she is pain-free 70-80% of the time and pain has only been 2-3/10 when present past 2 days and was 4-5/10 over the holiday weekend which included 12 hours of standing and shopping and no need for pain meds   Status Partially  Met   PT LONG TERM GOAL #2   Title R Ankle MMT 5/5 all planes without c/o pain with testing by 12/28/14  not re-assessed today   Status On-going   PT LONG TERM GOAL #3   Title pt able to ambulate with good mechanics throughout the day without limitation by pain or need for ankle brace by 12/04/14  hasn't required use of brace or pain medication for over a week but did have pain with ambulation up to 5/10   Status Partially Met               Plan - 12/17/14 1357    Clinical Impression Statement pt with increased soreness in R lateral ankle/foot after spending another weekend on her feet.  She states her ability to stand is still dratically improved, but she note pain beginning yesterday and continuing into today rating 5/10.  Pain is noted in area of R ATF ligament and into lateral foot.   PT Next Visit Plan continue current POC adding exercises as pt is able   Consulted and Agree with Plan of Care  Patient        Problem List Patient Active Problem List   Diagnosis Date Noted  . Atypical chest pain 02/01/2013  . UTI (urinary tract infection) 11/03/2012  . LFT elevation 02/10/2012  . Back pain 06/19/2011  . Migraine with aura 06/19/2011  . Anxiety 02/23/2011  . Fever blister 11/25/2010  . HYPERCHOLESTEROLEMIA 06/14/2008  . NEPHROLITHIASIS 11/23/2007  . Interstitial cystitis 11/18/2007  . GROSS HEMATURIA 11/18/2007  . DIVERTICULOSIS OF COLON 10/26/2007  . 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, [place], [address] Phone: [phone]   Fax:  [phone]  Name: Hannah [name] MRN: [id number] Date of Birth: Sep 13, 1970

## 2014-12-18 ENCOUNTER — Ambulatory Visit: Payer: BLUE CROSS/BLUE SHIELD | Admitting: Physical Therapy

## 2014-12-20 ENCOUNTER — Ambulatory Visit: Payer: BLUE CROSS/BLUE SHIELD | Admitting: Physical Therapy

## 2014-12-20 DIAGNOSIS — R262 Difficulty in walking, not elsewhere classified: Secondary | ICD-10-CM

## 2014-12-20 DIAGNOSIS — M25571 Pain in right ankle and joints of right foot: Secondary | ICD-10-CM | POA: Diagnosis not present

## 2014-12-20 DIAGNOSIS — R269 Unspecified abnormalities of gait and mobility: Secondary | ICD-10-CM

## 2014-12-20 NOTE — Therapy (Signed)
[facility] [address]  [place], [place], [address] Phone: [phone]   Fax:  [phone]  Physical Therapy Treatment  Patient Details  Name: Hannah [name] MRN: [id number] Date of Birth: 1970-06-25 Referring Provider: Wylene Simmer  Encounter Date: 12/20/2014      PT End of Session - 12/20/14 1103    Visit Number 24   Number of Visits 26   Date for PT Re-Evaluation 12/28/14   PT Start Time 1103   PT Stop Time 1150   PT Time Calculation (min) 47 min      Past Medical History  Diagnosis Date  . Hyperlipidemia   . Other chronic cystitis   . Diverticulosis of colon (without mention of hemorrhage)   . Gross hematuria   . Neoplasm of uncertain behavior of bladder   . Other symptoms involving urinary system(788.99)   . IC (interstitial cystitis)     per Dr. Jacqlyn Larsen, s/p botox injection  . Calculus of kidney     Past Surgical History  Procedure Laterality Date  . Abdominal hysterectomy  10/1999  . Cholecystectomy  10/23/2005    Lap  . Physical therapy  12/1997; 01/06/1998; 03/1998  . Laparoscopy abdomen diagnostic  07/1999    exploration adhesions to ovaries  . Ct abd w & pelvis wo cm  10/09    Thickening and irreg dome of bladder.  Diverticulosis  . Cystoscopy/bladder bx  11/03/07    Path...inflammation.  Hydrodistention Excoriations (Dr. Jacqlyn Larsen)   . Lumbar spine surgery    . Bilateral salpingoophorectomy    . Laparoscopic appendectomy  10/2013    [facility]    There were no vitals filed for this visit.  Visit Diagnosis:  Right ankle pain  Difficulty walking  Abnormality of gait      Subjective Assessment - 12/20/14 1104    Subjective increased pain again today.  States "have been on it a ton lately".  States pain up to 5-6/10 today and 6/10 yesterday.   Currently in Pain? Yes   Pain Score --  5-6/10   Pain Location Foot   Pain Orientation Right;Lateral             TODAY'S  TREATMENT: Manual - R Ankle talocrural distraction grade 2; R LE nerve glides in PF and DF  Combo: [place] - 20%, 5cm, 0.5W/cm2, 1.0 MHz with Premod e-stim (10-'20Hz' ) to lateral aspect of R lateral ankle to MTP x 12'  Kinesiotape: 4 strips - 75% lateral aspect of anterior foot/ankle up anteriolateral lower leg then 50-75% each: Achilles, stirrup, posterior tib/medial arch          PT Short Term Goals - 12/07/14 1205    PT SHORT TERM GOAL #1   Title pt reports AVG pain decreases to 3/10 and worst pain to no greater than 6/10 by 10/15/14   Status Achieved           PT Long Term Goals - 12/07/14 1205    PT LONG TERM GOAL #1   Title pt reports R foot/ankle pain is intermittent in nature rating no greater than 3/10 at worst and AVG 1-2/10 or better by 12/28/14  very good progress lately with patient stating she is pain-free 70-80% of the time and pain has only been 2-3/10 when present past 2 days and was 4-5/10 over the holiday weekend which included 12 hours of standing and shopping and no need for pain meds   Status Partially Met  PT LONG TERM GOAL #2   Title R Ankle MMT 5/5 all planes without c/o pain with testing by 12/28/14  not re-assessed today   Status On-going   PT LONG TERM GOAL #3   Title pt able to ambulate with good mechanics throughout the day without limitation by pain or need for ankle brace by 12/04/14  hasn't required use of brace or pain medication for over a week but did have pain with ambulation up to 5/10   Status Partially Met               Plan - 12/20/14 1157    Clinical Impression Statement pt still with increased pain and today pain extends further distally into lateral foot to MCPs of lateral 3 toes.  There is tenderness from lateral aspect of anterior talocrural joint to the lateral MTPs and I'm thinking her pain may have be chiefly nerve in origin at this point.  Performed some nerve glides to R LE followed by UE and taping.  Encouraged pt to perform  R ankle AROM activities regularly.   PT Next Visit Plan continue LE nerve glides as well as taping and [place] if continued benefit.    Consulted and Agree with Plan of Care Patient        Problem List Patient Active Problem List   Diagnosis Date Noted  . Atypical chest pain 02/01/2013  . UTI (urinary tract infection) 11/03/2012  . LFT elevation 02/10/2012  . Back pain 06/19/2011  . Migraine with aura 06/19/2011  . Anxiety 02/23/2011  . Fever blister 11/25/2010  . HYPERCHOLESTEROLEMIA 06/14/2008  . NEPHROLITHIASIS 11/23/2007  . Interstitial cystitis 11/18/2007  . GROSS HEMATURIA 11/18/2007  . DIVERTICULOSIS OF COLON 10/26/2007  . 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  [place], [place], [address] Phone: [phone]   Fax:  [phone]  Name: Hannah [name] MRN: [id number] Date of Birth: Feb 16, 1970

## 2014-12-25 ENCOUNTER — Ambulatory Visit: Payer: BLUE CROSS/BLUE SHIELD | Admitting: Physical Therapy

## 2014-12-25 ENCOUNTER — Encounter: Payer: Self-pay | Admitting: Family Medicine

## 2014-12-25 DIAGNOSIS — M25571 Pain in right ankle and joints of right foot: Secondary | ICD-10-CM | POA: Diagnosis not present

## 2014-12-25 DIAGNOSIS — R262 Difficulty in walking, not elsewhere classified: Secondary | ICD-10-CM

## 2014-12-25 DIAGNOSIS — R269 Unspecified abnormalities of gait and mobility: Secondary | ICD-10-CM

## 2014-12-25 NOTE — Therapy (Signed)
[facility] [address]  [place], [place], [address] Phone: [phone]   Fax:  [phone]  Physical Therapy Treatment  Patient Details  Name: Hannah [name] MRN: [id number] Date of Birth: [birth year] Referring Provider: Wylene Simmer  Encounter Date: 12/25/2014      PT End of Session - 12/25/14 1057    Visit Number 25   Number of Visits 26   Date for PT Re-Evaluation 12/28/14   PT Start Time 1056   PT Stop Time 1142   PT Time Calculation (min) 46 min      Past Medical History  Diagnosis Date  . Hyperlipidemia   . Other chronic cystitis   . Diverticulosis of colon (without mention of hemorrhage)   . Gross hematuria   . Neoplasm of uncertain behavior of bladder   . Other symptoms involving urinary system(788.99)   . IC (interstitial cystitis)     per Dr. Jacqlyn Larsen, s/p botox injection  . Calculus of kidney     Past Surgical History  Procedure Laterality Date  . Abdominal hysterectomy  10/1999  . Cholecystectomy  10/23/2005    Lap  . Physical therapy  12/1997; 01/06/1998; 03/1998  . Laparoscopy abdomen diagnostic  07/1999    exploration adhesions to ovaries  . Ct abd w & pelvis wo cm  10/09    Thickening and irreg dome of bladder.  Diverticulosis  . Cystoscopy/bladder bx  11/03/07    Path...inflammation.  Hydrodistention Excoriations (Dr. Jacqlyn Larsen)   . Lumbar spine surgery    . Bilateral salpingoophorectomy    . Laparoscopic appendectomy  10/2013    [facility]    There were no vitals filed for this visit.  Visit Diagnosis:  Right ankle pain  Difficulty walking  Abnormality of gait      Subjective Assessment - 12/25/14 1057    Subjective States was in significant pain following last treatment but states this decreased throughout the day and felt better the following day. She rates pain 4/10 lately (when pain is present) and she states she notes pain approx 20% of the day lately but increases up to 50% when  on feet a great deal on the weekends.   Currently in Pain? Yes   Pain Score 4    Pain Location Foot   Pain Orientation Right;Lateral            TODAY'S TREATMENT: Manual - IASTM R Achilles insertion and to R anterior foot and ankle as well as lateral ankle; grade 3 mobes into DF  Combo: [place] - 20%, 5cm, 0.5W/cm2, 1.0 MHz with Premod e-stim (10-20Hz) to lateral aspect of R lateral ankle to MTP x 12'  Kinesiotape: 2 strips - 50% Achilles and 25% long stirrup.                      PT Short Term Goals - 12/07/14 1205    PT SHORT TERM GOAL #1   Title pt reports AVG pain decreases to 3/10 and worst pain to no greater than 6/10 by 10/15/14   Status Achieved           PT Long Term Goals - 12/07/14 1205    PT LONG TERM GOAL #1   Title pt reports R foot/ankle pain is intermittent in nature rating no greater than 3/10 at worst and AVG 1-2/10 or better by 12/28/14  very good progress lately with patient stating she is pain-free 70-80% of the time and  pain has only been 2-3/10 when present past 2 days and was 4-5/10 over the holiday weekend which included 12 hours of standing and shopping and no need for pain meds   Status Partially Met   PT LONG TERM GOAL #2   Title R Ankle MMT 5/5 all planes without c/o pain with testing by 12/28/14  not re-assessed today   Status On-going   PT LONG TERM GOAL #3   Title pt able to ambulate with good mechanics throughout the day without limitation by pain or need for ankle brace by 12/04/14  hasn't required use of brace or pain medication for over a week but did have pain with ambulation up to 5/10   Status Partially Met               Plan - 12/25/14 1152    Clinical Impression Statement no significant changes lately; AVG pain is 4/10 when present; states pain is noted 20% of the day most days but up to 50% of the day when on feet a great deal.  Performed some IASTM today to see if benefit noted.   PT Next Visit Plan  re-assess   Consulted and Agree with Plan of Care Patient        Problem List Patient Active Problem List   Diagnosis Date Noted  . Atypical chest pain 02/01/2013  . UTI (urinary tract infection) 11/03/2012  . LFT elevation 02/10/2012  . Back pain 06/19/2011  . Migraine with aura 06/19/2011  . Anxiety 02/23/2011  . Fever blister 11/25/2010  . HYPERCHOLESTEROLEMIA 06/14/2008  . NEPHROLITHIASIS 11/23/2007  . Interstitial cystitis 11/18/2007  . GROSS HEMATURIA 11/18/2007  . DIVERTICULOSIS OF COLON 10/26/2007  . 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

## 2014-12-26 ENCOUNTER — Ambulatory Visit: Payer: BLUE CROSS/BLUE SHIELD | Admitting: Physical Therapy

## 2014-12-27 ENCOUNTER — Ambulatory Visit: Payer: BLUE CROSS/BLUE SHIELD | Admitting: Physical Therapy

## 2015-01-02 ENCOUNTER — Ambulatory Visit: Payer: BLUE CROSS/BLUE SHIELD | Admitting: Physical Therapy

## 2015-01-04 ENCOUNTER — Ambulatory Visit: Payer: BLUE CROSS/BLUE SHIELD | Admitting: Physical Therapy

## 2015-01-04 DIAGNOSIS — R269 Unspecified abnormalities of gait and mobility: Secondary | ICD-10-CM

## 2015-01-04 DIAGNOSIS — M25571 Pain in right ankle and joints of right foot: Secondary | ICD-10-CM | POA: Diagnosis not present

## 2015-01-04 DIAGNOSIS — R262 Difficulty in walking, not elsewhere classified: Secondary | ICD-10-CM

## 2015-01-04 NOTE — Therapy (Signed)
[facility] [address]  [place], [place], [address] Phone: [phone]   Fax:  [phone]  Physical Therapy Treatment  Patient Details  Name: Hannah [name] MRN: [id number] Date of Birth: 1970/10/27 Referring Provider: Wylene Simmer  Encounter Date: 01/04/2015      PT End of Session - 01/04/15 1020    Visit Number 26   Number of Visits 30   Date for PT Re-Evaluation 02/01/15   PT Start Time 1019   PT Stop Time 1102   PT Time Calculation (min) 43 min      Past Medical History  Diagnosis Date  . Hyperlipidemia   . Other chronic cystitis   . Diverticulosis of colon (without mention of hemorrhage)   . Gross hematuria   . Neoplasm of uncertain behavior of bladder   . Other symptoms involving urinary system(788.99)   . IC (interstitial cystitis)     per Dr. Jacqlyn Larsen, s/p botox injection  . Calculus of kidney     Past Surgical History  Procedure Laterality Date  . Abdominal hysterectomy  10/1999  . Cholecystectomy  10/23/2005    Lap  . Physical therapy  12/1997; 01/06/1998; 03/1998  . Laparoscopy abdomen diagnostic  07/1999    exploration adhesions to ovaries  . Ct abd w & pelvis wo cm  10/09    Thickening and irreg dome of bladder.  Diverticulosis  . Cystoscopy/bladder bx  11/03/07    Path...inflammation.  Hydrodistention Excoriations (Dr. Jacqlyn Larsen)   . Lumbar spine surgery    . Bilateral salpingoophorectomy    . Laparoscopic appendectomy  10/2013    [facility]    There were no vitals filed for this visit.  Visit Diagnosis:  Right ankle pain - Plan: PT plan of care cert/re-cert  Difficulty walking - Plan: PT plan of care cert/re-cert  Abnormality of gait - Plan: PT plan of care cert/re-cert      Subjective Assessment - 01/04/15 1020    Subjective States ankle feels as though a little better.  Stating either pain-free or 7-8/10 pain lately.  States pain is present 20-25% of the time on AVG and 40% of time  when on feet a lot.  States feels 75-80% back to normal and is pleased with this but is wanting to continue to improve.  States no longer wakes her and pain is quite intermittent.   Currently in Pain? Yes   Pain Score 4    Pain Location Foot   Pain Orientation Right;Lateral            [facility] PT Assessment - 01/04/15 0001    Strength   Right Ankle Dorsiflexion 5/5   Right Ankle Inversion 4/5   Right Ankle Eversion 4+/5        TODAY'S TREATMENT: TherEx - Standing on Foam Pad toe tapping to 8" step 15x each Foam Beam tandem Gait FW/BW 3 laps MiniTramp Heel/Toe Raise 8x (stopped due to increasing lateral foot/ankle pain) Lateral lunges onto BOSU (up) 10x with R  Manual - IASTM R Achilles insertion and to R anterior foot and ankle as well as lateral ankle; grade 3 mobes into DF  [place] - 20%, 5cm, 0.5W/cm2, 1.0 MHz, 8' lateral R foot and ankle  Kinesiotape: 2 strips - 50% Achilles and 25% long stirrup.          PT Short Term Goals - 12/07/14 1205    PT SHORT TERM GOAL #1   Title pt reports AVG pain  decreases to 3/10 and worst pain to no greater than 6/10 by 10/15/14   Status Achieved           PT Long Term Goals - 01/04/15 1227    PT LONG TERM GOAL #1   Title pt reports R foot/ankle pain is intermittent in nature rating no greater than 3/10 at worst and AVG 1-2/10 or better by 02/01/15  pain-free 75-80% of the time but pain up to 7/10 recently   Status Partially Met   PT LONG TERM GOAL #2   Title R Ankle MMT 5/5 all planes without c/o pain with testing by 02/01/15  IV 4/5, PF and EV 4+/5, DF 5/5   Status Partially Met   PT LONG TERM GOAL #3   Title pt able to ambulate with good mechanics throughout the day without limitation by pain or need for ankle brace by 12/04/14  no need for brace; intermittent/infrequent pain but up to 7/10 when present with prolonged standing/walking   Status Partially Met               Plan - 01/04/15 1211    Clinical Impression  Statement Hannah [name] states that she feels as though is 75-80% back to normal and is very pleased with her progress. She states she would have been happy with 50% but now that she's feeling better she's hoping to achieve closer to 100% resolution of her symptoms.  She states she is pain-free 75-80% of the time but that pain can get up to 7-8/10 when present.  Primary pain continues to be located to lateral foot (running from anterior aspect of lateral malleolus to MTPs of #3-5 toes) and intermittent pain noted to peroneal tendons.  Her Ankle MMT has improved to: DF 5/5, PF 4/5, IV 4/5, and EV 4+/5.  She notes pain with EV and IV and clicking with PF.  She has been able to wear a wider variety of shoes lately without increase in symptoms and most pain is noted with prolonged standing (was up on feet cooking a great deal over the holidatys).  She performs some ankle exercises at home and performs some stability exercises in the clinic but bulk of treatments have focused on manual and modalities along with use of kinesiology tape.  She has attended 26 PT treatments to date and would like to continue (6 treatments since her last re-assessment and POC extension on 12/07/14).  Since she continues to improve I am willing to continue treating.  The plan is to progress to more and more stability and strength training to allow improved tolerance with standing activities.  I 'm recommending 4 more weeks at 1-2 treatments/wk.   Pt will benefit from skilled therapeutic intervention in order to improve on the following deficits Pain;Decreased strength;Decreased balance   Rehab Potential Good   PT Frequency --  1-2x/wk   PT Duration 4 weeks   PT Treatment/Interventions Therapeutic exercise;Manual techniques;Taping;Therapeutic activities;Vasopneumatic Device;Dry needling;Balance training;Patient/family education;Functional mobility training;Gait training;Electrical Stimulation;Cryotherapy;Ultrasound   Consulted and Agree with  Plan of Care Patient        Problem List Patient Active Problem List   Diagnosis Date Noted  . Atypical chest pain 02/01/2013  . UTI (urinary tract infection) 11/03/2012  . LFT elevation 02/10/2012  . Back pain 06/19/2011  . Migraine with aura 06/19/2011  . Anxiety 02/23/2011  . Fever blister 11/25/2010  . HYPERCHOLESTEROLEMIA 06/14/2008  . NEPHROLITHIASIS 11/23/2007  . Interstitial cystitis 11/18/2007  . GROSS HEMATURIA 11/18/2007  . DIVERTICULOSIS  OF COLON 10/26/2007  . 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

## 2015-01-08 ENCOUNTER — Ambulatory Visit: Payer: 59 | Attending: Orthopedic Surgery | Admitting: Physical Therapy

## 2015-01-08 DIAGNOSIS — R262 Difficulty in walking, not elsewhere classified: Secondary | ICD-10-CM | POA: Insufficient documentation

## 2015-01-08 DIAGNOSIS — M25571 Pain in right ankle and joints of right foot: Secondary | ICD-10-CM | POA: Insufficient documentation

## 2015-01-08 DIAGNOSIS — R269 Unspecified abnormalities of gait and mobility: Secondary | ICD-10-CM | POA: Diagnosis present

## 2015-01-08 NOTE — Therapy (Signed)
[facility] [address]  [place], [place], [address] Phone: [phone]   Fax:  [phone]  Physical Therapy Treatment  Patient Details  Name: Hannah [name] MRN: [id number] Date of Birth: 12-16-1970 Referring Provider: Wylene Simmer  Encounter Date: 01/08/2015      PT End of Session - 01/08/15 1016    Visit Number 27   Number of Visits 30  to 34   Date for PT Re-Evaluation 02/01/15   PT Start Time 9470   PT Stop Time 1054   PT Time Calculation (min) 40 min      Past Medical History  Diagnosis Date  . Hyperlipidemia   . Other chronic cystitis   . Diverticulosis of colon (without mention of hemorrhage)   . Gross hematuria   . Neoplasm of uncertain behavior of bladder   . Other symptoms involving urinary system(788.99)   . IC (interstitial cystitis)     per Dr. Jacqlyn Larsen, s/p botox injection  . Calculus of kidney     Past Surgical History  Procedure Laterality Date  . Abdominal hysterectomy  10/1999  . Cholecystectomy  10/23/2005    Lap  . Physical therapy  12/1997; 01/06/1998; 03/1998  . Laparoscopy abdomen diagnostic  07/1999    exploration adhesions to ovaries  . Ct abd w & pelvis wo cm  10/09    Thickening and irreg dome of bladder.  Diverticulosis  . Cystoscopy/bladder bx  11/03/07    Path...inflammation.  Hydrodistention Excoriations (Dr. Jacqlyn Larsen)   . Lumbar spine surgery    . Bilateral salpingoophorectomy    . Laparoscopic appendectomy  10/2013    [facility]    There were no vitals filed for this visit.  Visit Diagnosis:  Right ankle pain  Difficulty walking  Abnormality of gait      Subjective Assessment - 01/08/15 1016    Subjective states did not note increased pain following last treatment and in fact was pain-free for a couple days.  She states she has noticed she notes pain if she sits with ankle in prolonged non-neutral position.  Noted recently while in recliner and ankle was inverted.   States most recent tape caused some slight bruising to upper calf - states still wants tape just shorter today.   Currently in Pain? Yes   Pain Score --  3-4/10   Pain Location Foot   Pain Orientation Right;Lateral   Pain Descriptors / Indicators Aching            TODAY'S TREATMENT: Manual - IASTM R Achilles insertion and to R anterior foot and ankle as well as lateral ankle; grade 3 mobes into DF  [place] - 20%, 5cm, 0.5W/cm2, 1.0 MHz, 10' lateral R foot and ankle  Kinesiotape: 3 strips - 50% Achilles, 50% medial arch strip, and 25% stirrup (Achilles and stirrup shorter vs last time).         PT Short Term Goals - 12/07/14 1205    PT SHORT TERM GOAL #1   Title pt reports AVG pain decreases to 3/10 and worst pain to no greater than 6/10 by 10/15/14   Status Achieved           PT Long Term Goals - 01/04/15 1227    PT LONG TERM GOAL #1   Title pt reports R foot/ankle pain is intermittent in nature rating no greater than 3/10 at worst and AVG 1-2/10 or better by 02/01/15  pain-free 75-80% of the time but pain  up to 7/10 recently   Status Partially Met   PT LONG TERM GOAL #2   Title R Ankle MMT 5/5 all planes without c/o pain with testing by 02/01/15  IV 4/5, PF and EV 4+/5, DF 5/5   Status Partially Met   PT LONG TERM GOAL #3   Title pt able to ambulate with good mechanics throughout the day without limitation by pain or need for ankle brace by 12/04/14  no need for brace; intermittent/infrequent pain but up to 7/10 when present with prolonged standing/walking   Status Partially Met               Plan - 01/08/15 1057    Clinical Impression Statement states feels best to date lately stating had a couple days with no pain since last treatment.  States is a little achie today and requests no exercise so treatment kept to manual and modalities.   PT Next Visit Plan progress ankle strength/stability as able; manual and modalities PRN   Consulted and Agree with Plan of  Care Patient        Problem List Patient Active Problem List   Diagnosis Date Noted  . Atypical chest pain 02/01/2013  . UTI (urinary tract infection) 11/03/2012  . LFT elevation 02/10/2012  . Back pain 06/19/2011  . Migraine with aura 06/19/2011  . Anxiety 02/23/2011  . Fever blister 11/25/2010  . HYPERCHOLESTEROLEMIA 06/14/2008  . NEPHROLITHIASIS 11/23/2007  . Interstitial cystitis 11/18/2007  . GROSS HEMATURIA 11/18/2007  . DIVERTICULOSIS OF COLON 10/26/2007  . 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  [place], [place], [address] Phone: [phone]   Fax:  [phone]  Name: Hannah [name] MRN: [id number] Date of Birth: [birth year]

## 2015-01-14 ENCOUNTER — Ambulatory Visit: Payer: 59 | Admitting: Physical Therapy

## 2015-01-18 ENCOUNTER — Ambulatory Visit: Payer: 59 | Admitting: Physical Therapy

## 2015-01-18 DIAGNOSIS — R262 Difficulty in walking, not elsewhere classified: Secondary | ICD-10-CM

## 2015-01-18 DIAGNOSIS — R269 Unspecified abnormalities of gait and mobility: Secondary | ICD-10-CM

## 2015-01-18 DIAGNOSIS — M25571 Pain in right ankle and joints of right foot: Secondary | ICD-10-CM

## 2015-01-18 NOTE — Therapy (Signed)
[facility] [address]  [place], [place], [address] Phone: [phone]   Fax:  [phone]  Physical Therapy Treatment  Patient Details  Name: BLIMIE VANESS MRN: [id number] Date of Birth: 07-19-1970 Referring Provider: Wylene Simmer  Encounter Date: 01/18/2015      PT End of Session - 01/18/15 1023    Visit Number 28   Number of Visits 30   Date for PT Re-Evaluation 02/01/15   PT Start Time 1022   PT Stop Time 1109   PT Time Calculation (min) 47 min      Past Medical History  Diagnosis Date  . Hyperlipidemia   . Other chronic cystitis   . Diverticulosis of colon (without mention of hemorrhage)   . Gross hematuria   . Neoplasm of uncertain behavior of bladder   . Other symptoms involving urinary system(788.99)   . IC (interstitial cystitis)     per Dr. Jacqlyn Larsen, s/p botox injection  . Calculus of kidney     Past Surgical History  Procedure Laterality Date  . Abdominal hysterectomy  10/1999  . Cholecystectomy  10/23/2005    Lap  . Physical therapy  12/1997; 01/06/1998; 03/1998  . Laparoscopy abdomen diagnostic  07/1999    exploration adhesions to ovaries  . Ct abd w & pelvis wo cm  10/09    Thickening and irreg dome of bladder.  Diverticulosis  . Cystoscopy/bladder bx  11/03/07    Path...inflammation.  Hydrodistention Excoriations (Dr. Jacqlyn Larsen)   . Lumbar spine surgery    . Bilateral salpingoophorectomy    . Laparoscopic appendectomy  10/2013    [facility]    There were no vitals filed for this visit.  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  States pain up to 7/10 and states pain has worsened to constant in nature since yesterday.  States tried heating ankle and took ibuprofen but still with constant pain since yesterday.  Pain is lateral foot running to  #2-4 MTP as in the past.  States has not been wearing ankle brace.  States is sleeping okay.   Currently in Pain? Yes   Pain Score 7   dull 3-4/10 at rest, 7/10 with motion   Pain Location Foot   Pain Orientation Right;Lateral                 TODAY'S TREATMENT: Manual - R LE nerve glides; R fibular head PA mobes grade 2 and 3 pt prone and knee flexed; TPR R piriformis with pt prone; grade 3 mobes into R Ankle DF  [place] - 20%, 5cm, 0.5W/cm2, 1.0 MHz, 8' lateral R foot and ankle  Kinesiotape: 2 strips - 50% Achilles and 25% stirrup                PT Education - 01/18/15 1224    Education provided Yes   Education Details piriformis stretch and R LE nerve glides 3x / day   Person(s) Educated Patient   Methods Explanation;Demonstration   Comprehension Verbalized understanding;Returned demonstration          PT Short Term Goals - 12/07/14 1205    PT SHORT TERM GOAL #1   Title pt reports AVG pain decreases to 3/10 and worst pain to no greater than 6/10 by 10/15/14   Status  Achieved           PT Long Term Goals - 01/04/15 1227    PT LONG TERM GOAL #1   Title pt reports R foot/ankle pain is intermittent in nature rating no greater than 3/10 at worst and AVG 1-2/10 or better by 02/01/15  pain-free 75-80% of the time but pain up to 7/10 recently   Status Partially Met   PT LONG TERM GOAL #2   Title R Ankle MMT 5/5 all planes without c/o pain with testing by 02/01/15  IV 4/5, PF and EV 4+/5, DF 5/5   Status Partially Met   PT LONG TERM GOAL #3   Title pt able to ambulate with good mechanics throughout the day without limitation by pain or need for ankle brace by 12/04/14  no need for brace; intermittent/infrequent pain but up to 7/10 when present with prolonged standing/walking   Status Partially Met               Plan - 01/18/15 1224    Clinical Impression Statement pt with c/o increased pain.  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  Pain is top and lateral aspect of R foot and ankle extending from anterior aspect of lateral malleolus to #2-4 MTP.  No significant joint pain at MTPs with mobs.so does not seem MTP in nature.  TTP along this same distribution; however, tenderness to palpation continues along lateral lower leg to fibular head to back of knee and up posterior thigh all the way to ILA of R sacrum.  No tenderness noted on L LE.  Due to tenderness throughout entire R sciatic nerve and its continuation into lower leg I'm beginning to think her foot/ankle pain may be more neurogenic and is proximal to ankle.  Performed manual at ankle, knee, and piriformis along with R LE nerve glides today and pt states pain at rest was abolished.  Instructed in piriformis stretch and R LE nerve glides for HEP.   PT Next Visit Plan hip/pelvic stability exercises; LE nerve glides; progress ankle strength/stability as able; manual and modalities PRN   Consulted and Agree with Plan of Care Patient        Problem List Patient Active Problem List   Diagnosis Date Noted  . Atypical chest pain 02/01/2013  . UTI (urinary tract infection) 11/03/2012  . LFT elevation 02/10/2012  . Back pain 06/19/2011  . Migraine with aura 06/19/2011  . Anxiety 02/23/2011  . Fever blister 11/25/2010  . HYPERCHOLESTEROLEMIA 06/14/2008  . NEPHROLITHIASIS 11/23/2007  . Interstitial cystitis 11/18/2007  . GROSS HEMATURIA 11/18/2007  . DIVERTICULOSIS OF COLON 10/26/2007  . NEOPLASM OF UNCERTAIN BEHAVIOR OF BLADDER 10/18/2007    HALL,RALPH PT, OCS 01/18/2015, 12:32 PM  [facility] [address]  [place], [place], [address] Phone: [phone]   Fax:  [phone]  Name: ILHAM ROUGHTON MRN: [id number] Date of Birth: 03-31-1970

## 2015-01-24 ENCOUNTER — Ambulatory Visit: Payer: 59 | Admitting: Physical Therapy

## 2015-01-24 DIAGNOSIS — M25571 Pain in right ankle and joints of right foot: Secondary | ICD-10-CM | POA: Diagnosis not present

## 2015-01-24 DIAGNOSIS — R262 Difficulty in walking, not elsewhere classified: Secondary | ICD-10-CM

## 2015-01-24 DIAGNOSIS — R269 Unspecified abnormalities of gait and mobility: Secondary | ICD-10-CM

## 2015-01-24 NOTE — Therapy (Signed)
[facility] [address]  [place], [place], [address] Phone: [phone]   Fax:  [phone]  Physical Therapy Treatment  Patient Details  Name: Hannah [name] MRN: [id number] Date of Birth: 1970-04-27 Referring Provider: Wylene Simmer  Encounter Date: 01/24/2015    Past Medical History  Diagnosis Date  . Hyperlipidemia   . Other chronic cystitis   . Diverticulosis of colon (without mention of hemorrhage)   . Gross hematuria   . Neoplasm of uncertain behavior of bladder   . Other symptoms involving urinary system(788.99)   . IC (interstitial cystitis)     per Dr. Jacqlyn Larsen, s/p botox injection  . Calculus of kidney     Past Surgical History  Procedure Laterality Date  . Abdominal hysterectomy  10/1999  . Cholecystectomy  10/23/2005    Lap  . Physical therapy  12/1997; 01/06/1998; 03/1998  . Laparoscopy abdomen diagnostic  07/1999    exploration adhesions to ovaries  . Ct abd w & pelvis wo cm  10/09    Thickening and irreg dome of bladder.  Diverticulosis  . Cystoscopy/bladder bx  11/03/07    Path...inflammation.  Hydrodistention Excoriations (Dr. Jacqlyn Larsen)   . Lumbar spine surgery    . Bilateral salpingoophorectomy    . Laparoscopic appendectomy  10/2013    [facility]    There were no vitals filed for this visit.  Visit Diagnosis:  Right ankle pain  Difficulty walking  Abnormality of gait      Subjective Assessment - 01/24/15 1115    Subjective States is having more pain lately through anterior aspect of lateral ankle but has improved some since last week rating 5/10 currently   Currently in Pain? Yes   Pain Score 5          TODAY'S TREATMENT Manual - R fibular head grade 2 AP mobs; STM and gentle DFM to R popliteal fossa; R LE nerve glides with ankle plantarflexed  [place] - 5cm, 3.3MHz, 0.5W/cm2, 8', 20% to R lateral popliteal fossa  Kinesiotape - 2 strips 50% stirrup and 50% Achilles but ran along  lateral gastroc towards fibular head today.              PT Short Term Goals - 12/07/14 1205    PT SHORT TERM GOAL #1   Title pt reports AVG pain decreases to 3/10 and worst pain to no greater than 6/10 by 10/15/14   Status Achieved           PT Long Term Goals - 01/04/15 1227    PT LONG TERM GOAL #1   Title pt reports R foot/ankle pain is intermittent in nature rating no greater than 3/10 at worst and AVG 1-2/10 or better by 02/01/15  pain-free 75-80% of the time but pain up to 7/10 recently   Status Partially Met   PT LONG TERM GOAL #2   Title R Ankle MMT 5/5 all planes without c/o pain with testing by 02/01/15  IV 4/5, PF and EV 4+/5, DF 5/5   Status Partially Met   PT LONG TERM GOAL #3   Title pt able to ambulate with good mechanics throughout the day without limitation by pain or need for ankle brace by 12/04/14  no need for brace; intermittent/infrequent pain but up to 7/10 when present with prolonged standing/walking   Status Partially Met               Plan - 01/24/15 1828  Clinical Impression Statement still with increased pain vs in the past but reports some improvement since last week.  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  Today even more convinced that her pain is nerve in orgin: was able to produce and increase foot pain with peforming SLR on R with plantarflexed ankle. Also able to produce pain to foot and along posterior thigh with mild palpation/pressure to common fibular nerve in politeal fossa.  Not certain where impingement is taking place due to known history of chronic LBP and basically with tenderness throughout R sciatic nerve distribution.  Will treat with nerve glides and STM along with exercise for now.   PT Next Visit Plan hip/pelvic stability exercises; LE nerve glides; progress ankle strength/stability as able; manual and modalities PRN   Consulted and Agree with Plan of Care Patient         Problem List Patient Active Problem List   Diagnosis Date Noted  . Atypical chest pain 02/01/2013  . UTI (urinary tract infection) 11/03/2012  . LFT elevation 02/10/2012  . Back pain 06/19/2011  . Migraine with aura 06/19/2011  . Anxiety 02/23/2011  . Fever blister 11/25/2010  . HYPERCHOLESTEROLEMIA 06/14/2008  . NEPHROLITHIASIS 11/23/2007  . Interstitial cystitis 11/18/2007  . GROSS HEMATURIA 11/18/2007  . DIVERTICULOSIS OF COLON 10/26/2007  . 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

## 2015-01-29 ENCOUNTER — Ambulatory Visit: Payer: 59 | Admitting: Physical Therapy

## 2015-02-01 ENCOUNTER — Ambulatory Visit: Payer: 59 | Admitting: Physical Therapy

## 2015-02-01 DIAGNOSIS — M25571 Pain in right ankle and joints of right foot: Secondary | ICD-10-CM

## 2015-02-01 DIAGNOSIS — R262 Difficulty in walking, not elsewhere classified: Secondary | ICD-10-CM

## 2015-02-01 DIAGNOSIS — R269 Unspecified abnormalities of gait and mobility: Secondary | ICD-10-CM

## 2015-02-01 NOTE — Therapy (Signed)
[facility] [address]  [place], [place], [address] Phone: [phone]   Fax:  [phone]  Physical Therapy Treatment  Patient Details  Name: Hannah [name] MRN: [id number] Date of Birth: October 24, 1970 Referring Provider: Wylene Simmer  Encounter Date: 02/01/2015      PT End of Session - 02/01/15 1110    Visit Number 30   Number of Visits 30   Date for PT Re-Evaluation 02/01/15   PT Start Time 1109   PT Stop Time 1144   PT Time Calculation (min) 35 min      Past Medical History  Diagnosis Date  . Hyperlipidemia   . Other chronic cystitis   . Diverticulosis of colon (without mention of hemorrhage)   . Gross hematuria   . Neoplasm of uncertain behavior of bladder   . Other symptoms involving urinary system(788.99)   . IC (interstitial cystitis)     per Dr. Jacqlyn Larsen, s/p botox injection  . Calculus of kidney     Past Surgical History  Procedure Laterality Date  . Abdominal hysterectomy  10/1999  . Cholecystectomy  10/23/2005    Lap  . Physical therapy  12/1997; 01/06/1998; 03/1998  . Laparoscopy abdomen diagnostic  07/1999    exploration adhesions to ovaries  . Ct abd w & pelvis wo cm  10/09    Thickening and irreg dome of bladder.  Diverticulosis  . Cystoscopy/bladder bx  11/03/07    Path...inflammation.  Hydrodistention Excoriations (Dr. Jacqlyn Larsen)   . Lumbar spine surgery    . Bilateral salpingoophorectomy    . Laparoscopic appendectomy  10/2013    [facility]    There were no vitals filed for this visit.  Visit Diagnosis:  Right ankle pain  Difficulty walking  Abnormality of gait      Subjective Assessment - 02/01/15 1111    Subjective states noted improvment for 2 days following last treatment regarding frequency of pain.  States then began to "creep back up" earlier this week.  Today pain extends through lateral lower leg to lateral foot/ankle.   Currently in Pain? Yes   Pain Score 4    Pain Location  Foot        TODAY'S TREATMENT TherEx - Stretch B HS, Piri Bridge 15x3"  Manual - R LE nerve glides with ankle DF and PF; R fibular head grade 2 AP mobs; STM and gentle DFM to R popliteal fossa  Kinesiotape - 2 strips 50% stirrup and 50% Achilles but ran along lateral gastroc towards fibular head today.                     PT Short Term Goals - 12/07/14 1205    PT SHORT TERM GOAL #1   Title pt reports AVG pain decreases to 3/10 and worst pain to no greater than 6/10 by 10/15/14   Status Achieved           PT Long Term Goals - 01/04/15 1227    PT LONG TERM GOAL #1   Title pt reports R foot/ankle pain is intermittent in nature rating no greater than 3/10 at worst and AVG 1-2/10 or better by 02/01/15  pain-free 75-80% of the time but pain up to 7/10 recently   Status Partially Met   PT LONG TERM GOAL #2   Title R Ankle MMT 5/5 all planes without c/o pain with testing by 02/01/15  IV 4/5, PF and EV 4+/5, DF 5/5  Status Partially Met   PT LONG TERM GOAL #3   Title pt able to ambulate with good mechanics throughout the day without limitation by pain or need for ankle brace by 12/04/14  no need for brace; intermittent/infrequent pain but up to 7/10 when present with prolonged standing/walking   Status Partially Met               Plan - 02/01/15 1148    Clinical Impression Statement Some improvement again today but still hasn't bounced back from increased pain following prolonged standing in the past.  Pain still seems nerve in origin and we are treating as such.  Reduced time for treatment today due to therapist running late and pt had to leave early.   PT Next Visit Plan hip/pelvic stability exercises; LE nerve glides; progress ankle strength/stability as able; manual and modalities PRN   Consulted and Agree with Plan of Care Patient        Problem List Patient Active Problem List   Diagnosis Date Noted  . Atypical chest pain 02/01/2013  . UTI  (urinary tract infection) 11/03/2012  . LFT elevation 02/10/2012  . Back pain 06/19/2011  . Migraine with aura 06/19/2011  . Anxiety 02/23/2011  . Fever blister 11/25/2010  . HYPERCHOLESTEROLEMIA 06/14/2008  . NEPHROLITHIASIS 11/23/2007  . Interstitial cystitis 11/18/2007  . GROSS HEMATURIA 11/18/2007  . DIVERTICULOSIS OF COLON 10/26/2007  . 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

## 2015-02-04 ENCOUNTER — Ambulatory Visit: Payer: 59 | Admitting: Physical Therapy

## 2015-02-04 DIAGNOSIS — R262 Difficulty in walking, not elsewhere classified: Secondary | ICD-10-CM

## 2015-02-04 DIAGNOSIS — M25571 Pain in right ankle and joints of right foot: Secondary | ICD-10-CM | POA: Diagnosis not present

## 2015-02-04 DIAGNOSIS — R269 Unspecified abnormalities of gait and mobility: Secondary | ICD-10-CM

## 2015-02-04 NOTE — Therapy (Signed)
[facility] [address]  [place], [place], [address] Phone: [phone]   Fax:  [phone]  Physical Therapy Treatment  Patient Details  Name: MYLAH BAYNES MRN: [id number] Date of Birth: Jan 14, 1970 Referring Provider: Wylene Simmer  Encounter Date: 02/04/2015      PT End of Session - 02/04/15 1142    Visit Number 31   Number of Visits 34   Date for PT Re-Evaluation 03/11/15          There were no vitals filed for this visit.  Visit Diagnosis:  Right ankle pain  Difficulty walking  Abnormality of gait      Subjective Assessment - 02/04/15 1142    Subjective "I think I'm doing really good".  States past 2 days she has felt good other than a dull ache.  Feels better than last week.   Pain Score 3    Pain Location Foot   Pain Orientation Right          TODAY'S TREATMENT TherEx - Bridge 10x3" Bridge with Black TB at knees 10x3" SL Bridge 5x each Stretch B HS, SKTC, Piri, R LE Nerve Glide with ankle DF and PF L Side-Lying R ankle EV AROM 15x (notes intermittent clicking during and increased ache after)  Kinesiotape as last treatment               PT Long Term Goals - 02/04/15 1140    PT LONG TERM GOAL #1   Title pt reports R foot/ankle pain is intermittent in nature rating no greater than 3/10 at worst and AVG 1-2/10 or better by 02/01/15  continued intermittent pain up to 7-8/10   Status Partially Met   PT LONG TERM GOAL #2   Title R Ankle MMT 5/5 all planes without c/o pain with testing by 02/01/15  5/5 DF, 4+/5 EV and IV, 4/5 PF - pain with testing   Status Partially Met   PT LONG TERM GOAL #3   Title pt able to ambulate with good mechanics throughout the day without limitation by pain or need for ankle brace by 12/04/14  no brace, intermittent limited by pain with prolonged standing/walking   Status Partially Met               Plan - 02/04/15 1139    Clinical  Impression Statement Some improvement again today but still hasn't bounced back from increased pain following prolonged standing around 01/18/15. Pain is lateral foot and ankle and able to increase/decrease pain by applying and decreasing neural tension in R LE (SLR with ankle ROM) which makes at least a component of her pain seem nerve in origin. She reports long history of lower back issues but I'm not certain if neural issue is that high. There may be some adhession/impingment in area of fibular head and we have been performing LE nerve glides as well as fibular head mobs latley. She is performing nerve glides and other exericses with HEP. At this point, she wants to continue with PT 1x every 1-2 wks to assist with symptom control. She is ambulating well without need for ankle brace and her ankle MMT has improved to 4+/5 into EV and IV, but still limited by pain to 4/5 in PF and notes pain with EV And IV as well. I have asked pt to f/u with MD but she feels she is making progress with PT and wants to continue.   Pt will benefit from skilled  therapeutic intervention in order to improve on the following deficits Pain;Decreased strength;Decreased balance   Rehab Potential Good   PT Frequency --  3 more treatments over the next 4-6wks for a total of up to 34 treatments   PT Duration --  4-6 wks   PT Treatment/Interventions Therapeutic exercise;Manual techniques;Taping;Therapeutic activities;Vasopneumatic Device;Dry needling;Balance training;Patient/family education;Functional mobility training;Gait training;Electrical Stimulation;Cryotherapy;Ultrasound   PT Next Visit Plan hip/pelvic stability exercises; LE nerve glides; progress ankle strength/stability as able; manual and modalities PRN   Consulted and Agree with Plan of Care Patient        Problem List   [facility] PT, OCS 02/07/2015, 11:42 AM  [facility] [address]  [place], [place], [address] Phone: [phone]   Fax:  [phone]  Name: ZAN TRISKA MRN: [id number] Date of Birth: August 10, 1970

## 2015-02-07 ENCOUNTER — Ambulatory Visit: Payer: 59 | Admitting: Physical Therapy

## 2015-02-22 ENCOUNTER — Ambulatory Visit: Payer: 59 | Attending: Orthopedic Surgery | Admitting: Physical Therapy

## 2015-02-22 DIAGNOSIS — M25571 Pain in right ankle and joints of right foot: Secondary | ICD-10-CM

## 2015-02-22 NOTE — Therapy (Addendum)
[facility] [address]  [place], [place], [address] Phone: [phone]   Fax:  [phone]  Physical Therapy Treatment  Patient Details  Name: Hannah [name] MRN: [id number] Date of Birth: [birth year] Referring Provider: Wylene Simmer  Encounter Date: 02/22/2015      PT End of Session - 02/22/15 1059    Visit Number 32   Number of Visits 34   Date for PT Re-Evaluation 03/11/15   PT Start Time 1058   PT Stop Time 1149   PT Time Calculation (min) 51 min      Past Medical History  Diagnosis Date  . Hyperlipidemia   . Other chronic cystitis   . Diverticulosis of colon (without mention of hemorrhage)   . Gross hematuria   . Neoplasm of uncertain behavior of bladder   . Other symptoms involving urinary system(788.99)   . IC (interstitial cystitis)     per Dr. Jacqlyn Larsen, s/p botox injection  . Calculus of kidney     Past Surgical History  Procedure Laterality Date  . Abdominal hysterectomy  10/1999  . Cholecystectomy  10/23/2005    Lap  . Physical therapy  12/1997; 01/06/1998; 03/1998  . Laparoscopy abdomen diagnostic  07/1999    exploration adhesions to ovaries  . Ct abd w & pelvis wo cm  10/09    Thickening and irreg dome of bladder.  Diverticulosis  . Cystoscopy/bladder bx  11/03/07    Path...inflammation.  Hydrodistention Excoriations (Dr. Jacqlyn Larsen)   . Lumbar spine surgery    . Bilateral salpingoophorectomy    . Laparoscopic appendectomy  10/2013    [facility]    There were no vitals filed for this visit.  Visit Diagnosis:  Right ankle pain      Subjective Assessment - 02/22/15 1059    Subjective States has been doing really well stating only noted pain 4-5 times in a 7-10 day period since last treatment.  A few days ago noted ache pain up entire R leg into hip/lower back and states this has persisted for past 2 days.  States foot and calf have also been achie past 2 days.   Currently in Pain? Yes   Pain  Score --  3-4/10 when present   Pain Location Foot   Pain Orientation Right   Multiple Pain Sites Yes   Pain Score 8   Pain Location Back   Pain Orientation Right   Pain Radiating Towards R lower back to R anterior thigh and posterior lower leg       TODAY'S TREATMENT Manual - R LE nerve glides  TherEx - Stretch R HS and Piriformis Bridge 10x3" Hooklying Hip ABD Blue TB 12x TRX DL Squat with Heel Raise 12x SLS with one arm low row Green TB 12x each Knee Flexion Machine 20# 15x DF Rocker Stretch 3x20"  Manual - R LE distraction, R talocrural distraction, STM peroneal tendons.  Kinesiotape - 2 strips 50% stirrup and 50% Achilles along lateral gastroc towards fibular head.         PT Education - 02/22/15 1157    Education provided Yes   Education Details updated HEP   Person(s) Educated Patient   Methods Explanation;Demonstration;Handout   Comprehension Returned demonstration;Verbalized understanding          PT Short Term Goals - 12/07/14 1205    PT SHORT TERM GOAL #1   Title pt reports AVG pain decreases to 3/10 and worst pain to no greater than  6/10 by 10/15/14   Status Achieved           PT Long Term Goals - 02/22/15 1159    PT LONG TERM GOAL #1   Title pt reports R foot/ankle pain is intermittent in nature rating no greater than 3/10 at worst and AVG 1-2/10 or better  intermittent, rating 3-4/10 when present lately   Status Partially Met   PT LONG TERM GOAL #2   Title R Ankle MMT 5/5 all planes without c/o pain with testing   Status On-going   PT LONG TERM GOAL #3   Title pt able to ambulate with good mechanics throughout the day without limitation by pain or need for ankle brace by 12/04/14   Status Achieved               Plan - 02/22/15 1149    Clinical Impression Statement Quite conclusive that ankle pain is radicular pain.  Last treatment included some lumbopelvic stability exercises and pt pain-free for 7-10 days other than 4-5 bouts  of pain in that period.  Then 2 days ago noted increasing R LBP which extended into R anterior thigh and posterior lower leg.  Pain continued and now extends into R lateral foot.  Able to increase this distribution of pain with SLR and ankle DF.  Therefore, today's treatment again included lumbopelvic stability along with manual nerve glides and pt reports her pain is basically gone by end of treatment (was 8/10 in thigh and 3-4/10 in foot at start of treatment).  Updated HEP today.   PT Next Visit Plan hip/pelvic stability exercises; LE nerve glides; progress ankle strength/stability as able; manual and modalities PRN   Consulted and Agree with Plan of Care Patient        Problem List Patient Active Problem List   Diagnosis Date Noted  . Atypical chest pain 02/01/2013  . UTI (urinary tract infection) 11/03/2012  . LFT elevation 02/10/2012  . Back pain 06/19/2011  . Migraine with aura 06/19/2011  . Anxiety 02/23/2011  . Fever blister 11/25/2010  . HYPERCHOLESTEROLEMIA 06/14/2008  . NEPHROLITHIASIS 11/23/2007  . Interstitial cystitis 11/18/2007  . GROSS HEMATURIA 11/18/2007  . DIVERTICULOSIS OF COLON 10/26/2007  . NEOPLASM OF UNCERTAIN BEHAVIOR OF BLADDER 10/18/2007    HALL,RALPH PT, OCS 02/22/2015, 12:00 PM  [facility] [address]  [place], [place], [address] Phone: [phone]   Fax:  [phone]  Name: Hannah [name] MRN: [id number] Date of Birth: [birth year]   PHYSICAL THERAPY DISCHARGE SUMMARY  Visits from Start of Care: 32  Current functional level related to goals / functional outcomes: Ms. Splinter progressed quite well with PT intervention overall often normal level of function. Pt last seen on 02/22/15 at which time concluded remaining pain was radicular in nature.  Updated HEP at that time.  Pt hasn't returned to PT since and we are therefore discharging her from our care at this time.   Remaining  deficits: Intermittent R LE pain   Education / Equipment: HEP, posture correction Plan: Patient agrees to discharge.  Patient goals were partially met. Patient is being discharged due to not returning since the last visit.  ?????        Leonette Most PT, OCS 05/01/2015 11:25 AM

## 2015-05-31 DIAGNOSIS — M6289 Other specified disorders of muscle: Secondary | ICD-10-CM | POA: Insufficient documentation

## 2015-08-25 DIAGNOSIS — Z79899 Other long term (current) drug therapy: Secondary | ICD-10-CM | POA: Insufficient documentation

## 2015-10-07 ENCOUNTER — Other Ambulatory Visit: Payer: Self-pay

## 2015-10-07 MED ORDER — ALBUTEROL SULFATE HFA 108 (90 BASE) MCG/ACT IN AERS: 1.0000 | INHALATION_SPRAY | RESPIRATORY_TRACT | 0 refills | Status: DC | PRN

## 2015-11-21 ENCOUNTER — Other Ambulatory Visit: Payer: Self-pay | Admitting: Family Medicine

## 2015-11-21 ENCOUNTER — Encounter: Payer: Self-pay | Admitting: Family Medicine

## 2015-11-21 ENCOUNTER — Ambulatory Visit (INDEPENDENT_AMBULATORY_CARE_PROVIDER_SITE_OTHER): Payer: Managed Care, Other (non HMO) | Admitting: Family Medicine

## 2015-11-21 VITALS — BP 110/76 | HR 74 | Temp 98.8°F | Wt 195.5 lb

## 2015-11-21 DIAGNOSIS — M549 Dorsalgia, unspecified: Secondary | ICD-10-CM

## 2015-11-21 DIAGNOSIS — Z23 Encounter for immunization: Secondary | ICD-10-CM

## 2015-11-21 DIAGNOSIS — M545 Low back pain: Secondary | ICD-10-CM

## 2015-11-21 DIAGNOSIS — Z1231 Encounter for screening mammogram for malignant neoplasm of breast: Secondary | ICD-10-CM

## 2015-11-21 DIAGNOSIS — G43109 Migraine with aura, not intractable, without status migrainosus: Secondary | ICD-10-CM

## 2015-11-21 DIAGNOSIS — N3010 Interstitial cystitis (chronic) without hematuria: Secondary | ICD-10-CM | POA: Diagnosis not present

## 2015-11-21 DIAGNOSIS — Z124 Encounter for screening for malignant neoplasm of cervix: Secondary | ICD-10-CM

## 2015-11-21 MED ORDER — LIDOCAINE 5 % EX PTCH: MEDICATED_PATCH | CUTANEOUS | 1 refills | Status: DC

## 2015-11-21 MED ORDER — ZOLMITRIPTAN 5 MG PO TABS: 2.5000 mg | ORAL_TABLET | ORAL | 1 refills | Status: DC | PRN

## 2015-11-21 MED ORDER — KETOROLAC TROMETHAMINE 30 MG/ML IJ SOLN
30.0000 mg | Freq: Once | INTRAMUSCULAR | Status: AC
  Administered 2015-11-21: 30 mg via INTRAMUSCULAR

## 2015-11-21 MED ORDER — CYCLOBENZAPRINE HCL 10 MG PO TABS: 10.0000 mg | ORAL_TABLET | Freq: Three times a day (TID) | ORAL | 1 refills | Status: DC | PRN

## 2015-11-21 NOTE — Patient Instructions (Addendum)
Schedule fasting labs when possible.   Rosaria Ferries will call about your referral.  See about going to the [place] at [facility].  They may be able to help you out.  Toradol shot today. Stop ibuprofen for now.   Continue flexeril.  Update [place] as needed.  Take care.  Glad to see you.

## 2015-11-21 NOTE — Progress Notes (Signed)
Back pain.  Already when to massage and chiropractor.  Pain started after helping push a disabled vehicle.  Taking flexeril and advil, but no advil in the last 48 hours.   Flexeril helps some.  She prev was injected in the past with toradol, in distant past with relief for another flare.  She had prev used lidoderm patches.  Radiating down then R leg.  No weakness.  No L sided sx.  No bruising, no rash.  Able to bear weight.    She used SABA prev when she had a cough.  Overall rare use, not needed currently.    Interstitial cystitis.  She was asking about getting routine labs done.  Ordered.    She was recently laid off from her old job, rehired elsewhere.    Her prev gyn MD retired.  She wanted referral re: new gyn clinic.  D/w pt.    Needed refill to have on hand for zomig, if needed.  Done at [facility].   Meds, vitals, and allergies reviewed.   ROS: Per HPI unless specifically indicated in ROS section   GEN: nad, alert and oriented NECK: supple w/o LA CV: rrr.  PULM: ctab, no inc wob ABD: soft, +bs EXT: no edema SKIN: no acute rash S/S wnl BLE but R SLR pos

## 2015-11-21 NOTE — Assessment & Plan Note (Signed)
Return for routine labs.

## 2015-11-21 NOTE — Progress Notes (Signed)
Pre visit review using our clinic review tool, if applicable. No additional management support is needed unless otherwise documented below in the visit note. 

## 2015-11-21 NOTE — Assessment & Plan Note (Signed)
Continue Zomig as needed.

## 2015-11-21 NOTE — Assessment & Plan Note (Addendum)
Likely sciatica flare. Continue Flexeril. Continue stretching. Toradol injection done today. Avoid other NSAIDs for now. Discussed with patient. She agrees. No weakness. Okay for outpatient follow-up. >25 minutes spent in face to face time with patient, >50% spent in counselling or coordination of care.

## 2015-11-26 ENCOUNTER — Ambulatory Visit: Payer: Self-pay | Admitting: Obstetrics and Gynecology

## 2015-12-05 ENCOUNTER — Telehealth: Payer: Self-pay | Admitting: Family Medicine

## 2015-12-05 NOTE — Telephone Encounter (Signed)
Pt called regarding ins ppw that was dropped off on Mon 11/27. It needs to be signed and mailed to [place]. Her ins is pending. Please call with any questions and when completed.

## 2015-12-06 NOTE — Telephone Encounter (Signed)
Hannah [name] found the ppw.  It does not require anything from the MD, it requests complete records for the past 3 years so it is in the stack to submit for records.

## 2015-12-06 NOTE — Telephone Encounter (Signed)
Please talk to me.  I don't recall ever seeing this paperwork.  I don't have it in my stack of papers pending for completion- I checked twice.

## 2015-12-06 NOTE — Telephone Encounter (Signed)
Thanks

## 2015-12-11 ENCOUNTER — Encounter: Payer: Self-pay | Admitting: Obstetrics and Gynecology

## 2015-12-11 ENCOUNTER — Ambulatory Visit (INDEPENDENT_AMBULATORY_CARE_PROVIDER_SITE_OTHER): Payer: Managed Care, Other (non HMO) | Admitting: Obstetrics and Gynecology

## 2015-12-11 VITALS — BP 110/70 | HR 76 | Resp 16 | Ht 67.0 in | Wt 196.0 lb

## 2015-12-11 DIAGNOSIS — Z01419 Encounter for gynecological examination (general) (routine) without abnormal findings: Secondary | ICD-10-CM | POA: Diagnosis not present

## 2015-12-11 DIAGNOSIS — N898 Other specified noninflammatory disorders of vagina: Secondary | ICD-10-CM | POA: Diagnosis not present

## 2015-12-11 DIAGNOSIS — Z23 Encounter for immunization: Secondary | ICD-10-CM

## 2015-12-11 DIAGNOSIS — L293 Anogenital pruritus, unspecified: Secondary | ICD-10-CM | POA: Diagnosis not present

## 2015-12-11 DIAGNOSIS — Z1272 Encounter for screening for malignant neoplasm of vagina: Secondary | ICD-10-CM | POA: Diagnosis not present

## 2015-12-11 MED ORDER — ESTRADIOL 0.05 MG/24HR TD PTTW: 1.0000 | MEDICATED_PATCH | TRANSDERMAL | 3 refills | Status: DC

## 2015-12-11 NOTE — Progress Notes (Signed)
45 y.o. G1P1001 MarriedCaucasianF here for annual exam.  She had a a laparoscopy for pain when her son was little, +endometriosis. She had a TAH/BSO when her son was 2 years. She was sad because she wanted more kids.  She is on a low dose Vivelle dot, doing well on it. Doesn't feel well if she goes off.  Sexually active. She has interstitial cystitis. She see's the urologist frequently. She has done pelvic floor PT, it helped. She has some dyspareunia related to the IC. She gets Botox injects, uses Elmiron.  She is currently on antibiotics for a UTI. About an hour ago she started having vaginal/vulvar itching, no d/c.  She reports a h/o fibrocystic breasts. Long term issues with breast pain intermittently. No change.     No LMP recorded. Patient has had a hysterectomy.          Sexually active: Yes.    The current method of family planning is status post hysterectomy.    Exercising: No.  The patient does not participate in regular exercise at present. Smoker:  no  Health Maintenance: Pap:  12/2014 WNL per patient  History of abnormal Pap:  no MMG:  11-28-14 WNL  Colonoscopy:  Never BMD:   Never TDaP: unsure   Gardasil: N/A   reports that she has never smoked. She has never used smokeless tobacco. She reports that she does not drink alcohol or use drugs. She used to work as a IT trainer, now works with Electronics engineer. Son is 31, senior in [employer]. Looking at colleges.  Past Medical History:  Diagnosis Date  . Calculus of kidney   . Chronic headaches   . Gross hematuria   . IC (interstitial cystitis)    per Dr. Jacqlyn Larsen, s/p botox injection  . Neoplasm of uncertain behavior of bladder   . Other chronic cystitis   . Other symptoms involving urinary system(788.99)     Past Surgical History:  Procedure Laterality Date  . ABDOMINAL HYSTERECTOMY  10/1999  . APPENDECTOMY    . BILATERAL SALPINGOOPHORECTOMY    . CESAREAN SECTION    . CHOLECYSTECTOMY  10/23/2005   Lap  . CT ABD W & PELVIS WO  CM  10/09   Thickening and irreg dome of bladder.  Diverticulosis  . Cystoscopy/bladder Bx  11/03/07   Path...inflammation.  Hydrodistention Excoriations (Dr. Jacqlyn Larsen)   . LAPAROSCOPIC APPENDECTOMY  10/2013   [facility]  . LAPAROSCOPY ABDOMEN DIAGNOSTIC  07/1999   exploration adhesions to ovaries  . LUMBAR SPINE SURGERY    . Physical Therapy  12/1997; 01/06/1998; 03/1998  Emergency C/S, arrest of descent (5 hours of pushing). Son was fine.   Current Outpatient Prescriptions  Medication Sig Dispense Refill  . albuterol (PROVENTIL HFA;VENTOLIN HFA) 108 (90 Base) MCG/ACT inhaler Inhale 1-2 puffs into the lungs every 4 (four) hours as needed. For shortness of breath or wheezing 18 g 0  . butalbital-aspirin-caffeine (FIORINAL) 50-325-40 MG per capsule Take 1 capsule by mouth 2 (two) times daily as needed for headache. 30 capsule 1  . cyclobenzaprine (FLEXERIL) 10 MG tablet Take 1 tablet (10 mg total) by mouth 3 (three) times daily as needed. For muscle spasms 100 tablet 1  . estradiol (VIVELLE-DOT) 0.05 MG/24HR patch Place 1 patch onto the skin 2 (two) times a week.    . lidocaine (LIDODERM) 5 % USE DAILY AS NEEDED FOR BACK PAIN.  APPLY 1 PATCH DAILY 12 HOURS ON 12 HOURS OFF 30 patch 1  . pentosan polysulfate (ELMIRON)  100 MG capsule Take 100 mg by mouth 2 (two) times daily.    . valACYclovir (VALTREX) 500 MG tablet Take 500 mg by mouth 3 (three) times daily as needed. For fever blisters    . zolmitriptan (ZOMIG) 5 MG tablet Take 0.5-1 tablets (2.5-5 mg total) by mouth as needed for migraine. Take 0.5 at onset of migraine, may repeat in 2 hours if not resolved.  Note:  Limit of 2 in 24 hours. 10 tablet 1   No current facility-administered medications for this visit.     Family History  Problem Relation Age of Onset  . Colon polyps Mother     mother with polyps before age 39  . Heart attack Mother   . Diabetes Father   . Diabetes Paternal Grandmother   . Cancer Neg Hx     Review of Systems   Constitutional: Negative.   HENT: Negative.   Eyes: Negative.   Respiratory: Negative.   Cardiovascular: Negative.   Gastrointestinal: Negative.   Endocrine: Negative.   Genitourinary: Negative.   Musculoskeletal: Negative.   Skin: Negative.   Allergic/Immunologic: Negative.   Neurological: Negative.   Psychiatric/Behavioral: Negative.     Exam:   BP 110/70 (BP Location: Right Arm, Patient Position: Sitting, Cuff Size: Normal)   Pulse 76   Resp 16   Ht 5\' 7"  (1.702 m)   Wt 196 lb (88.9 kg)   BMI 30.70 kg/m   Weight change: @WEIGHTCHANGE @ Height:   Height: 5\' 7"  (170.2 cm)  Ht Readings from Last 3 Encounters:  12/11/15 5\' 7"  (1.702 m)  06/19/14 5\' 7"  (1.702 m)  06/12/08 5\' 7"  (1.702 m)    General appearance: alert, cooperative and appears stated age Head: Normocephalic, without obvious abnormality, atraumatic Neck: no adenopathy, supple, symmetrical, trachea midline and thyroid normal to inspection and palpation Lungs: clear to auscultation bilaterally Breasts: normal appearance, no masses or tenderness Heart: regular rate and rhythm Abdomen: soft, non-tender; bowel sounds normal; no masses,  no organomegaly Extremities: extremities normal, atraumatic, no cyanosis or edema Skin: Skin color, texture, turgor normal. No rashes or lesions Lymph nodes: Cervical, supraclavicular, and axillary nodes normal. No abnormal inguinal nodes palpated Neurologic: Grossly normal   Pelvic: External genitalia:  no lesions              Urethra:  normal appearing urethra with no masses, tenderness or lesions              Bartholins and Skenes: normal                 Vagina: normal appearing vagina with normal color and a slight increase in thick white vaginal d/c              Cervix: absent               Bimanual Exam:  Uterus:  uterus absent              Adnexa: no mass, fullness, tenderness               Rectovaginal: Confirms               Anus:  normal sphincter tone, no  lesions  Chaperone was present for exam.  A:  Well Woman with normal exam  H/O TAH/BSO 16 years ago  H/O IC, see's urologist regularly  H/O hemorrhoids, occasionally needs suppositories  ERT, wants to continue, discussed the risks.    Genital pruritus, slight increase in vaginal d/c  P:   She has an order for her labs with Primary  Discussed breast self exam  Discussed calcium and vit D intake  Discussed guidelines for paps, she would like a pap with hpv  Mammogram scheduled

## 2015-12-11 NOTE — Patient Instructions (Signed)

## 2015-12-12 LAB — WET PREP BY MOLECULAR PROBE
Candida species: NEGATIVE
Gardnerella vaginalis: NEGATIVE
Trichomonas vaginosis: NEGATIVE

## 2015-12-17 LAB — IPS PAP TEST WITH HPV

## 2015-12-31 ENCOUNTER — Ambulatory Visit: Payer: PRIVATE HEALTH INSURANCE

## 2016-01-23 ENCOUNTER — Ambulatory Visit
Admission: RE | Admit: 2016-01-23 | Discharge: 2016-01-23 | Disposition: A | Discharge status: Home / Self Care | Payer: Managed Care, Other (non HMO) | Source: Ambulatory Visit | Attending: Family Medicine | Admitting: Family Medicine

## 2016-01-23 ENCOUNTER — Ambulatory Visit: Payer: PRIVATE HEALTH INSURANCE

## 2016-01-23 DIAGNOSIS — Z1231 Encounter for screening mammogram for malignant neoplasm of breast: Secondary | ICD-10-CM

## 2016-02-18 ENCOUNTER — Telehealth: Payer: Self-pay | Admitting: Family Medicine

## 2016-02-18 NOTE — Telephone Encounter (Signed)
Pt would like to know if she should get her thyroid tested since she has been chronically tired for the last 6 months.

## 2016-02-19 NOTE — Telephone Encounter (Signed)
She is due for routine follow-up labs anyway. I would offer the patient in early morning office visit with me so she can come in fasting. We can get her routine labs done at the visit. If she needs to get her thyroid checked because of the fatigue we can do that at the same time. It would be beneficial to talk to the patient first to make sure there was not any other things that we needed to do.  Thanks.

## 2016-02-19 NOTE — Telephone Encounter (Signed)
Patient notified as instructed by telephone and verbalized understanding. Appointment scheduled for Friday 02/21/16, lab appointment scheduled for that day at 7:30 cancelled because patient will be getting lab work after her office visit with Dr. Damita Dunnings.

## 2016-02-21 ENCOUNTER — Encounter: Payer: Self-pay | Admitting: Family Medicine

## 2016-02-21 ENCOUNTER — Other Ambulatory Visit: Payer: Managed Care, Other (non HMO)

## 2016-02-21 ENCOUNTER — Ambulatory Visit (INDEPENDENT_AMBULATORY_CARE_PROVIDER_SITE_OTHER): Payer: Managed Care, Other (non HMO) | Admitting: Family Medicine

## 2016-02-21 VITALS — BP 112/68 | HR 78 | Temp 98.9°F | Wt 193.0 lb

## 2016-02-21 DIAGNOSIS — R5383 Other fatigue: Secondary | ICD-10-CM

## 2016-02-21 DIAGNOSIS — Z8249 Family history of ischemic heart disease and other diseases of the circulatory system: Secondary | ICD-10-CM

## 2016-02-21 DIAGNOSIS — B001 Herpesviral vesicular dermatitis: Secondary | ICD-10-CM

## 2016-02-21 DIAGNOSIS — Z13220 Encounter for screening for lipoid disorders: Secondary | ICD-10-CM

## 2016-02-21 DIAGNOSIS — N3010 Interstitial cystitis (chronic) without hematuria: Secondary | ICD-10-CM | POA: Diagnosis not present

## 2016-02-21 LAB — LIPID PANEL
CHOLESTEROL: 176 mg/dL (ref 0–200)
HDL: 28.8 mg/dL — AB (ref >39.00)
LDL CALC: 123 mg/dL — AB (ref 0–99)
NonHDL: 146.82
Total CHOL/HDL Ratio: 6
Triglycerides: 119 mg/dL (ref 0.0–149.0)
VLDL: 23.8 mg/dL (ref 0.0–40.0)

## 2016-02-21 LAB — COMPREHENSIVE METABOLIC PANEL
ALK PHOS: 59 U/L (ref 39–117)
ALT: 27 U/L (ref 0–35)
AST: 21 U/L (ref 0–37)
Albumin: 4.2 g/dL (ref 3.5–5.2)
BUN: 11 mg/dL (ref 6–23)
CHLORIDE: 104 meq/L (ref 96–112)
CO2: 28 meq/L (ref 19–32)
Calcium: 9.2 mg/dL (ref 8.4–10.5)
Creatinine, Ser: 0.84 mg/dL (ref 0.40–1.20)
GFR: 77.15 mL/min (ref >60.00)
GLUCOSE: 88 mg/dL (ref 70–99)
POTASSIUM: 4.1 meq/L (ref 3.5–5.1)
Sodium: 138 mEq/L (ref 135–145)
Total Bilirubin: 0.4 mg/dL (ref 0.2–1.2)
Total Protein: 6.8 g/dL (ref 6.0–8.3)

## 2016-02-21 LAB — CBC WITH DIFFERENTIAL/PLATELET
Basophils Absolute: 0 10*3/uL (ref 0.0–0.1)
Basophils Relative: 0.5 % (ref 0.0–3.0)
EOS PCT: 3.1 % (ref 0.0–5.0)
Eosinophils Absolute: 0.2 10*3/uL (ref 0.0–0.7)
HCT: 43.7 % (ref 36.0–46.0)
Hemoglobin: 14.9 g/dL (ref 12.0–15.0)
LYMPHS ABS: 1.3 10*3/uL (ref 0.7–4.0)
Lymphocytes Relative: 25.2 % (ref 12.0–46.0)
MCHC: 34.1 g/dL (ref 30.0–36.0)
MCV: 88.1 fl (ref 78.0–100.0)
MONO ABS: 0.3 10*3/uL (ref 0.1–1.0)
MONOS PCT: 6.5 % (ref 3.0–12.0)
NEUTROS ABS: 3.4 10*3/uL (ref 1.4–7.7)
Neutrophils Relative %: 64.7 % (ref 43.0–77.0)
PLATELETS: 214 10*3/uL (ref 150.0–400.0)
RBC: 4.96 Mil/uL (ref 3.87–5.11)
RDW: 13.5 % (ref 11.5–15.5)
WBC: 5.3 10*3/uL (ref 4.0–10.5)

## 2016-02-21 LAB — TSH: TSH: 0.66 u[IU]/mL (ref 0.35–4.50)

## 2016-02-21 MED ORDER — VALACYCLOVIR HCL 500 MG PO TABS: 1000.0000 mg | ORAL_TABLET | Freq: Two times a day (BID) | ORAL | 1 refills | Status: DC | PRN

## 2016-02-21 NOTE — Addendum Note (Signed)
Addended by: Marchia Bond on: 02/21/2016 09:10 AM   Modules accepted: Orders

## 2016-02-21 NOTE — Progress Notes (Signed)
She is still on elmiron per uro and has f/u pending.  Due for follow up/ routine labs.    She had seen gynecology in the meantime, had her mammogram done.   Prev uri resolved w/o fever.  She had some diarrhea.  She is better but not back to baseline.  Still with some postnasal gtt.  Some fatigue over the last few months.  Unclear if from bladder sx disrupting her sleep or another cause.  She doesn't feel depressed.  S/p hysterectomy, no VB.  No blood in stool.  She has had prev blood in urine, likely due to IC and this has been worked up. No visible blood in urine recently.  No neck lumps, mass.  No neck pain.  She has some trouble with dysphagia occ, not consistently.  She is a fast eater at baseline, unclear how much that contributes.    FH CAD.  Due for lipid screening.    Needed valtrex for fever blisters. She can't tolerate more than 1000mg  at a time due to prev triggering headaches.  She can tolerate 1000mg  bid.    PMH and SH reviewed  ROS: Per HPI unless specifically indicated in ROS section   Meds, vitals, and allergies reviewed.   GEN: nad, alert and oriented HEENT: mucous membranes moist, tm wnl, nasal and OP exam wnl except for fever blister on the lower lip.   NECK: supple w/o LA, no tmg CV: rrr PULM: ctab, no inc wob ABD: soft, +bs EXT: no edema

## 2016-02-21 NOTE — Progress Notes (Signed)
Pre visit review using our clinic review tool, if applicable. No additional management support is needed unless otherwise documented below in the visit note. 

## 2016-02-21 NOTE — Assessment & Plan Note (Signed)
Check lipids today.  BP okay.  D/w pt.

## 2016-02-21 NOTE — Patient Instructions (Addendum)
Go to the lab on the way out.  We'll contact you with your lab report. Update me as needed.  Take care.  Glad to see you.  

## 2016-02-21 NOTE — Assessment & Plan Note (Signed)
Continue prn valtrex. 

## 2016-02-21 NOTE — Assessment & Plan Note (Signed)
Unclear source.  Doesn't feel depressed.  Check routine labs today.  Okay for outpatient f/u.  She agrees.

## 2016-02-21 NOTE — Assessment & Plan Note (Signed)
Per uro, with routine labs to be checked today.  See notes on labs.

## 2016-03-12 DIAGNOSIS — N3281 Overactive bladder: Secondary | ICD-10-CM | POA: Insufficient documentation

## 2016-06-10 IMAGING — CT CT ABD-PELV W/ CM
2 of 5 series · 16 of 46 positions shown, 18 images · IV contrast (isovue)
Comparison: 10/18/2007.

CLINICAL DATA: Right lower quadrant pain, leukocytosis, nausea/
vomiting/diarrhea. History of interstitial cystitis. Status post
cholecystectomy.

EXAM:
CT ABDOMEN AND PELVIS WITH CONTRAST
TECHNIQUE: Multidetector CT imaging of the abdomen and pelvis was performed
using the standard protocol following bolus administration of
intravenous contrast.
CONTRAST:  100 mL Isovue 300 IV

[Series 2: routine abd pel with · axial · 0.70mm/px · z∈[-446,-56]mm · 13 of 88 slices shown, 15 images]
[im 5/88  soft-tissue]
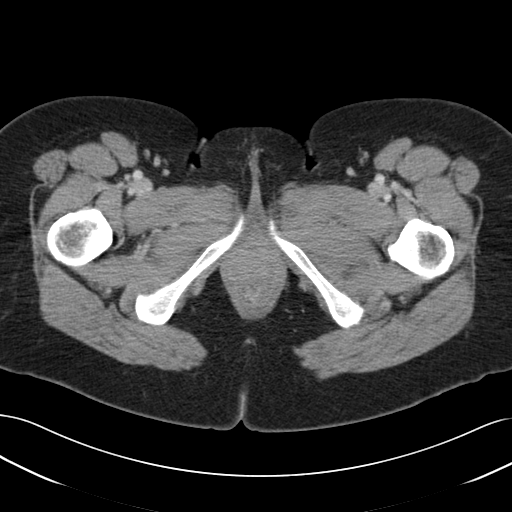
[im 5/88  bone]
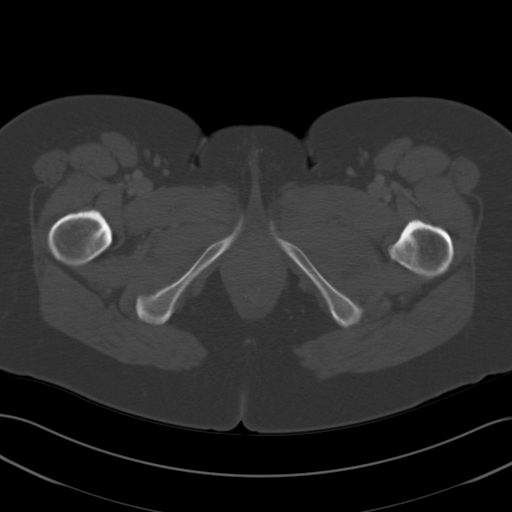
[im 14/88  soft-tissue]
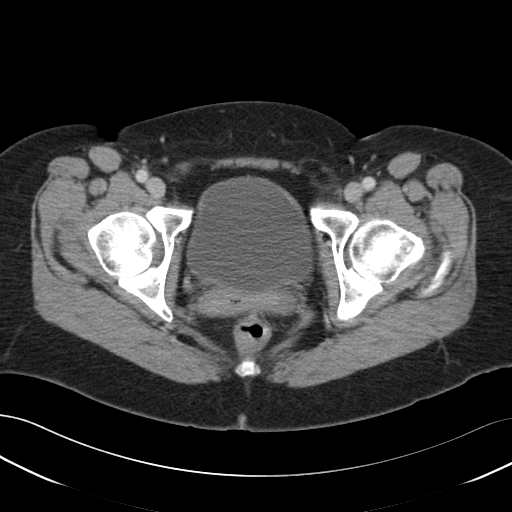
[im 19/88  soft-tissue]
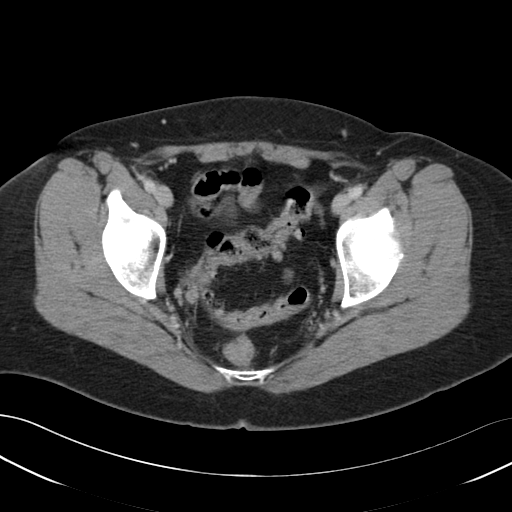
[im 23/88  soft-tissue]
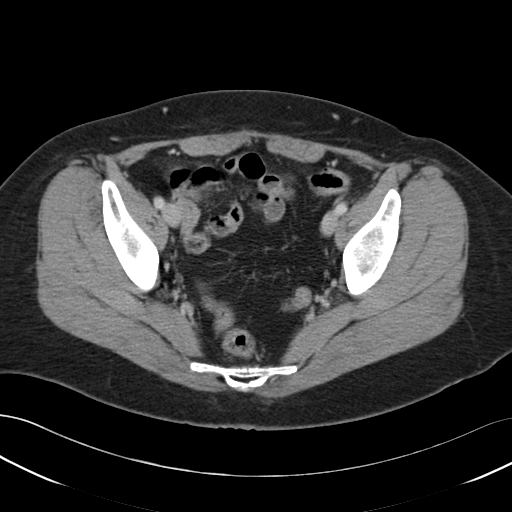
[im 33/88  soft-tissue]
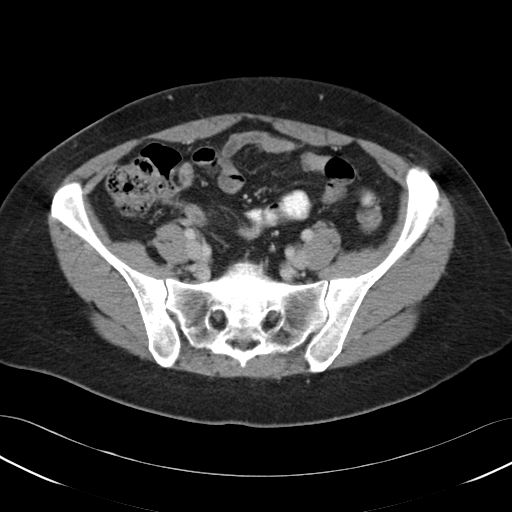
[im 37/88  soft-tissue]
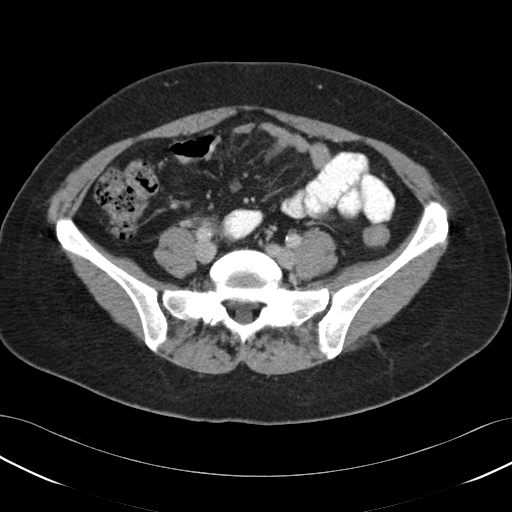
[im 46/88  soft-tissue]
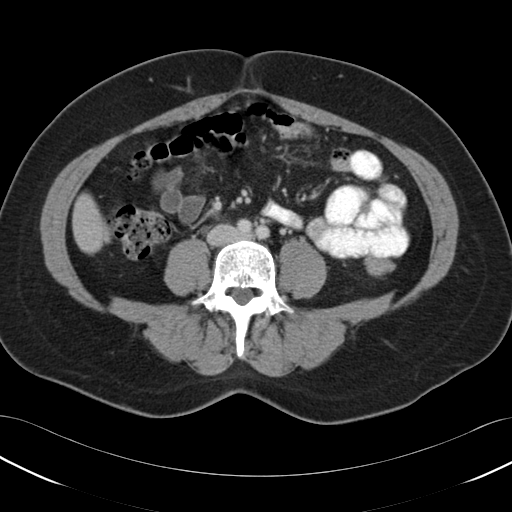
[im 51/88  soft-tissue]
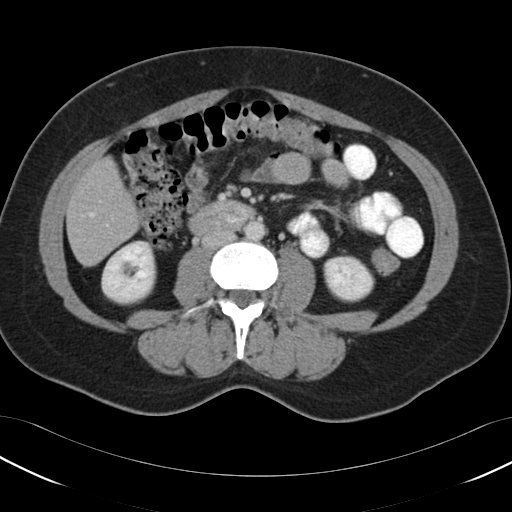
[im 55/88  soft-tissue]
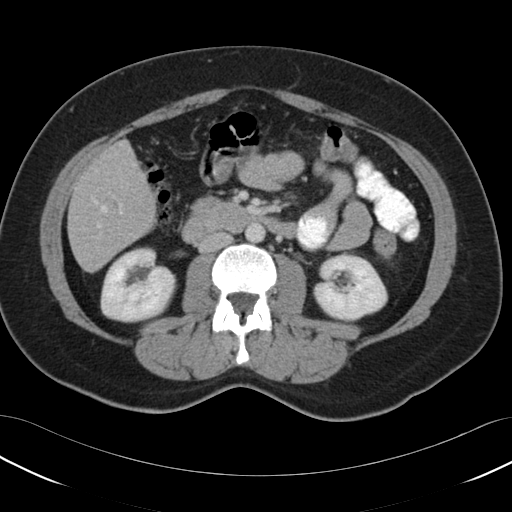
[im 55/88  bone]
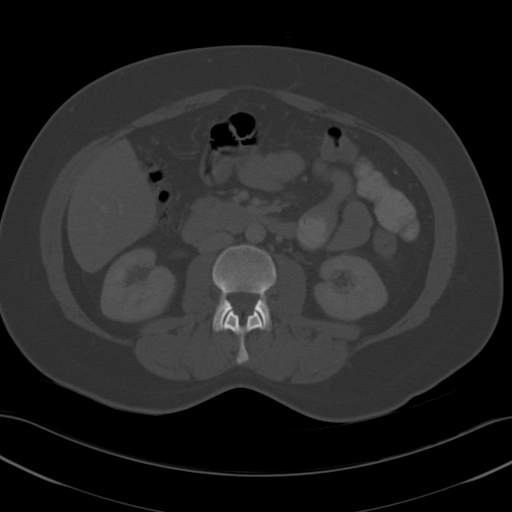
[im 65/88  soft-tissue]
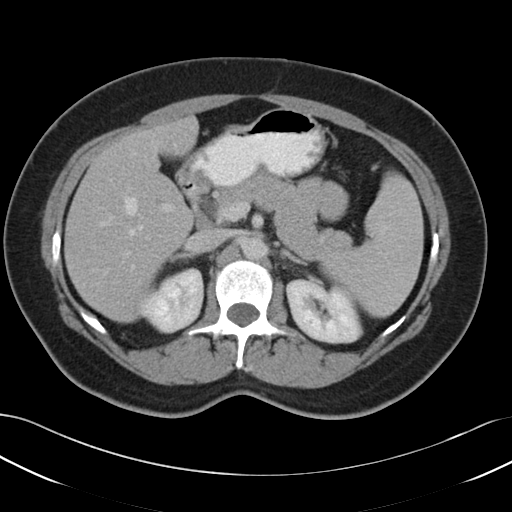
[im 69/88  soft-tissue]
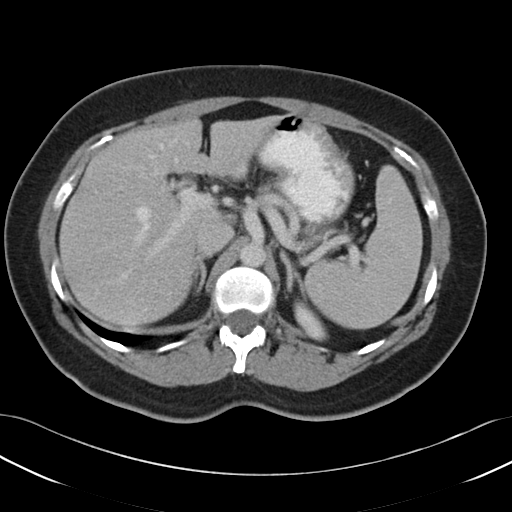
[im 74/88  soft-tissue]
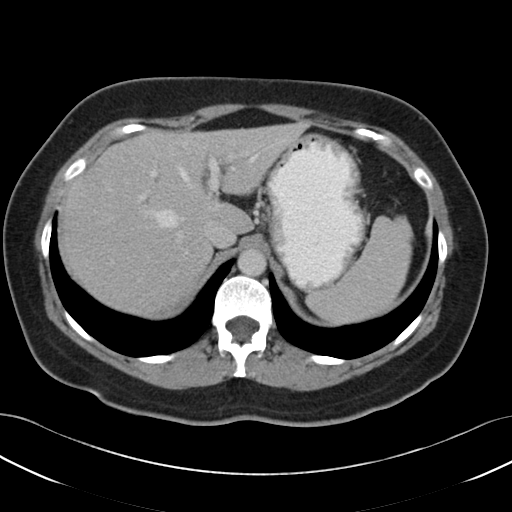
[im 83/88  soft-tissue]
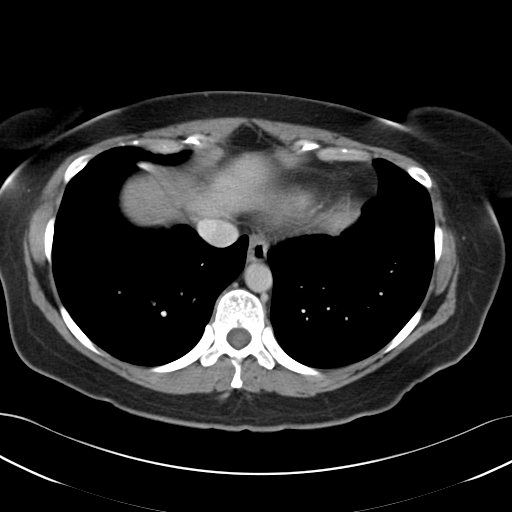

[Series 6: cor routine abd pel with · coronal · 0.70mm/px · 3 of 126 slices shown]
[im 42/126  soft-tissue]
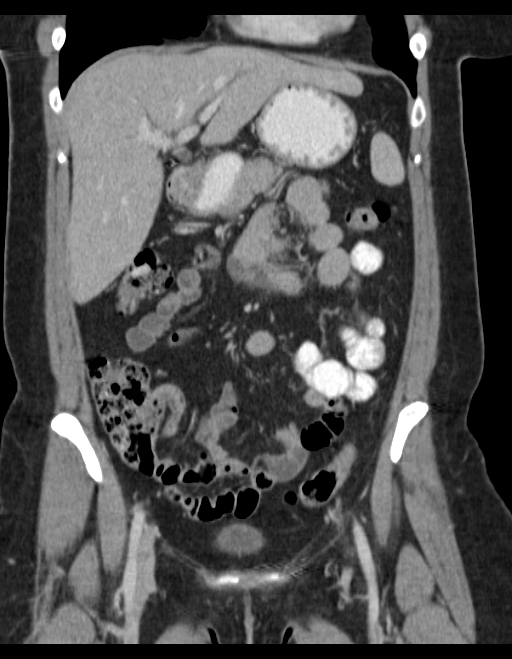
[im 56/126  soft-tissue]
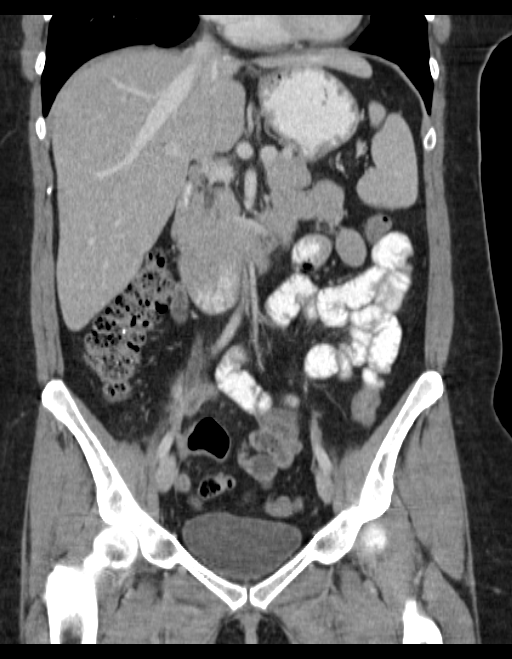
[im 70/126  soft-tissue]
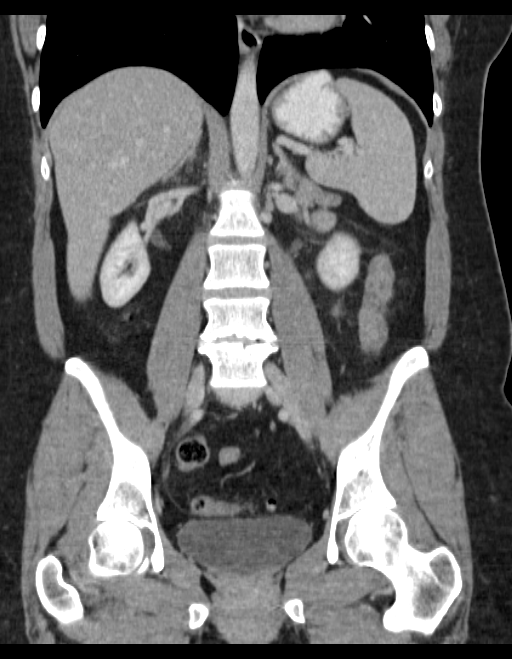

[16 of 46 positions shown; findings below may reference images not displayed]

FINDINGS: Lower chest:  Lung bases are clear.

Hepatobiliary: Liver is within normal limits.

Status post cholecystectomy. No intrahepatic or extrahepatic ductal
dilatation.

Pancreas: Within normal limits.

Spleen: Within normal limits.

Adrenals/Urinary Tract: Adrenal glands are unremarkable.

Kidneys are within normal limits.  No hydronephrosis.

Bladder is within normal limits

Stomach/Bowel: Stomach is unremarkable.

No evidence of bowel obstruction.

Abnormal, thick-walled appendix measuring up to 12 mm (series
2/image 54) with associated periappendiceal inflammatory changes,
reflecting acute appendicitis.

No drainable fluid collection/ abscess.  No free air.

Vascular/Lymphatic: No evidence of abdominal aortic aneurysm.

No suspicious abdominopelvic lymphadenopathy.

Reproductive: Status post hysterectomy.

No adnexal masses.

Other: No abdominopelvic ascites.

Musculoskeletal: Mild degenerative changes at L4-5.
IMPRESSION: Acute appendicitis.  No evidence of complication.

## 2016-06-17 ENCOUNTER — Other Ambulatory Visit: Payer: Self-pay

## 2016-06-17 NOTE — Telephone Encounter (Signed)
Medication refill request: estradiol patch 0.05mg  Last AEX:  12-11-15 Next AEX: 12-17-16 Last MMG (if hormonal medication request): 01-23-16 neg Refill authorized: rx was sent for 90 day supply with 3 refills at [facility]. Request denied. Pt has refills. I confirmed with pharmacy

## 2016-07-23 ENCOUNTER — Telehealth: Payer: Self-pay | Admitting: Family Medicine

## 2016-07-23 NOTE — Telephone Encounter (Signed)
Appointment scheduled.

## 2016-07-23 NOTE — Telephone Encounter (Signed)
Please see what info you can get on this.  Thanks.

## 2016-07-23 NOTE — Telephone Encounter (Signed)
Patient Name: Hannah [name]  DOB: 14-Sep-1970    Initial Comment caller states she has hemrhoid issues and she wants to speak with a nurse . She wants to know if office can do a procedure or will she have to have it done at hospital ? . 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 Will try call again in a few min.  attempted to return call and line is busy. Will close call as 3 attempts have been made

## 2016-07-24 ENCOUNTER — Encounter: Payer: Self-pay | Admitting: Family Medicine

## 2016-07-24 ENCOUNTER — Ambulatory Visit (INDEPENDENT_AMBULATORY_CARE_PROVIDER_SITE_OTHER): Payer: Managed Care, Other (non HMO) | Admitting: Family Medicine

## 2016-07-24 VITALS — BP 126/84 | HR 76 | Temp 98.5°F | Wt 190.2 lb

## 2016-07-24 DIAGNOSIS — K644 Residual hemorrhoidal skin tags: Secondary | ICD-10-CM

## 2016-07-24 DIAGNOSIS — Z1211 Encounter for screening for malignant neoplasm of colon: Secondary | ICD-10-CM

## 2016-07-24 MED ORDER — HYDROCORTISONE ACETATE 25 MG RE SUPP: 25.0000 mg | Freq: Two times a day (BID) | RECTAL | 0 refills | Status: DC | PRN

## 2016-07-24 MED ORDER — HYDROCORTISONE ACE-PRAMOXINE 1-1 % RE CREA: 1.0000 "application " | TOPICAL_CREAM | Freq: Two times a day (BID) | RECTAL | 1 refills | Status: DC | PRN

## 2016-07-24 NOTE — Patient Instructions (Addendum)
Hannah [name] will call about your referral. Keep using the cream externally.  If you have a flare of internal hemorrhoids then use the suppositories.  Limit straining.  Take care.  Glad to see you.

## 2016-07-24 NOTE — Progress Notes (Signed)
She has been on vacation and that didn't go well.  Her back and hip were sore after the vacation.  Then she had a ST and R ear pain, using cortisporin in the ear with some relief.  Then she had a likely hemorrhoid w/o constipation, more pain in the last few days.  She is frustrated about all of this.  D/w pt.  H/o hemorrhoids after giving birth prev.  Some dec in pain and the area is smaller.   We talked about colon cancer screening.  It would be reasonable to refer given her FH.    Meds, vitals, and allergies reviewed.   ROS: Per HPI unless specifically indicated in ROS section   nad ncat Anxious but she is able to gather herself emotionally. Chaperoned exam with no gross blood but soft external thrombosed hemorrhoid noted. Appears to be resolving.

## 2016-07-26 DIAGNOSIS — K644 Residual hemorrhoidal skin tags: Secondary | ICD-10-CM | POA: Insufficient documentation

## 2016-07-26 NOTE — Assessment & Plan Note (Signed)
Likely resolving, continue topical treatment. See after visit summary. Anatomy discussed with patient. She would not likely not benefit from thrombectomy at this point. Rationale discussed with patient. She understood. Refer to GI for colon cancer screening in general. Discussed with patient. She agrees.

## 2016-07-27 ENCOUNTER — Telehealth: Payer: Self-pay | Admitting: *Deleted

## 2016-07-27 NOTE — Telephone Encounter (Signed)
Left detailed message on voicemail to call if this has not been taken care of.

## 2016-07-27 NOTE — Telephone Encounter (Signed)
Fax received from pharmacy saying alternative is requested for Hydrocort-Pramoxine 1%-1% Cream Patient requests 2.5-1.

## 2016-07-27 NOTE — Telephone Encounter (Signed)
I thought this was handled over the weekend. Is this still an issue?  Thanks.

## 2016-07-27 NOTE — Telephone Encounter (Signed)
It looks like this was handled.  Thanks.

## 2016-07-27 NOTE — Telephone Encounter (Signed)
[facility] - Client >>>Contains Verbal Order - Signature Required<<< [facility] Patient Name: Hannah [name] Gender: Female DOB: April 22, 1970 Age: 46 Y 13 M 12 D Return Phone Number: [phone] (Primary) City/State/Zip: [place] [address] Client [facility] - Client Client Site [facility] - Day Who Is Calling Patient / Member / Family / Caregiver Call Type Triage / Clinical Relationship To Patient Self Return Phone Number [phone] (Primary) Chief Complaint Rectal Symptoms Reason for Call Symptomatic / Request for Health Information Initial Comment Caller was seen for chronic hemorrhoids and was told to keep using the medication she has been using and was supposed to have more called into the pharmacy but the pharmacy does not have the strength that was on the order. Caller needs the correct strength. Medication is hydrocortisone acetate 2.5 pramoxine. Appointment Disposition EMR Appointment Not Necessary Info pasted into Epic No Nurse Assessment Nurse: Tawanna Solo, RN, Vaughan Basta Date/Time (Eastern Time): 07/25/2016 9:34:41 AM Confirm and document reason for call. If symptomatic, describe symptoms. ---Caller needs hydrocortisone 2.5% with pramoxine HCL 1%. Caller says the pharmacy does not carry 1:1% that was prescribed. Pharmacy is [facility] in [place], phone [phone]. Guidelines Guideline Title Affirmed Question Disp. Time Eilene Ghazi Time) Disposition Final User 07/25/2016 10:33:28 AM Clinical Call Yes Tawanna Solo, RN, Vaughan Basta Verbal Orders/Maintenance Medications Medication Refill Route Dosage Regime Duration Admin Instructions User Name hydrocortisone 2.5% with pramoxine HCL 1% Per Rectum 1 application As Needed 1 application per rectum twice daily as needed for hemorrhoids Tawanna Solo, RN, Vaughan Basta Comments User: Prescilla Sours, RN Date/Time  Eilene Ghazi Time): 07/25/2016 10:33:13 AM Nurse informed caller that the prescription was being called to her pharmacy. PLEASE NOTE: All timestamps contained within this report are represented as [place] Standard Time. CONFIDENTIALTY NOTICE: This fax transmission is intended only for the addressee. It contains information that is legally privileged, confidential or otherwise protected from use or disclosure. If you are not the intended recipient, you are strictly prohibited from reviewing, disclosing, copying using or disseminating any of this information or taking any action in reliance on or regarding this information. If you have received this fax in error, please notify us immediately by telephone so that we can arrange for its return to [place]. Phone: [phone], Toll-Free: [phone], Fax: [phone] Page: 2 of 3 Call Id: [id number] [place] Phone DateTime Action Result/Outcome Notes Glori Bickers [place] - [place] [phone] 07/25/2016 9:43:43 AM Doctor Paged Paged On Call Back to Call Center Call Tor Netters RN with [facility] at [phone] Loura Pardon - MD [phone] 07/25/2016 9:56:36 AM Doctor Paged Paged On Call Back to Call Center Please call Tor Netters. Rn with [facility] at [phone]. Loura Pardon - [place] [phone] 07/25/2016 10:08:22 AM Doctor Paged Called On Call Provider - Left Message [place], [place] - MD 07/25/2016 10:26:20 AM Message Result Spoke with On Call - General Caller needs hydrocortisone 2.5% with pramoxine HCL 1%. Caller says the pharmacy does not carry 1:1% that was prescribed. Ok to change to Hydrocortisone 2.5% with Pramoxine HCL 1% with same directions.

## 2016-10-08 ENCOUNTER — Other Ambulatory Visit: Payer: Self-pay | Admitting: *Deleted

## 2016-10-08 MED ORDER — ESTRADIOL 0.05 MG/24HR TD PTTW: 1.0000 | MEDICATED_PATCH | TRANSDERMAL | 0 refills | Status: DC

## 2016-10-08 NOTE — Telephone Encounter (Signed)
Medication refill request: Estradiol  Last AEX:  12-11-15  Next AEX: 12-17-16  Last MMG (if hormonal medication request): 01-23-16 WNL  Refill authorized: please advise

## 2016-10-16 ENCOUNTER — Ambulatory Visit: Payer: Managed Care, Other (non HMO) | Admitting: Family Medicine

## 2016-10-16 ENCOUNTER — Encounter: Payer: Self-pay | Admitting: Family Medicine

## 2016-10-16 ENCOUNTER — Ambulatory Visit (INDEPENDENT_AMBULATORY_CARE_PROVIDER_SITE_OTHER): Payer: Managed Care, Other (non HMO) | Admitting: Family Medicine

## 2016-10-16 VITALS — BP 104/66 | HR 82 | Temp 98.3°F | Wt 194.2 lb

## 2016-10-16 DIAGNOSIS — R300 Dysuria: Secondary | ICD-10-CM | POA: Diagnosis not present

## 2016-10-16 LAB — POC URINALSYSI DIPSTICK (AUTOMATED)
Bilirubin, UA: NEGATIVE
Glucose, UA: NEGATIVE
Ketones, UA: NEGATIVE
NITRITE UA: NEGATIVE
PROTEIN UA: NEGATIVE
RBC UA: NEGATIVE
SPEC GRAV UA: 1.015 (ref 1.010–1.025)
UROBILINOGEN UA: 0.2 U/dL
pH, UA: 6 (ref 5.0–8.0)

## 2016-10-16 MED ORDER — ALBUTEROL SULFATE HFA 108 (90 BASE) MCG/ACT IN AERS: 1.0000 | INHALATION_SPRAY | RESPIRATORY_TRACT | 1 refills | Status: DC | PRN

## 2016-10-16 MED ORDER — CIPROFLOXACIN HCL 250 MG PO TABS: 250.0000 mg | ORAL_TABLET | Freq: Two times a day (BID) | ORAL | 0 refills | Status: DC

## 2016-10-16 NOTE — Progress Notes (Signed)
She hasn't felt well in the last week.  She has f/u with urology pending but wasn't able to get in quickly (her urologist was out of clinic due to a family emergency).    Dysuria: yes.  Also with episodic HA, lower abd pain.  duration of symptoms: last week.  abdominal pain:yes fevers:no back pain:lower back pain.  Vomiting:no She is on elmiron at baseline.  She has IC, d/w pt.   She has had a few migraines but usually doesn't have severe sx.    Meds, vitals, and allergies reviewed.   Per HPI unless specifically indicated in ROS section   GEN: nad, alert and oriented HEENT: mucous membranes moist NECK: supple CV: rrr.  PULM: ctab, no inc wob ABD: soft, +bs, suprapubic area tender EXT: no edema SKIN: no acute rash

## 2016-10-16 NOTE — Patient Instructions (Signed)
Drink plenty of water and start the antibiotics today.  We'll contact you with your lab report.  Take care.  Glad to see you.  

## 2016-10-18 DIAGNOSIS — R300 Dysuria: Secondary | ICD-10-CM | POA: Insufficient documentation

## 2016-10-18 NOTE — Assessment & Plan Note (Signed)
Presumed infectious cystitis. Check urine culture. Start Cipro. This is not typical for her baseline interstitial cystitis symptoms. She agrees okay for outpatient follow-up. Nontoxic.Marland Kitchen

## 2016-10-19 LAB — URINE CULTURE
MICRO NUMBER:: 81140316
SPECIMEN QUALITY:: ADEQUATE

## 2016-10-20 ENCOUNTER — Other Ambulatory Visit: Payer: Self-pay | Admitting: Family Medicine

## 2016-10-20 MED ORDER — CIPROFLOXACIN HCL 250 MG PO TABS: 250.0000 mg | ORAL_TABLET | Freq: Two times a day (BID) | ORAL | 0 refills | Status: DC

## 2016-12-03 ENCOUNTER — Telehealth: Payer: Self-pay
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
## 2016-12-03 NOTE — Telephone Encounter (Signed)
Copy this note to patients chart Hannah [name] [phone] and sent to dr Damita Dunnings

## 2016-12-04 ENCOUNTER — Telehealth: Payer: Self-pay | Admitting: *Deleted

## 2016-12-04 NOTE — Telephone Encounter (Signed)
Copied from [place] [id number]. Topic: Complaint - Staff >> Dec 04, 2016  8:35 AM Aurelio Brash B wrote: Date of Incident: 11/29 Details of complaint: issues with front desk and other issues in past - would like to discuss How would the patient like to see it resolved? undecided On a scale of 1-10, how was your experience?   4 What would it take to bring it to a 10? unknown Route to Engineer, building services.

## 2016-12-17 ENCOUNTER — Ambulatory Visit: Payer: Managed Care, Other (non HMO) | Admitting: Obstetrics and Gynecology

## 2016-12-21 ENCOUNTER — Other Ambulatory Visit: Payer: Self-pay | Admitting: Obstetrics and Gynecology

## 2016-12-25 ENCOUNTER — Emergency Department: Payer: Managed Care, Other (non HMO)

## 2016-12-25 ENCOUNTER — Emergency Department
Admission: EM | Admit: 2016-12-25 | Discharge: 2016-12-25 | Disposition: A | Discharge status: Home / Self Care | Payer: Managed Care, Other (non HMO) | Attending: Emergency Medicine | Admitting: Emergency Medicine

## 2016-12-25 DIAGNOSIS — Y999 Unspecified external cause status: Secondary | ICD-10-CM | POA: Insufficient documentation

## 2016-12-25 DIAGNOSIS — R51 Headache: Secondary | ICD-10-CM | POA: Diagnosis present

## 2016-12-25 DIAGNOSIS — Y9389 Activity, other specified: Secondary | ICD-10-CM | POA: Diagnosis not present

## 2016-12-25 DIAGNOSIS — S060X0A Concussion without loss of consciousness, initial encounter: Secondary | ICD-10-CM

## 2016-12-25 DIAGNOSIS — Z79899 Other long term (current) drug therapy: Secondary | ICD-10-CM | POA: Insufficient documentation

## 2016-12-25 DIAGNOSIS — Z8551 Personal history of malignant neoplasm of bladder: Secondary | ICD-10-CM | POA: Diagnosis not present

## 2016-12-25 DIAGNOSIS — S161XXA Strain of muscle, fascia and tendon at neck level, initial encounter: Secondary | ICD-10-CM | POA: Diagnosis not present

## 2016-12-25 DIAGNOSIS — Y92410 Unspecified street and highway as the place of occurrence of the external cause: Secondary | ICD-10-CM | POA: Insufficient documentation

## 2016-12-25 DIAGNOSIS — M7918 Myalgia, other site: Secondary | ICD-10-CM | POA: Insufficient documentation

## 2016-12-25 MED ORDER — ONDANSETRON HCL 4 MG/2ML IJ SOLN
INTRAMUSCULAR | Status: AC
  Administered 2016-12-25: 4 mg via INTRAVENOUS
  Filled 2016-12-25: qty 2

## 2016-12-25 MED ORDER — IBUPROFEN 600 MG PO TABS: 600.0000 mg | ORAL_TABLET | Freq: Four times a day (QID) | ORAL | 0 refills | Status: DC | PRN

## 2016-12-25 MED ORDER — KETOROLAC TROMETHAMINE 30 MG/ML IJ SOLN
30.0000 mg | Freq: Once | INTRAMUSCULAR | Status: AC
  Administered 2016-12-25: 30 mg via INTRAVENOUS
  Filled 2016-12-25: qty 1

## 2016-12-25 MED ORDER — ONDANSETRON HCL 4 MG PO TABS: 4.0000 mg | ORAL_TABLET | Freq: Three times a day (TID) | ORAL | 0 refills | Status: AC | PRN

## 2016-12-25 MED ORDER — ONDANSETRON HCL 4 MG/2ML IJ SOLN
4.0000 mg | Freq: Once | INTRAMUSCULAR | Status: AC
  Administered 2016-12-25: 4 mg via INTRAVENOUS

## 2016-12-25 MED ORDER — OXYCODONE-ACETAMINOPHEN 5-325 MG PO TABS: 1.0000 | ORAL_TABLET | Freq: Four times a day (QID) | ORAL | 0 refills | Status: AC | PRN

## 2016-12-25 MED ORDER — CYCLOBENZAPRINE HCL 10 MG PO TABS: 10.0000 mg | ORAL_TABLET | Freq: Three times a day (TID) | ORAL | 0 refills | Status: AC | PRN

## 2016-12-25 NOTE — ED Notes (Signed)
Pt able to ambulate to BR without difficulty, able to turn from side to side, denies numbness or loss of bladder func. Pt states hx of back surgery "20 years ago."
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
## 2016-12-25 NOTE — ED Notes (Signed)
Patient transported to X-ray 

## 2016-12-25 NOTE — Discharge Instructions (Signed)
Follow-up with your regular doctor within 1-2 weeks.  Return to the emergency department for new or worsening pain, weakness or numbness, worsening headache, nausea or vomiting, vision changes, confusion, or any other new or worsening symptoms that concern you.  You should take the ibuprofen primarily, and can take the Flexeril for the next several days.  Take the Percocet only for more severe pain when needed, and try to use it as seldom as possible and discontinue as early as possible.

## 2016-12-25 NOTE — ED Notes (Signed)
Pt eating sandwich and chips without difficulty.

## 2016-12-25 NOTE — ED Notes (Signed)

## 2017-01-01 ENCOUNTER — Encounter: Payer: Self-pay | Admitting: Family Medicine

## 2017-01-01 ENCOUNTER — Ambulatory Visit: Payer: Managed Care, Other (non HMO) | Admitting: Family Medicine

## 2017-01-01 VITALS — BP 118/70 | HR 81 | Temp 98.6°F | Wt 196.2 lb

## 2017-01-01 DIAGNOSIS — S060X0A Concussion without loss of consciousness, initial encounter: Secondary | ICD-10-CM

## 2017-01-01 MED ORDER — VALACYCLOVIR HCL 500 MG PO TABS: 1000.0000 mg | ORAL_TABLET | Freq: Two times a day (BID) | ORAL | 1 refills | Status: DC | PRN

## 2017-01-01 MED ORDER — BUTALBITAL-ASPIRIN-CAFFEINE 50-325-40 MG PO CAPS: 1.0000 | ORAL_CAPSULE | Freq: Two times a day (BID) | ORAL | 1 refills | Status: DC | PRN
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
## 2017-01-01 NOTE — Patient Instructions (Addendum)
Hannah [name] will call about your referral. Brain rest in the meantime for the concussion.  Shut it down and take it easy.   Avoid movies, TV, computers.   Update me as needed.   Take care.  Glad to see you.

## 2017-01-03 DIAGNOSIS — Z87820 Personal history of traumatic brain injury: Secondary | ICD-10-CM | POA: Insufficient documentation

## 2017-01-03 NOTE — Assessment & Plan Note (Signed)
See above

## 2017-01-03 NOTE — Assessment & Plan Note (Signed)
Discussed with patient about concussion pathophysiology.  She needs "brain rest" at this point.  See after visit summary.  Avoid triggers/stimulating environments.  At this point no focal neurologic deficits (she does not appear to have neurologic source for weakness in the left leg).  Okay for outpatient follow-up.  CT head negative for acute changes.  No acute findings on other imaging.  Discussed with patient.  Reasonable to follow-up with the chiropractic clinic.  Okay to refer to PT.  Massage is reasonable for muscle tightness.  I would expect her symptoms to gradually improve recovery timeline from concussion can be variable from one patient to another. >25 minutes spent in face to face time with patient, >50% spent in counselling or coordination of care.

## 2017-01-08 ENCOUNTER — Other Ambulatory Visit: Payer: Self-pay | Admitting: *Deleted

## 2017-01-08 NOTE — Telephone Encounter (Signed)
Faxed refill request. Valcyclovir Last office visit:   01/01/2017 Last Filled:    20 tablet 1 01/01/2017  Patient requests 60 tablets Please advise.

## 2017-01-10 MED ORDER — VALACYCLOVIR HCL 500 MG PO TABS: 1000.0000 mg | ORAL_TABLET | Freq: Two times a day (BID) | ORAL | 0 refills | Status: DC | PRN

## 2017-01-10 NOTE — Telephone Encounter (Signed)
Sent. Thanks.   

## 2017-01-11 ENCOUNTER — Other Ambulatory Visit: Payer: Self-pay | Admitting: *Deleted

## 2017-03-05 ENCOUNTER — Other Ambulatory Visit: Payer: Self-pay | Admitting: Orthopedic Surgery

## 2017-03-05 DIAGNOSIS — M50122 Cervical disc disorder at C5-C6 level with radiculopathy: Secondary | ICD-10-CM

## 2017-03-05 DIAGNOSIS — M5116 Intervertebral disc disorders with radiculopathy, lumbar region: Secondary | ICD-10-CM

## 2017-03-15 ENCOUNTER — Other Ambulatory Visit: Payer: Self-pay | Admitting: Family Medicine

## 2017-03-15 NOTE — Telephone Encounter (Signed)
Electronic refill request. Lidoderm Patch Last office visit:   01/01/17 Last Filled:    30 patch 1 11/21/2015  Please advise.

## 2017-03-16 NOTE — Telephone Encounter (Signed)
Sent. Thanks.   

## 2017-03-31 ENCOUNTER — Ambulatory Visit: Payer: Managed Care, Other (non HMO)

## 2017-03-31 ENCOUNTER — Ambulatory Visit
Admission: RE | Admit: 2017-03-31 | Discharge: 2017-03-31 | Disposition: A | Discharge status: Home / Self Care | Payer: Managed Care, Other (non HMO) | Source: Ambulatory Visit | Attending: Orthopedic Surgery | Admitting: Orthopedic Surgery

## 2017-03-31 DIAGNOSIS — M5126 Other intervertebral disc displacement, lumbar region: Secondary | ICD-10-CM | POA: Diagnosis present

## 2017-03-31 DIAGNOSIS — M50223 Other cervical disc displacement at C6-C7 level: Secondary | ICD-10-CM | POA: Diagnosis not present

## 2017-03-31 DIAGNOSIS — M48061 Spinal stenosis, lumbar region without neurogenic claudication: Secondary | ICD-10-CM | POA: Insufficient documentation

## 2017-03-31 DIAGNOSIS — M961 Postlaminectomy syndrome, not elsewhere classified: Secondary | ICD-10-CM | POA: Diagnosis not present

## 2017-03-31 DIAGNOSIS — M4302 Spondylolysis, cervical region: Secondary | ICD-10-CM | POA: Diagnosis not present

## 2017-03-31 DIAGNOSIS — M5116 Intervertebral disc disorders with radiculopathy, lumbar region: Secondary | ICD-10-CM

## 2017-03-31 DIAGNOSIS — M5136 Other intervertebral disc degeneration, lumbar region: Secondary | ICD-10-CM | POA: Diagnosis not present

## 2017-03-31 DIAGNOSIS — M4802 Spinal stenosis, cervical region: Secondary | ICD-10-CM | POA: Diagnosis not present

## 2017-03-31 DIAGNOSIS — M50122 Cervical disc disorder at C5-C6 level with radiculopathy: Secondary | ICD-10-CM | POA: Diagnosis present

## 2017-05-27 ENCOUNTER — Other Ambulatory Visit: Payer: Self-pay

## 2017-05-27 ENCOUNTER — Other Ambulatory Visit (HOSPITAL_COMMUNITY)
Admission: RE | Admit: 2017-05-27 | Discharge: 2017-05-27 | Disposition: A | Discharge status: Home / Self Care | Payer: Managed Care, Other (non HMO) | Source: Ambulatory Visit | Attending: Obstetrics & Gynecology | Admitting: Obstetrics & Gynecology

## 2017-05-27 ENCOUNTER — Other Ambulatory Visit: Payer: Self-pay | Admitting: Certified Nurse Midwife

## 2017-05-27 ENCOUNTER — Encounter

## 2017-05-27 ENCOUNTER — Ambulatory Visit: Payer: Managed Care, Other (non HMO) | Admitting: Certified Nurse Midwife

## 2017-05-27 ENCOUNTER — Encounter: Payer: Self-pay | Admitting: Certified Nurse Midwife

## 2017-05-27 VITALS — BP 120/78 | HR 70 | Resp 16 | Ht 66.75 in | Wt 192.0 lb

## 2017-05-27 DIAGNOSIS — Z124 Encounter for screening for malignant neoplasm of cervix: Secondary | ICD-10-CM | POA: Diagnosis not present

## 2017-05-27 DIAGNOSIS — Z8669 Personal history of other diseases of the nervous system and sense organs: Secondary | ICD-10-CM

## 2017-05-27 DIAGNOSIS — N951 Menopausal and female climacteric states: Secondary | ICD-10-CM

## 2017-05-27 DIAGNOSIS — N3010 Interstitial cystitis (chronic) without hematuria: Secondary | ICD-10-CM | POA: Diagnosis not present

## 2017-05-27 DIAGNOSIS — Z1231 Encounter for screening mammogram for malignant neoplasm of breast: Secondary | ICD-10-CM

## 2017-05-27 DIAGNOSIS — Z01419 Encounter for gynecological examination (general) (routine) without abnormal findings: Secondary | ICD-10-CM | POA: Diagnosis not present

## 2017-05-27 DIAGNOSIS — Z79890 Hormone replacement therapy (postmenopausal): Secondary | ICD-10-CM

## 2017-05-27 MED ORDER — ESTRADIOL 0.05 MG/24HR TD PTTW: 1.0000 | MEDICATED_PATCH | TRANSDERMAL | 0 refills | Status: DC

## 2017-05-27 NOTE — Patient Instructions (Signed)
EXERCISE AND DIET:  We recommended that you start or continue a regular exercise program for good health. Regular exercise means any activity that makes your heart beat faster and makes you sweat.  We recommend exercising at least 30 minutes per day at least 3 days a week, preferably 4 or 5.  We also recommend a diet low in fat and sugar.  Inactivity, poor dietary choices and obesity can cause diabetes, heart attack, stroke, and kidney damage, among others.    ALCOHOL AND SMOKING:  Women should limit their alcohol intake to no more than 7 drinks/beers/glasses of wine (combined, not each!) per week. Moderation of alcohol intake to this level decreases your risk of breast cancer and liver damage. And of course, no recreational drugs are part of a healthy lifestyle.  And absolutely no smoking or even second hand smoke. Most people know smoking can cause heart and lung diseases, but did you know it also contributes to weakening of your bones? Aging of your skin?  Yellowing of your teeth and nails?  CALCIUM AND VITAMIN D:  Adequate intake of calcium and Vitamin D are recommended.  The recommendations for exact amounts of these supplements seem to change often, but generally speaking 600 mg of calcium (either carbonate or citrate) and 800 units of Vitamin D per day seems prudent. Certain women may benefit from higher intake of Vitamin D.  If you are among these women, your doctor will have told you during your visit.    PAP SMEARS:  Pap smears, to check for cervical cancer or precancers,  have traditionally been done yearly, although recent scientific advances have shown that most women can have pap smears less often.  However, every woman still should have a physical exam from her gynecologist every year. It will include a breast check, inspection of the vulva and vagina to check for abnormal growths or skin changes, a visual exam of the cervix, and then an exam to evaluate the size and shape of the uterus and  ovaries.  And after 49 years of age, a rectal exam is indicated to check for rectal cancers. We will also provide age appropriate advice regarding health maintenance, like when you should have certain vaccines, screening for sexually transmitted diseases, bone density testing, colonoscopy, mammograms, etc.   MAMMOGRAMS:  All women over 49 years old should have a yearly mammogram. Many facilities now offer a "3D" mammogram, which may cost around $50 extra out of pocket. If possible,  we recommend you accept the option to have the 3D mammogram performed.  It both reduces the number of women who will be called back for extra views which then turn out to be normal, and it is better than the routine mammogram at detecting truly abnormal areas.    COLONOSCOPY:  Colonoscopy to screen for colon cancer is recommended for all women at age 49.  We know, you hate the idea of the prep.  We agree, BUT, having colon cancer and not knowing it is worse!!  Colon cancer so often starts as a polyp that can be seen and removed at colonscopy, which can quite literally save your life!  And if your first colonoscopy is normal and you have no family history of colon cancer, most women don't have to have it again for 10 years.  Once every ten years, you can do something that may end up saving your life, right?  We will be happy to help you get it scheduled when you are ready.    Be sure to check your insurance coverage so you understand how much it will cost.  It may be covered as a preventative service at no cost, but you should check your particular policy.      Menopause and Hormone Replacement Therapy What is hormone replacement therapy? Hormone replacement therapy (HRT) is the use of artificial (synthetic) hormones to replace hormones that your body stops producing during menopause. Menopause is the normal time of life when menstrual periods stop completely and the ovaries stop producing the female hormones estrogen and  progesterone. This lack of hormones can affect your health and cause undesirable symptoms. HRT can relieve some of those symptoms. What are my options for HRT? HRT may consist of the synthetic hormones estrogen and progestin, or it may consist of only estrogen (estrogen-only therapy). You and your health care provider will decide which form of HRT is best for you. If you choose to be on HRT and you have a uterus, estrogen and progestin are usually prescribed. Estrogen-only therapy is used for women who do not have a uterus. Possible options for taking HRT include:  Pills.  Patches.  Gels.  Sprays.  Vaginal cream.  Vaginal rings.  Vaginal inserts.  The amount of hormone(s) that you take and how long you take the hormone(s) varies depending on your individual health. It is important to:  Begin HRT with the lowest possible dosage.  Stop HRT as soon as your health care provider tells you to stop.  Work with your health care provider so that you feel informed and comfortable with your decisions.  What are the benefits of HRT? HRT can reduce the frequency and severity of menopausal symptoms. Benefits of HRT vary depending on the menopausal symptoms that you have, the severity of your symptoms, and your overall health. HRT may help to improve the following menopausal symptoms:  Hot flashes and night sweats. These are sudden feelings of heat that spread over the face and body. The skin may turn red, like a blush. Night sweats are hot flashes that happen while you are sleeping or trying to sleep.  Bone loss (osteoporosis). The body loses calcium more quickly after menopause, causing the bones to become weaker. This can increase the risk for bone breaks (fractures).  Vaginal dryness. The lining of the vagina can become thin and dry, which can cause pain during sexual intercourse or cause infection, burning, or itching.  Urinary tract infections.  Urinary incontinence. This is a  decreased ability to control when you urinate.  Irritability.  Short-term memory problems.  What are the risks of HRT? Risks of HRT vary depending on your individual health and medical history. Risks of HRT also depend on whether you receive both estrogen and progestin or you receive estrogen only.HRT may increase the risk of:  Spotting. This is when a small amount of bloodleaks from the vagina unexpectedly.  Endometrial cancer. This cancer is in the lining of the uterus (endometrium).  Breast cancer.  Increased density of breast tissue. This can make it harder to find breast cancer on a breast X-ray (mammogram).  Stroke.  Heart attack.  Blood clots.  Gallbladder disease.  Risks of HRT can increase if you have any of the following conditions:  Endometrial cancer.  Liver disease.  Heart disease.  Breast cancer.  History of blood clots.  History of stroke.  How should I care for myself while I am on HRT?  Take over-the-counter and prescription medicines only as told by your health care  provider.  Get mammograms, pelvic exams, and medical checkups as often as told by your health care provider.  Have Pap tests done as often as told by your health care provider. A Pap test is sometimes called a Pap smear. It is a screening test that is used to check for signs of cancer of the cervix and vagina. A Pap test can also identify the presence of infection or precancerous changes. Pap tests may be done: ? Every 3 years, starting at age 23. ? Every 5 years, starting after age 69, in combination with testing for human papillomavirus (HPV). ? More often or less often depending on other medical conditions you have, your age, and other risk factors.  It is your responsibility to get your Pap test results. Ask your health care provider or the department performing the test when your results will be ready.  Keep all follow-up visits as told by your health care provider. This is  important. When should I seek medical care? Talk with your health care provider if:  You have any of these: ? Pain or swelling in your legs. ? Shortness of breath. ? Chest pain. ? Lumps or changes in your breasts or armpits. ? Slurred speech. ? Pain, burning, or bleeding when you urine.  You develop any of these: ? Unusual vaginal bleeding. ? Dizziness or headaches. ? Weakness or numbness in any part of your arms or legs. ? Pain in your abdomen.  This information is not intended to replace advice given to you by your health care provider. Make sure you discuss any questions you have with your health care provider. Document Released: 09/20/2002 Document Revised: 11/19/2015 Document Reviewed: 06/25/2014 Elsevier Interactive Patient Education  2017 [place] to meet you today Debbi
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
## 2017-05-28 LAB — CYTOLOGY - PAP: DIAGNOSIS: NEGATIVE

## 2017-06-18 ENCOUNTER — Ambulatory Visit
Admission: RE | Admit: 2017-06-18 | Discharge: 2017-06-18 | Disposition: A | Discharge status: Home / Self Care | Payer: Managed Care, Other (non HMO) | Source: Ambulatory Visit | Attending: Certified Nurse Midwife | Admitting: Certified Nurse Midwife

## 2017-06-18 DIAGNOSIS — Z1231 Encounter for screening mammogram for malignant neoplasm of breast: Secondary | ICD-10-CM

## 2017-06-25 ENCOUNTER — Telehealth: Payer: Managed Care, Other (non HMO) | Admitting: Family Medicine

## 2017-06-25 DIAGNOSIS — H60339 Swimmer's ear, unspecified ear: Secondary | ICD-10-CM

## 2017-06-25 MED ORDER — CIPROFLOXACIN-HYDROCORTISONE 0.2-1 % OT SUSP: 3.0000 [drp] | Freq: Two times a day (BID) | OTIC | 0 refills | Status: AC

## 2017-06-25 NOTE — Progress Notes (Signed)
E Visit for Swimmer's Ear  We are sorry that you are not feeling well. Here is how we plan to help!  I have prescribed: Ciprofloxin 0.2% and hydrocortisone 1% otic suspension 3 drops in affected ears twice daily until completed  If symptoms not improved in 48 hours or less please seek face to face care.Thanks.  In certain cases swimmer's ear may progress to a more serious bacterial infection of the middle or inner ear.  If you have a fever 102 and up and significantly worsening symptoms, this could indicate a more serious infection moving to the middle/inner and needs face to face evaluation in an office by a provider.  Your symptoms should improve over the next 3 days and should resolve in about 7 days.  HOME CARE:   Wash your hands frequently.  Do not place the tip of the bottle on your ear or touch it with your fingers.  You can take Acetominophen 650 mg every 4-6 hours as needed for pain.  If pain is severe or moderate, you can apply a heating pad (set on low) or hot water bottle (wrapped in a towel) to outer ear for 20 minutes.  This will also increase drainage.  Avoid ear plugs  Do not use Q-tips  After showers, help the water run out by tilting your head to one side.  GET HELP RIGHT AWAY IF:   Fever is over 102.2 degrees.  You develop progressive ear pain or hearing loss.  Ear symptoms persist longer than 3 days after treatment.  MAKE SURE YOU:   Understand these instructions.  Will watch your condition.  Will get help right away if you are not doing well or get worse.  TO PREVENT SWIMMER'S EAR:  Use a bathing cap or custom fitted swim molds to keep your ears dry.  Towel off after swimming to dry your ears.  Tilt your head or pull your earlobes to allow the water to escape your ear canal.  If there is still water in your ears, consider using a hairdryer on the lowest setting.  Thank you for choosing an e-visit. Your e-visit answers were reviewed by a  board certified advanced clinical practitioner to complete your personal care plan. Depending upon the condition, your plan could have included both over the counter or prescription medications. Please review your pharmacy choice. Be sure that the pharmacy you have chosen is open so that you can pick up your prescription now.  If there is a problem you may message your provider in [place] to have the prescription routed to another pharmacy. Your safety is important to [place]. If you have drug allergies check your prescription carefully.  For the next 24 hours, you can use MyChart to ask questions about today's visit, request a non-urgent call back, or ask for a work or school excuse from your e-visit provider. You will get an email in the next two days asking about your experience. I hope that your e-visit has been valuable and will speed your recovery.

## 2017-08-18 ENCOUNTER — Other Ambulatory Visit: Payer: Self-pay | Admitting: Certified Nurse Midwife

## 2017-08-18 DIAGNOSIS — N951 Menopausal and female climacteric states: Secondary | ICD-10-CM

## 2017-08-18 DIAGNOSIS — Z79890 Hormone replacement therapy (postmenopausal): Principal | ICD-10-CM

## 2017-08-18 NOTE — Telephone Encounter (Signed)
Medication refill request: estradiol 0.05 mg 24 hour patch  Last AEX:  05/27/17 Next AEX: 06/02/18 Last MMG (if hormonal medication request): 06/18/17  Bi-rads category 1 neg  Refill authorized: Please refill if appropriate.

## 2017-08-24 ENCOUNTER — Ambulatory Visit: Payer: Managed Care, Other (non HMO) | Admitting: Family Medicine

## 2017-08-24 ENCOUNTER — Encounter: Payer: Self-pay | Admitting: Family Medicine

## 2017-08-24 VITALS — BP 110/80 | HR 86 | Temp 98.7°F | Ht 66.75 in | Wt 191.5 lb

## 2017-08-24 DIAGNOSIS — S060X0A Concussion without loss of consciousness, initial encounter: Secondary | ICD-10-CM | POA: Diagnosis not present

## 2017-08-24 DIAGNOSIS — N3010 Interstitial cystitis (chronic) without hematuria: Secondary | ICD-10-CM | POA: Diagnosis not present

## 2017-08-24 LAB — POC URINALSYSI DIPSTICK (AUTOMATED)
Bilirubin, UA: NEGATIVE
Blood, UA: NEGATIVE
Glucose, UA: NEGATIVE
Ketones, UA: NEGATIVE
LEUKOCYTES UA: NEGATIVE
Nitrite, UA: NEGATIVE
PROTEIN UA: NEGATIVE
SPEC GRAV UA: 1.01 (ref 1.010–1.025)
UROBILINOGEN UA: 0.2 U/dL
pH, UA: 6 (ref 5.0–8.0)

## 2017-08-24 LAB — CBC WITH DIFFERENTIAL/PLATELET
BASOS PCT: 0.5 % (ref 0.0–3.0)
Basophils Absolute: 0 10*3/uL (ref 0.0–0.1)
EOS PCT: 2.4 % (ref 0.0–5.0)
Eosinophils Absolute: 0.1 10*3/uL (ref 0.0–0.7)
HEMATOCRIT: 44.5 % (ref 36.0–46.0)
HEMOGLOBIN: 14.9 g/dL (ref 12.0–15.0)
LYMPHS PCT: 22.7 % (ref 12.0–46.0)
Lymphs Abs: 1.3 10*3/uL (ref 0.7–4.0)
MCHC: 33.5 g/dL (ref 30.0–36.0)
MCV: 90.3 fl (ref 78.0–100.0)
Monocytes Absolute: 0.4 10*3/uL (ref 0.1–1.0)
Monocytes Relative: 6.6 % (ref 3.0–12.0)
NEUTROS ABS: 3.8 10*3/uL (ref 1.4–7.7)
Neutrophils Relative %: 67.8 % (ref 43.0–77.0)
PLATELETS: 185 10*3/uL (ref 150.0–400.0)
RBC: 4.93 Mil/uL (ref 3.87–5.11)
RDW: 13.9 % (ref 11.5–15.5)
WBC: 5.5 10*3/uL (ref 4.0–10.5)

## 2017-08-24 LAB — COMPREHENSIVE METABOLIC PANEL
ALBUMIN: 4.6 g/dL (ref 3.5–5.2)
ALT: 27 U/L (ref 0–35)
AST: 18 U/L (ref 0–37)
Alkaline Phosphatase: 65 U/L (ref 39–117)
BUN: 13 mg/dL (ref 6–23)
CALCIUM: 9.8 mg/dL (ref 8.4–10.5)
CHLORIDE: 104 meq/L (ref 96–112)
CO2: 29 meq/L (ref 19–32)
Creatinine, Ser: 0.89 mg/dL (ref 0.40–1.20)
GFR: 71.72 mL/min (ref >60.00)
Glucose, Bld: 101 mg/dL — ABNORMAL HIGH (ref 70–99)
POTASSIUM: 4.2 meq/L (ref 3.5–5.1)
Sodium: 139 mEq/L (ref 135–145)
Total Bilirubin: 0.5 mg/dL (ref 0.2–1.2)
Total Protein: 7.4 g/dL (ref 6.0–8.3)

## 2017-08-24 MED ORDER — ALPRAZOLAM 0.5 MG PO TABS: 0.5000 mg | ORAL_TABLET | Freq: Two times a day (BID) | ORAL | 1 refills | Status: DC | PRN

## 2017-08-24 MED ORDER — PENTOSAN POLYSULFATE SODIUM 100 MG PO CAPS: 100.0000 mg | ORAL_CAPSULE | Freq: Three times a day (TID) | ORAL | 1 refills | Status: DC | PRN
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
## 2017-08-24 NOTE — Patient Instructions (Addendum)
Use the xanax if needed.   Let me know if you need refills.   Take care.  Glad to see you.  Update me as needed.   We'll culture your urine.  Go to the lab on the way out.  We'll contact you with your lab report.

## 2017-08-25 LAB — URINE CULTURE
MICRO NUMBER: 90990806
SPECIMEN QUALITY: ADEQUATE

## 2017-08-25 NOTE — Assessment & Plan Note (Signed)
Her concussion sx are better in the meantime, but still has some lapses of concentration, processing speed, headaches.  Continue supportive care with routine cautions.

## 2017-08-25 NOTE — Assessment & Plan Note (Addendum)
Abd pain/bladder sx.  Urology MD Jacqlyn Larsen) moved and she was asking about options.  She had sx in her 57s, and she had w/u done at that point.  She was on macrobid daily at that point.  She had bladder biopsy, instillations, cystoscopy in the past.  She is still on elmiron, now on 1 tab TID prn with prev LFT elevation on higher doses.  She has more sx and nocturia 5-6x/night, trouble getting through a meeting, etc.  She is working on diet, etc.  She had prev bladder botox injections.  She uses macrobid after sex.  She has tried using cranberry juice.  She went to pelvic floor PT prev.  She had used BZD prn at baseline for anxiety and IC.  She is really anxious about the situation with Cope moving out of state.  No illicits.    She may still go see Dr. Jacqlyn Larsen out of state re: botox injections.    I am willing to write for elmiron and benzodiazepine.  No abuse of either medication.  Recheck routine labs today.  Update me as needed.  She agrees. >25 minutes spent in face to face time with patient, >50% spent in counselling or coordination of care.

## 2017-10-01 ENCOUNTER — Encounter: Payer: Self-pay | Admitting: Family Medicine

## 2017-10-01 ENCOUNTER — Ambulatory Visit: Payer: Managed Care, Other (non HMO) | Admitting: Family Medicine

## 2017-10-01 VITALS — BP 102/68 | HR 81 | Temp 98.3°F | Ht 66.75 in | Wt 192.8 lb

## 2017-10-01 DIAGNOSIS — R202 Paresthesia of skin: Secondary | ICD-10-CM

## 2017-10-01 DIAGNOSIS — Z23 Encounter for immunization: Secondary | ICD-10-CM | POA: Diagnosis not present

## 2017-10-01 MED ORDER — LIDOCAINE 5 % EX PTCH: MEDICATED_PATCH | CUTANEOUS | 1 refills | Status: DC

## 2017-10-01 MED ORDER — CYCLOBENZAPRINE HCL 10 MG PO TABS: 5.0000 mg | ORAL_TABLET | Freq: Three times a day (TID) | ORAL | 1 refills | Status: DC | PRN
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
## 2017-10-01 NOTE — Patient Instructions (Addendum)
We will call about your referral.  Rosaria Ferries or Azalee Course will call you if you don't see one of them on the way out.  Use flexeril and ibuprofen if needed.  GI caution on the ibuprofen.   Use the lidoderm patches if needed.   Gently stretch.   Take care.  Glad to see you.

## 2017-10-03 DIAGNOSIS — R202 Paresthesia of skin: Secondary | ICD-10-CM | POA: Insufficient documentation
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
## 2017-10-25 ENCOUNTER — Telehealth: Payer: Self-pay | Admitting: *Deleted

## 2017-10-25 NOTE — Telephone Encounter (Signed)
Patient says that her husband has left his job to be self-employed and they need to [employer] on their own.  Patient says her insurance will not accept her because of the Elmiron on her meds list.  Patient says she seldom even uses it and has probably enough on hand right now to last a year.  Patient asks if you could either write a note saying that she has been taken off the medication or that she seldom uses it.  Please do not insinuate that it is for IC.  Patient would like to pick up the note on Wednesday if at all possible.

## 2017-10-26 NOTE — Telephone Encounter (Signed)
Letter placed at front desk for pickup by patient on Wednesday, October 23rd.

## 2017-10-26 NOTE — Telephone Encounter (Signed)
Letter done for patient.  If she seldom uses the medication, I can write a letter stating that.  Thanks.

## 2017-11-03 ENCOUNTER — Telehealth: Payer: Self-pay

## 2017-11-03 NOTE — Telephone Encounter (Signed)
Copied from [place] [phone]. Topic: Appointment Scheduling - Scheduling Inquiry for Clinic >> Nov 02, 2017  4:59 PM Margot Ables wrote: Reason for CRM: pt called stating that she is having migraines as well as needing to talk to Dr. Damita Dunnings about Elmiron. Pt states she spoke with Lugene about some issues already. She is asking if there is any way possible for her to come in this week as she is needing to fly out on Monday. Please call to advise.  Can we put patient in in one of the same day spots? 15 minutes ok?

## 2017-11-03 NOTE — Telephone Encounter (Signed)
Spoke with patient. Patient is scheduled for tomorrow 11/04/2017 at 1 pm. Patient is going to come in at 12:45 pm for check in. Spoke with Leafy Ro, coverage will be provided as of right now at least. Thank you. And patient said thank Edrick Kins, RMA

## 2017-11-03 NOTE — Telephone Encounter (Signed)
Called patient and her husband, left message to call me back to schedule.-Anastasiya V Hopkins, RMA

## 2017-11-03 NOTE — Telephone Encounter (Signed)
Thanks

## 2017-11-03 NOTE — Telephone Encounter (Signed)
If there is any lunch coverage tomorrow, then 1pm would be good for me.   Even if there isn't lunchtime coverage and I need to work up her and room her, that is fine too.  Just let me know what I need to do.  Thanks.

## 2017-11-04 ENCOUNTER — Ambulatory Visit: Payer: Self-pay | Admitting: Family Medicine

## 2017-11-04 ENCOUNTER — Encounter: Payer: Self-pay | Admitting: Family Medicine

## 2017-11-04 DIAGNOSIS — N3010 Interstitial cystitis (chronic) without hematuria: Secondary | ICD-10-CM

## 2017-11-04 DIAGNOSIS — G43109 Migraine with aura, not intractable, without status migrainosus: Secondary | ICD-10-CM

## 2017-11-04 MED ORDER — PROMETHAZINE HCL 25 MG PO TABS: 25.0000 mg | ORAL_TABLET | Freq: Three times a day (TID) | ORAL | 1 refills | Status: DC | PRN

## 2017-11-04 MED ORDER — ZOLMITRIPTAN 5 MG PO TABS: 2.5000 mg | ORAL_TABLET | ORAL | 1 refills | Status: DC | PRN
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
## 2017-11-04 NOTE — Patient Instructions (Addendum)
Take a dose of phenergan today when you get home.  Don't drive after taking that.   Use zomig after the phenergan.   Take flexeril at night.  Rest and fluids in the meantime.  Take care.  Glad to see you.

## 2017-11-07 NOTE — Assessment & Plan Note (Signed)
She used MRI in the past but is not using the med currently.  Med list updated.  She will update me as needed.  Letter done for patient stating her current medication use.
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
## 2017-11-08 ENCOUNTER — Encounter

## 2017-11-08 ENCOUNTER — Ambulatory Visit: Payer: Managed Care, Other (non HMO) | Admitting: Family Medicine

## 2017-11-25 ENCOUNTER — Other Ambulatory Visit: Payer: Self-pay | Admitting: Family Medicine

## 2017-11-25 NOTE — Telephone Encounter (Signed)
Electronic refill request. Alprazolam Last office visit:   11/04/17 Last Filled:    60 tablet 1 08/24/2017  Please advise.

## 2017-11-26 NOTE — Telephone Encounter (Signed)
Sent. Thanks.   

## 2017-12-13 ENCOUNTER — Telehealth: Payer: Self-pay

## 2017-12-13 MED ORDER — ALPRAZOLAM 1 MG PO TABS: 0.5000 mg | ORAL_TABLET | Freq: Two times a day (BID) | ORAL | 1 refills | Status: DC | PRN

## 2017-12-13 NOTE — Telephone Encounter (Signed)
Sent rx 1mg  tabs to replaced prev rx. Thanks.

## 2017-12-13 NOTE — Telephone Encounter (Signed)
Pt said pt is out of med, alprazolam. Pt said pharmacy is out of alprazolam 0.5 mg. And pt request to change to alprazolam 1 mg to [facility]. Pt has enough to last of alprazolam 0.5 mg until 12/16/17 when pt returns home. Pt is presently out of town but when returns home will need alprazolam to [employer]. Pt does not have ins now and request cheapest way to get the alprazolam. Pt uses for interstitial cystitis.

## 2017-12-13 NOTE — Telephone Encounter (Signed)
Patient advised.

## 2018-01-18 ENCOUNTER — Other Ambulatory Visit: Payer: Self-pay | Admitting: Family Medicine

## 2018-01-24 ENCOUNTER — Encounter: Payer: Self-pay | Admitting: Family Medicine

## 2018-01-24 ENCOUNTER — Ambulatory Visit: Payer: Self-pay | Admitting: Family Medicine

## 2018-01-24 VITALS — BP 130/78 | HR 89 | Temp 98.8°F | Ht 66.75 in | Wt 197.5 lb

## 2018-01-24 DIAGNOSIS — Z87820 Personal history of traumatic brain injury: Secondary | ICD-10-CM

## 2018-01-24 DIAGNOSIS — J069 Acute upper respiratory infection, unspecified: Secondary | ICD-10-CM

## 2018-01-24 DIAGNOSIS — N3010 Interstitial cystitis (chronic) without hematuria: Secondary | ICD-10-CM

## 2018-01-24 MED ORDER — ALPRAZOLAM 1 MG PO TABS: 0.5000 mg | ORAL_TABLET | Freq: Two times a day (BID) | ORAL | 5 refills | Status: DC | PRN

## 2018-01-24 MED ORDER — AMOXICILLIN-POT CLAVULANATE 875-125 MG PO TABS: 1.0000 | ORAL_TABLET | Freq: Two times a day (BID) | ORAL | 0 refills | Status: DC
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
## 2018-01-24 NOTE — Patient Instructions (Addendum)
If you aren't getting better in a few days, then start the antibiotics.  Rest and fluids.   Update me as needed.  Take care.  Glad to see you.

## 2018-01-25 NOTE — Assessment & Plan Note (Signed)
She is use Xanax episodically with relief.  Continue as is.  No adverse effect on medication.

## 2018-01-25 NOTE — Assessment & Plan Note (Signed)
Nontoxic.  Okay for outpatient follow-up.  Supportive care.  Hold antibiotic for now.  If not improving in the next few days and start Augmentin.  Update me as needed.  She agrees.
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
## 2018-04-11 ENCOUNTER — Other Ambulatory Visit: Payer: Self-pay | Admitting: Certified Nurse Midwife

## 2018-04-11 DIAGNOSIS — Z79890 Hormone replacement therapy (postmenopausal): Principal | ICD-10-CM

## 2018-04-11 DIAGNOSIS — N951 Menopausal and female climacteric states: Secondary | ICD-10-CM

## 2018-05-06 ENCOUNTER — Other Ambulatory Visit: Payer: Self-pay | Admitting: Certified Nurse Midwife

## 2018-05-06 DIAGNOSIS — Z79890 Hormone replacement therapy (postmenopausal): Principal | ICD-10-CM

## 2018-05-06 DIAGNOSIS — N951 Menopausal and female climacteric states: Secondary | ICD-10-CM

## 2018-06-02 ENCOUNTER — Ambulatory Visit: Payer: Managed Care, Other (non HMO) | Admitting: Certified Nurse Midwife

## 2018-06-08 ENCOUNTER — Ambulatory Visit (INDEPENDENT_AMBULATORY_CARE_PROVIDER_SITE_OTHER): Payer: PRIVATE HEALTH INSURANCE | Admitting: Family Medicine

## 2018-06-08 ENCOUNTER — Other Ambulatory Visit: Payer: Self-pay

## 2018-06-08 ENCOUNTER — Ambulatory Visit: Payer: Self-pay | Admitting: Family Medicine

## 2018-06-08 ENCOUNTER — Encounter: Payer: Self-pay | Admitting: Family Medicine

## 2018-06-08 VITALS — BP 126/80 | HR 81 | Temp 98.9°F | Ht 66.75 in | Wt 198.5 lb

## 2018-06-08 DIAGNOSIS — R0789 Other chest pain: Secondary | ICD-10-CM

## 2018-06-08 DIAGNOSIS — E785 Hyperlipidemia, unspecified: Secondary | ICD-10-CM | POA: Diagnosis not present

## 2018-06-08 LAB — COMPREHENSIVE METABOLIC PANEL
ALT: 22 U/L (ref 0–35)
AST: 16 U/L (ref 0–37)
Albumin: 4.4 g/dL (ref 3.5–5.2)
Alkaline Phosphatase: 64 U/L (ref 39–117)
BUN: 16 mg/dL (ref 6–23)
CO2: 30 mEq/L (ref 19–32)
Calcium: 9.6 mg/dL (ref 8.4–10.5)
Chloride: 103 mEq/L (ref 96–112)
Creatinine, Ser: 0.81 mg/dL (ref 0.40–1.20)
GFR: 74.98 mL/min (ref >60.00)
Glucose, Bld: 88 mg/dL (ref 70–99)
Potassium: 4.6 mEq/L (ref 3.5–5.1)
Sodium: 139 mEq/L (ref 135–145)
Total Bilirubin: 0.5 mg/dL (ref 0.2–1.2)
Total Protein: 7 g/dL (ref 6.0–8.3)

## 2018-06-08 LAB — CBC WITH DIFFERENTIAL/PLATELET
Basophils Absolute: 0 10*3/uL (ref 0.0–0.1)
Basophils Relative: 0.6 % (ref 0.0–3.0)
Eosinophils Absolute: 0.2 10*3/uL (ref 0.0–0.7)
Eosinophils Relative: 2.5 % (ref 0.0–5.0)
HCT: 44.1 % (ref 36.0–46.0)
Hemoglobin: 15.1 g/dL — ABNORMAL HIGH (ref 12.0–15.0)
Lymphocytes Relative: 26.3 % (ref 12.0–46.0)
Lymphs Abs: 1.6 10*3/uL (ref 0.7–4.0)
MCHC: 34.2 g/dL (ref 30.0–36.0)
MCV: 90 fl (ref 78.0–100.0)
Monocytes Absolute: 0.4 10*3/uL (ref 0.1–1.0)
Monocytes Relative: 6.5 % (ref 3.0–12.0)
Neutro Abs: 3.9 10*3/uL (ref 1.4–7.7)
Neutrophils Relative %: 64.1 % (ref 43.0–77.0)
Platelets: 202 10*3/uL (ref 150.0–400.0)
RBC: 4.9 Mil/uL (ref 3.87–5.11)
RDW: 13.2 % (ref 11.5–15.5)
WBC: 6.1 10*3/uL (ref 4.0–10.5)

## 2018-06-08 LAB — LDL CHOLESTEROL, DIRECT: Direct LDL: 125 mg/dL

## 2018-06-08 LAB — TSH: TSH: 1.4 u[IU]/mL (ref 0.35–4.50)

## 2018-06-08 MED ORDER — ASPIRIN EC 81 MG PO TBEC: 81.0000 mg | DELAYED_RELEASE_TABLET | Freq: Every day | ORAL | Status: DC

## 2018-06-08 NOTE — Progress Notes (Addendum)
This visit was conducted in person.  BP 126/80 (BP Location: Left Arm, Patient Position: Sitting, Cuff Size: Normal)   Pulse 81   Temp 98.9 F (37.2 C) (Oral)   Ht 5' 6.75" (1.695 m)   Wt 198 lb 8 oz (90 kg)   SpO2 97%   BMI 31.32 kg/m    CC: chest tightness Subjective:    Patient ID: Hannah [name], female    DOB: February 02, 1970, 48 y.o.   MRN: [id number]  HPI: Hannah [name] is a 48 y.o. female presenting on 06/08/2018 for Chest Pain (C/o chest tightness, SOB and indigestion.  Sxs 06/06/18. Has h/o same sxs about 5 yrs ago. )   2d ago while in the car felt nauseated, chest discomfort, had trouble finishing her dinner. Yesterday throughout the day had episodes of chest tightness associated with palpitations/racing heart and indigestion feeling (normally doesn't get indigestion). Acutely worse last night with shortness of breath described as trouble taking full breaths - tried taking 2 pepto bismols which helped indigestion. This morning again had bad indigestion, unable to finish her coffee, felt increased anxiety associated with shortness of breath, racing heart, chest pressure. Acutely worse at 8:30am, checked BP which was 127/77, HR . Chest discomfort comes on at rest, not exertional, not relieved by rest. No pain into jaw. Some discomfort down both arms.   Normally drinks 2 cups of coffee every morning.  Recent death in the family (FIL) 2 wks ago. Wonders if stress related.   Mother with MI age 12, did have heart history in her 67s (angina?) managed with nitroglycerin pills. Mom was not a smoker, did not have DM.   Patient denies h/o GERD, no known personal history of diabetes. Does have DM hx in the family.  S/p cholecystectomy, hysterectomy and BSO (2001).  This doesn't feel like hot flashes. On estradiol patch.  Migraines on PRN triptan - hasn't taken any in the past year. Averages 2 ibuprofen 200mg  a week.      Relevant past medical, surgical, family and social history reviewed and  updated as indicated. Interim medical history since our last visit reviewed. Allergies and medications reviewed and updated. Outpatient Medications Prior to Visit  Medication Sig Dispense Refill  . albuterol (PROVENTIL HFA;VENTOLIN HFA) 108 (90 Base) MCG/ACT inhaler Inhale 1-2 puffs into the lungs every 4 (four) hours as needed. For shortness of breath or wheezing 18 g 1  . ALPRAZolam (XANAX) 1 MG tablet Take 0.5 tablets (0.5 mg total) by mouth 2 (two) times daily as needed for anxiety. 30 tablet 5  . butalbital-aspirin-caffeine (FIORINAL) 50-325-40 MG capsule Take 1 capsule by mouth 2 (two) times daily as needed for headache. 30 capsule 1  . cyclobenzaprine (FLEXERIL) 10 MG tablet Take 0.5-1 tablets (5-10 mg total) by mouth 3 (three) times daily as needed. 30 tablet 1  . estradiol (VIVELLE-DOT) 0.05 MG/24HR patch PLACE 1 PATCH (0.05 MG TOTAL) ONTO THE SKIN 2 (TWO) TIMES A WEEK. 24 patch 3  . hydrocortisone (ANUSOL-HC) 25 MG suppository PLACE 1 SUPPOSITORY RECTALLY TWICE A DAY AS NEEDED FOR HEMORRHOIDS 12 suppository 0  . ibuprofen (ADVIL,MOTRIN) 600 MG tablet Take 1 tablet (600 mg total) by mouth every 6 (six) hours as needed. 30 tablet 0  . lidocaine (LIDODERM) 5 % Remove & Discard patch within 12 hours or as directed by MD 30 patch 1  . nitrofurantoin (MACRODANTIN) 100 MG capsule Take by mouth as needed.    . pramoxine-hydrocortisone (PROCTOCREAM-HC) 1-1 % rectal cream  Place 1 application rectally 2 (two) times daily as needed for hemorrhoids or anal itching. 30 g 1  . promethazine (PHENERGAN) 25 MG tablet Take 1 tablet (25 mg total) by mouth every 8 (eight) hours as needed for nausea or vomiting. Sedation caution 20 tablet 1  . valACYclovir (VALTREX) 500 MG tablet Take 2 tablets (1,000 mg total) by mouth 2 (two) times daily as needed. For fever blisters 60 tablet 0  . zolmitriptan (ZOMIG) 5 MG tablet Take 0.5-1 tablets (2.5-5 mg total) by mouth as needed for migraine. May repeat in 2 hours if not  resolved.  Note:  Limit of 2 in 24 hours. 10 tablet 1  . amoxicillin-clavulanate (AUGMENTIN) 875-125 MG tablet Take 1 tablet by mouth 2 (two) times daily. 20 tablet 0   No facility-administered medications prior to visit.      Per HPI unless specifically indicated in ROS section below Review of Systems Objective:    BP 126/80 (BP Location: Left Arm, Patient Position: Sitting, Cuff Size: Normal)   Pulse 81   Temp 98.9 F (37.2 C) (Oral)   Ht 5' 6.75" (1.695 m)   Wt 198 lb 8 oz (90 kg)   SpO2 97%   BMI 31.32 kg/m   Wt Readings from Last 3 Encounters:  06/08/18 198 lb 8 oz (90 kg)  01/24/18 197 lb 8 oz (89.6 kg)  11/04/17 193 lb 4 oz (87.7 kg)    Physical Exam Vitals signs and nursing note reviewed.  Constitutional:      Appearance: Normal appearance. She is well-developed. She is not ill-appearing.  HENT:     Head: Normocephalic and atraumatic.     Mouth/Throat:     Mouth: Mucous membranes are moist.     Pharynx: No posterior oropharyngeal erythema.  Cardiovascular:     Rate and Rhythm: Normal rate and regular rhythm.     Pulses: Normal pulses.     Heart sounds: Normal heart sounds. No murmur.  Pulmonary:     Effort: Pulmonary effort is normal. No respiratory distress.     Breath sounds: Normal breath sounds. No wheezing, rhonchi or rales.  Chest:     Chest wall: Tenderness (reproducible to mid sternum as well as bilateral 3nd costochondral junction) present.  Abdominal:     General: Abdomen is flat. Bowel sounds are normal. There is no distension.     Palpations: Abdomen is soft. There is no mass.     Tenderness: There is abdominal tenderness (mild) in the epigastric area. There is no guarding or rebound.     Hernia: No hernia is present.  Skin:    General: Skin is warm and dry.     Findings: No rash.  Neurological:     Mental Status: She is alert.  Psychiatric:        Mood and Affect: Mood normal.        Behavior: Behavior normal.       Lab Results   Component Value Date   CREATININE 0.89 08/24/2017   BUN 13 08/24/2017   NA 139 08/24/2017   K 4.2 08/24/2017   CL 104 08/24/2017   CO2 29 08/24/2017   Lab Results  Component Value Date   TSH 0.66 02/21/2016    Lab Results  Component Value Date   CHOL 176 02/21/2016   HDL 28.80 (L) 02/21/2016   LDLCALC 123 (H) 02/21/2016   TRIG 119.0 02/21/2016   CHOLHDL 6 02/21/2016    EKG - NSR rate 70, normal axis,  intervals, no acute ST/T changes, poor R wave progression with precordial t wave inversion/flattening Assessment & Plan:   Problem List Items Addressed This Visit    Dyslipidemia   Atypical chest pain - Primary    Concerning description of pain (substernal tightness/pressure associated with dyspnea and indigestion) however not exertional, not relieved by rest. She does have cardiac history (mother) and is on hormone therapy (estrogen patch). Check EKG and labs today. Reassuringly has reproducible chest wall tenderness today but this feels different than her chest pressure sensation. Given risk factors I did recommend eval by cardiology - referral placed. rec start aspirin 81mg  daily and limit exertion until seen by cardiology. If recurrent/wrosening chest pain, discussed seek ER care.      Relevant Orders   TSH   Comprehensive metabolic panel   CBC with Differential/Platelet   LDL Cholesterol, Direct   EKG 12-Lead (Completed)   Ambulatory referral to Cardiology       Meds ordered this encounter  Medications  . aspirin EC 81 MG tablet    Sig: Take 1 tablet (81 mg total) by mouth daily.   Orders Placed This Encounter  Procedures  . TSH  . Comprehensive metabolic panel  . CBC with Differential/Platelet  . LDL Cholesterol, Direct  . Ambulatory referral to Cardiology    Referral Priority:   Urgent    Referral Type:   Consultation    Referral Reason:   Specialty Services Required    Requested Specialty:   Cardiology    Number of Visits Requested:   1  . EKG 12-Lead     Follow up plan: Return if symptoms worsen or fail to improve.  Ria Bush, MD  [phone], [phone]

## 2018-06-08 NOTE — Patient Instructions (Addendum)
EKG today.  Labs today.  Start aspirin 81mg  daily. Avoid ibuprofen.  Take it easy until you see cardiologist.  Madaline Brilliant to try xanax.  We will refer you to cardiology for further evalauation. See Rosaria Ferries to schedule appointment.  If recurrent or worsening chest pain, seek urgent care.
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
## 2018-06-09 ENCOUNTER — Telehealth: Payer: Self-pay | Admitting: Cardiovascular Disease

## 2018-06-09 NOTE — Telephone Encounter (Signed)
Virtual Visit Pre-Appointment Phone Call  "(Name), I am calling you today to discuss your upcoming appointment. We are currently trying to limit exposure to the virus that causes COVID-19 by seeing patients at home rather than in the office."  1. "What is the BEST phone number to call the day of the visit?" - include this in appointment notes  2. "Do you have or have access to (through a family member/friend) a smartphone with video capability that we can use for your visit?" a. If yes - list this number in appt notes as "cell" (if different from BEST phone #) and list the appointment type as a VIDEO visit in appointment notes b. If no - list the appointment type as a PHONE visit in appointment notes  Confirm consent - "In the setting of the current Covid19 crisis, you are scheduled for a (phone or video) visit with your provider on (date) at (time).  Just as we do with many in-office visits, in order for you to participate in this visit, we must obtain consent.  If you'd like, I can send this to your mychart (if signed up) or email for you to review.  Otherwise, I can obtain your verbal consent now.  All virtual visits are billed to your insurance company just like a normal visit would be.  By agreeing to a virtual visit, we'd like you to understand that the technology does not allow for your provider to perform an examination, and thus may limit your provider's ability to fully assess your condition. If your provider identifies any concerns that need to be evaluated in person, we will make arrangements to do so.  Finally, though the technology is pretty good, we cannot assure that it will always work on either your or our end, and in the setting of a video visit, we may have to convert it to a phone-only visit.  In either situation, we cannot ensure that we have a secure connection.  Are you willing to proceed?" STAFF: Did the patient verbally acknowledge consent to telehealth visit? Document  YES/NO here:  YES  3. Advise patient to be prepared - "Two hours prior to your appointment, go ahead and check your blood pressure, pulse, oxygen saturation, and your weight (if you have the equipment to check those) and write them all down. When your visit starts, your provider will ask you for this information. If you have an Apple Watch or Kardia device, please plan to have heart rate information ready on the day of your appointment. Please have a pen and paper handy nearby the day of the visit as well."  4. Give patient instructions for MyChart download to smartphone OR Doximity/Doxy.me as below if video visit (depending on what platform provider is using)  5. Inform patient they will receive a phone call 15 minutes prior to their appointment time (may be from unknown caller ID) so they should be prepared to answer    TELEPHONE CALL NOTE  Hannah [name] has been deemed a candidate for a follow-up tele-health visit to limit community exposure during the Covid-19 pandemic. I spoke with the patient via phone to ensure availability of phone/video source, confirm preferred email & phone number, and discuss instructions and expectations.  I reminded Hannah [name] to be prepared with any vital sign and/or heart rhythm information that could potentially be obtained via home monitoring, at the time of her visit. I reminded Hannah [name] to expect a phone call prior to  her visit.  Hannah [name] 06/09/2018 8:56 AM   INSTRUCTIONS FOR DOWNLOADING THE MYCHART APP TO SMARTPHONE  - The patient must first make sure to have activated MyChart and know their login information - If Apple, go to [employer] and type in MyChart in the search bar and download the app. If Android, ask patient to go to [employer] and type in [place] in the search bar and download the app. The app is free but as with any other app downloads, their phone may require them to verify saved payment information or Apple/Android  password.  - The patient will need to then log into the app with their MyChart username and password, and select [facility] as their healthcare provider to link the account. When it is time for your visit, go to the MyChart app, find appointments, and click Begin Video Visit. Be sure to Select Allow for your device to access the Microphone and Camera for your visit. You will then be connected, and your provider will be with you shortly.  **If they have any issues connecting, or need assistance please contact MyChart service desk [phone])**  **If using a computer, in order to ensure the best quality for their visit they will need to use either of the following Internet Browsers: [employer], or Google Chrome**  IF USING DOXIMITY or DOXY.ME - The patient will receive a link just prior to their visit by text.     FULL LENGTH CONSENT FOR TELE-HEALTH VISIT   I hereby voluntarily request, consent and authorize [facility] and its employed or contracted physicians, physician assistants, nurse practitioners or other licensed health care professionals (the Practitioner), to provide me with telemedicine health care services (the "Services") as deemed necessary by the treating Practitioner. I acknowledge and consent to receive the Services by the Practitioner via telemedicine. I understand that the telemedicine visit will involve communicating with the Practitioner through live audiovisual communication technology and the disclosure of certain medical information by electronic transmission. I acknowledge that I have been given the opportunity to request an in-person assessment or other available alternative prior to the telemedicine visit and am voluntarily participating in the telemedicine visit.  I understand that I have the right to withhold or withdraw my consent to the use of telemedicine in the course of my care at any time, without affecting my right to future care or treatment,  and that the Practitioner or I may terminate the telemedicine visit at any time. I understand that I have the right to inspect all information obtained and/or recorded in the course of the telemedicine visit and may receive copies of available information for a reasonable fee.  I understand that some of the potential risks of receiving the Services via telemedicine include:  Marland Kitchen Delay or interruption in medical evaluation due to technological equipment failure or disruption; . Information transmitted may not be sufficient (e.g. poor resolution of images) to allow for appropriate medical decision making by the Practitioner; and/or  . In rare instances, security protocols could fail, causing a breach of personal health information.  Furthermore, I acknowledge that it is my responsibility to provide information about my medical history, conditions and care that is complete and accurate to the best of my ability. I acknowledge that Practitioner's advice, recommendations, and/or decision may be based on factors not within their control, such as incomplete or inaccurate data provided by me or distortions of diagnostic images or specimens that may result from electronic transmissions. I understand  that the practice of medicine is not an exact science and that Practitioner makes no warranties or guarantees regarding treatment outcomes. I acknowledge that I will receive a copy of this consent concurrently upon execution via email to the email address I last provided but may also request a printed copy by calling the office of [facility].    I understand that my insurance will be billed for this visit.   I have read or had this consent read to me. . I understand the contents of this consent, which adequately explains the benefits and risks of the Services being provided via telemedicine.  . I have been provided ample opportunity to ask questions regarding this consent and the Services and have had my questions  answered to my satisfaction. . I give my informed consent for the services to be provided through the use of telemedicine in my medical care  By participating in this telemedicine visit I agree to the above.

## 2018-06-11 NOTE — Progress Notes (Signed)
Virtual Visit via Video Note   This visit type was conducted due to national recommendations for restrictions regarding the COVID-19 Pandemic (e.g. social distancing) in an effort to limit this patient's exposure and mitigate transmission in our community.  Due to her co-morbid illnesses, this patient is at least at moderate risk for complications without adequate follow up.  This format is felt to be most appropriate for this patient at this time.  All issues noted in this document were discussed and addressed.  A limited physical exam was performed with this format.  Please refer to the patient's chart for her consent to telehealth for [facility].   I connected with  Hannah [name] on 06/13/18 by a video enabled telemedicine application and verified that I am speaking with the correct person using two identifiers. I discussed the limitations of evaluation and management by telemedicine. The patient expressed understanding and agreed to proceed.   Evaluation Performed:  Follow-up visit  Date:  06/13/2018   ID:  Hannah [name], DOB 1970-12-20, MRN [id number]  Patient Location:  [place] [address]   Provider location:   [facility], [place] office  PCP:  Tonia Ghent, MD  Cardiologist:  Patsy Baltimore   Chief Complaint:  Chest pain   History of Present Illness:    Hannah [name] is a 48 y.o. female who presents via audio/video conferencing for a telehealth visit today.   The patient does not symptoms concerning for COVID-19 infection (fever, chills, cough, or new SHORTNESS OF BREATH).   Patient has a past medical history of Anxiety Hyperlipidemia Aortic atherosclerosis seen on CT scan 2018 Referred by Dr. Danise Mina for chest pain  She reports having symptoms that started 1 week ago Last week, had tightness, felt like a vice across chest, funny feeling, on Monday Happened again Tuesday and Wednesday Then chest pain on Thursday Does  not think it is anxiety although there have been some stressful things going on Heart racing, chest tightness with PMD Has not recorded any heart rates  Father in law passed away, 3 wks ago, Inherited their dog  Good exercise tolerance with no chest pain on exertion  CT scan lumbar spine  Images pulled up and reviewed on today's evaluation  showing small regions aortic atherosclerosis distal aorta at the bifurcation/iliacs, also small amount of atherosclerosis noted in the proximal common iliac vessels  Mom with CAD, MI at age 32 Father with no significant cardiac issues  Prior CV studies:   The following studies were reviewed today:    Past Medical History:  Diagnosis Date  . Calculus of kidney   . Chronic headaches   . Gross hematuria   . IC (interstitial cystitis)    per Dr. Jacqlyn Larsen, s/p botox injection  . Neoplasm of uncertain behavior of bladder   . Other chronic cystitis   . Other symptoms involving urinary system(788.99)    Past Surgical History:  Procedure Laterality Date  . ABDOMINAL HYSTERECTOMY  10/1999  . APPENDECTOMY    . BILATERAL SALPINGOOPHORECTOMY    . BOTOX INJECTION     in bladder  . CESAREAN SECTION    . CHOLECYSTECTOMY  10/23/2005   Lap  . CT ABD W & PELVIS WO CM  10/09   Thickening and irreg dome of bladder.  Diverticulosis  . Cystoscopy/bladder Bx  11/03/07   Path...inflammation.  Hydrodistention Excoriations (Dr. Jacqlyn Larsen)   . CYSTOSTOMY W/ BLADDER BIOPSY    .  LAPAROSCOPIC APPENDECTOMY  10/2013   [facility]  . LAPAROSCOPY ABDOMEN DIAGNOSTIC  07/1999   exploration adhesions to ovaries  . LUMBAR SPINE SURGERY    . Physical Therapy  12/1997; 01/06/1998; 03/1998     No outpatient medications have been marked as taking for the 06/13/18 encounter (Telemedicine) with Minna Merritts, MD.     Allergies:   Alfalfa; Sulfamethoxazole-trimethoprim; Sulfonamide derivatives; and Tramadol   Social History   Tobacco Use  . Smoking status: Never Smoker  .  Smokeless tobacco: Never Used  Substance Use Topics  . Alcohol use: No  . Drug use: No     Current Outpatient Medications on File Prior to Visit  Medication Sig Dispense Refill  . albuterol (PROVENTIL HFA;VENTOLIN HFA) 108 (90 Base) MCG/ACT inhaler Inhale 1-2 puffs into the lungs every 4 (four) hours as needed. For shortness of breath or wheezing 18 g 1  . ALPRAZolam (XANAX) 1 MG tablet Take 0.5 tablets (0.5 mg total) by mouth 2 (two) times daily as needed for anxiety. 30 tablet 5  . aspirin EC 81 MG tablet Take 1 tablet (81 mg total) by mouth daily.    . butalbital-aspirin-caffeine (FIORINAL) 50-325-40 MG capsule Take 1 capsule by mouth 2 (two) times daily as needed for headache. 30 capsule 1  . cyclobenzaprine (FLEXERIL) 10 MG tablet Take 0.5-1 tablets (5-10 mg total) by mouth 3 (three) times daily as needed. 30 tablet 1  . estradiol (VIVELLE-DOT) 0.05 MG/24HR patch PLACE 1 PATCH (0.05 MG TOTAL) ONTO THE SKIN 2 (TWO) TIMES A WEEK. 24 patch 3  . hydrocortisone (ANUSOL-HC) 25 MG suppository PLACE 1 SUPPOSITORY RECTALLY TWICE A DAY AS NEEDED FOR HEMORRHOIDS 12 suppository 0  . ibuprofen (ADVIL,MOTRIN) 600 MG tablet Take 1 tablet (600 mg total) by mouth every 6 (six) hours as needed. 30 tablet 0  . lidocaine (LIDODERM) 5 % Remove & Discard patch within 12 hours or as directed by MD 30 patch 1  . nitrofurantoin (MACRODANTIN) 100 MG capsule Take by mouth as needed.    . pramoxine-hydrocortisone (PROCTOCREAM-HC) 1-1 % rectal cream Place 1 application rectally 2 (two) times daily as needed for hemorrhoids or anal itching. 30 g 1  . promethazine (PHENERGAN) 25 MG tablet Take 1 tablet (25 mg total) by mouth every 8 (eight) hours as needed for nausea or vomiting. Sedation caution 20 tablet 1  . valACYclovir (VALTREX) 500 MG tablet Take 2 tablets (1,000 mg total) by mouth 2 (two) times daily as needed. For fever blisters 60 tablet 0  . zolmitriptan (ZOMIG) 5 MG tablet Take 0.5-1 tablets (2.5-5 mg total)  by mouth as needed for migraine. May repeat in 2 hours if not resolved.  Note:  Limit of 2 in 24 hours. 10 tablet 1   No current facility-administered medications on file prior to visit.      Family Hx: The patient's family history includes Colon polyps in her mother; Diabetes in her father and paternal grandmother; Heart attack in her mother. There is no history of Cancer.  ROS:   Please see the history of present illness.    Review of Systems  Constitutional: Negative.   Respiratory: Negative.   Cardiovascular: Positive for chest pain.  Gastrointestinal: Negative.   Musculoskeletal: Negative.   Neurological: Negative.   Psychiatric/Behavioral: The patient is nervous/anxious.   All other systems reviewed and are negative.    Labs/Other Tests and Data Reviewed:    Recent Labs: 06/08/2018: ALT 22; BUN 16; Creatinine, Ser 0.81; Hemoglobin 15.1; Platelets  202.0; Potassium 4.6; Sodium 139; TSH 1.40   Recent Lipid Panel Lab Results  Component Value Date/Time   CHOL 176 02/21/2016 09:13 AM   TRIG 119.0 02/21/2016 09:13 AM   HDL 28.80 (L) 02/21/2016 09:13 AM   CHOLHDL 6 02/21/2016 09:13 AM   LDLCALC 123 (H) 02/21/2016 09:13 AM   LDLDIRECT 125.0 06/08/2018 11:02 AM    Wt Readings from Last 3 Encounters:  06/08/18 198 lb 8 oz (90 kg)  01/24/18 197 lb 8 oz (89.6 kg)  11/04/17 193 lb 4 oz (87.7 kg)     Exam:    Vital Signs: Vital signs may also be detailed in the HPI There were no vitals taken for this visit.  Wt Readings from Last 3 Encounters:  06/08/18 198 lb 8 oz (90 kg)  01/24/18 197 lb 8 oz (89.6 kg)  11/04/17 193 lb 4 oz (87.7 kg)   Temp Readings from Last 3 Encounters:  06/08/18 98.9 F (37.2 C) (Oral)  01/24/18 98.8 F (37.1 C) (Oral)  11/04/17 99 F (37.2 C) (Oral)   BP Readings from Last 3 Encounters:  06/08/18 126/80  01/24/18 130/78  11/04/17 108/64   Pulse Readings from Last 3 Encounters:  06/08/18 81  01/24/18 89  11/04/17 82    120/80 pulse 80  respirations 16  Well nourished, well developed female in no acute distress. Constitutional:  oriented to person, place, and time. No distress.  Head: Normocephalic and atraumatic.  Eyes:  no discharge. No scleral icterus.  Neck: Normal range of motion. Neck supple.  Pulmonary/Chest: No audible wheezing, no distress, appears comfortable Musculoskeletal: Normal range of motion.  no  tenderness or deformity.  Neurological:   Coordination normal. Full exam not performed Skin:  No rash Psychiatric:  normal mood and affect. behavior is normal. Thought content normal.    ASSESSMENT & PLAN:    Atypical chest pain - Plan: CT CARDIAC SCORING Long discussion with her concerning etiology of her chest pain Nonspecific T wave abnormality V1 through V3 likely secondary to lead placement No change in EKG compared to 2015 She does have family history,  Discussed risk factors non-smoker nondiabetic, does have mildly elevated cholesterol Very small amount aortic atherosclerosis seen on CT scan 2018, likely low risk of having underlying ischemia as a cause of her chest pain -Discussed options for further work-up, we are currently not performing treadmill stress test or treadmill stress echo given COVID virus -Recommended CT coronary calcium scoring if she would like risk stratification She is interested and we will schedule this at her convenience for guidance For elevated score she would benefit from a statin  Anxiety Reassurance provided Recent stressors with recent passing of her father-in-law, inheriting his dog  Dyslipidemia LDL 125 Discussed lifestyle modification May need a statin for atherosclerosis.  Will wait on CT coronary calcium scan results  Aortic atherosclerosis (HCC) - Plan: CT CARDIAC SCORING Very small amount of aortic atherosclerosis seen on CT scan 2018 Discussed with her in detail   COVID-19 Education: The signs and symptoms of COVID-19 were discussed with the patient  and how to seek care for testing (follow up with PCP or arrange E-visit).  The importance of social distancing was discussed today.  Patient Risk:   After full review of this patients clinical status, I feel that they are at least moderate risk at this time.  Time:   Today, I have spent 60 minutes with the patient with telehealth technology discussing the cardiac and medical problems/diagnoses detailed  above   10 min spent reviewing the chart prior to patient visit today   Medication Adjustments/Labs and Tests Ordered: Current medicines are reviewed at length with the patient today.  Concerns regarding medicines are outlined above.   Tests Ordered: No tests ordered   Medication Changes: No changes made   Disposition: Follow-up as needed We will call with the scan results   Signed, Ida Rogue, MD  06/13/2018 5:21 PM    [facility] [address], [place], [place] [address]

## 2018-06-13 ENCOUNTER — Other Ambulatory Visit: Payer: Self-pay

## 2018-06-13 ENCOUNTER — Telehealth (INDEPENDENT_AMBULATORY_CARE_PROVIDER_SITE_OTHER): Payer: PRIVATE HEALTH INSURANCE | Admitting: Cardiovascular Disease

## 2018-06-13 DIAGNOSIS — I700 Atherosclerosis of aorta: Secondary | ICD-10-CM

## 2018-06-13 DIAGNOSIS — F419 Anxiety disorder, unspecified: Secondary | ICD-10-CM

## 2018-06-13 DIAGNOSIS — E785 Hyperlipidemia, unspecified: Secondary | ICD-10-CM

## 2018-06-13 DIAGNOSIS — R0789 Other chest pain: Secondary | ICD-10-CM | POA: Diagnosis not present

## 2018-06-13 NOTE — Patient Instructions (Addendum)
Medication Instructions:  No changes  If you need a refill on your cardiac medications before your next appointment, please call your pharmacy.    Lab work: No new labs needed   If you have labs (blood work) drawn today and your tests are completely normal, you will receive your results only by: Marland Kitchen MyChart Message (if you have MyChart) OR . A paper copy in the mail If you have any lab test that is abnormal or we need to change your treatment, we will call you to review the results.   Testing/Procedures: Your physician has recommended that you have a CT Calcium Score  $150 out of pocket Please call [phone] to schedule. This can be done at your convenience.  [place], [place] [address]   Follow-Up: At [facility], you and your health needs are our priority.  As part of our continuing mission to provide you with exceptional heart care, we have created designated Provider Care Teams.  These Care Teams include your primary Cardiologist (physician) and Advanced Practice Providers (APPs -  Physician Assistants and Nurse Practitioners) who all work together to provide you with the care you need, when you need it.  . You will need a follow up appointment as needed  . Providers on your designated Care Team:   . Murray Hodgkins, NP . Christell Faith, PA-C . Marrianne Mood, PA-C  Any Other Special Instructions Will Be Listed Below (If Applicable).  For educational health videos Log in to : [email or web address] Or : [email or web address], password : [email or web address]

## 2018-06-18 ENCOUNTER — Other Ambulatory Visit: Payer: Self-pay | Admitting: Family Medicine

## 2018-06-20 NOTE — Telephone Encounter (Signed)
Electronic refill request. Alprazolam Last office visit:   06/08/2018 Last Filled:    30 tablet 5 01/24/2018  Please advise.

## 2018-06-21 NOTE — Telephone Encounter (Signed)
Sent. Thanks.   

## 2018-06-24 ENCOUNTER — Telehealth: Payer: Self-pay | Admitting: Family Medicine

## 2018-06-24 MED ORDER — HYDROCORTISONE ACETATE 25 MG RE SUPP: RECTAL | 1 refills | Status: DC

## 2018-06-24 NOTE — Telephone Encounter (Signed)
Spoke to pt

## 2018-06-24 NOTE — Telephone Encounter (Signed)
Sent. Thanks.   

## 2018-06-24 NOTE — Telephone Encounter (Signed)
Patient stated that she has been using a cream but she is stating that she is needing a suppository that will go internally. She stated that the cream does not seem to be helping with her Hemmorids and neither are the over the counter meds . She is going out of town and would like this sent as soon as possible.    [facility] DrLorina Rabon    Patient C/B # [phone]

## 2018-06-30 ENCOUNTER — Other Ambulatory Visit: Payer: Self-pay

## 2018-07-04 ENCOUNTER — Ambulatory Visit (INDEPENDENT_AMBULATORY_CARE_PROVIDER_SITE_OTHER): Payer: PRIVATE HEALTH INSURANCE | Admitting: Certified Nurse Midwife

## 2018-07-04 ENCOUNTER — Encounter: Payer: Self-pay | Admitting: Certified Nurse Midwife

## 2018-07-04 ENCOUNTER — Other Ambulatory Visit: Payer: Self-pay

## 2018-07-04 ENCOUNTER — Other Ambulatory Visit (HOSPITAL_COMMUNITY)
Admission: RE | Admit: 2018-07-04 | Discharge: 2018-07-04 | Disposition: A | Discharge status: Home / Self Care | Payer: PRIVATE HEALTH INSURANCE | Source: Ambulatory Visit | Attending: Certified Nurse Midwife | Admitting: Certified Nurse Midwife

## 2018-07-04 VITALS — BP 118/70 | HR 70 | Temp 97.6°F | Resp 16 | Ht 66.5 in | Wt 196.0 lb

## 2018-07-04 DIAGNOSIS — Z01419 Encounter for gynecological examination (general) (routine) without abnormal findings: Secondary | ICD-10-CM

## 2018-07-04 DIAGNOSIS — Z124 Encounter for screening for malignant neoplasm of cervix: Secondary | ICD-10-CM | POA: Insufficient documentation

## 2018-07-04 NOTE — Progress Notes (Signed)
48 y.o. [id number] Married  Caucasian Fe here for annual exam. Perimenopausal on Vivelle-Dot for symptomatic relief. She was seen by cardiology for chest pain which occurred after death in family and family care and trying to return to work. She would like to reduce her dosage of Vivelle-Dot, because she is doing well. She has not had episodes before or after this occurrence.Has cardiac scoring evaluation scheduled. Sees PCP for aex and labs were done last visit, everything normal. Patient will need update on hemorrhoid medication, will call if change.  No LMP recorded. Patient has had a hysterectomy.          Sexually active: Yes.    The current method of family planning is status post hysterectomy.    Exercising: No.  exercise Smoker:  no  Review of Systems  Constitutional: Negative.   HENT: Negative.   Eyes: Negative.   Respiratory: Negative.   Cardiovascular: Negative.   Gastrointestinal: Negative.   Genitourinary: Negative.   Musculoskeletal: Negative.   Skin:       hemorrhoids  Neurological: Negative.   Endo/Heme/Allergies: Negative.   Psychiatric/Behavioral: Negative.     Health Maintenance: Pap:  12-11-15 neg HPV HR neg, 05-27-17 neg, patient request pap yearly History of Abnormal Pap: no MMG:  06-18-17 category a density birads 1:neg Self Breast exams: no Colonoscopy:  none BMD:   none TDaP:  2017 Shingles: not done Pneumonia: not done Hep C and HIV: maybe during pregnancy Labs: if needed   reports that she has never smoked. She has never used smokeless tobacco. She reports that she does not drink alcohol or use drugs.  Past Medical History:  Diagnosis Date  . Calculus of kidney   . Chronic headaches   . Gross hematuria   . IC (interstitial cystitis)    per Dr. Jacqlyn Larsen, s/p botox injection  . Neoplasm of uncertain behavior of bladder   . Other chronic cystitis   . Other symptoms involving urinary system(788.99)     Past Surgical History:  Procedure Laterality Date   . ABDOMINAL HYSTERECTOMY  10/1999  . APPENDECTOMY    . BILATERAL SALPINGOOPHORECTOMY    . BOTOX INJECTION     in bladder  . CESAREAN SECTION    . CHOLECYSTECTOMY  10/23/2005   Lap  . CT ABD W & PELVIS WO CM  10/09   Thickening and irreg dome of bladder.  Diverticulosis  . Cystoscopy/bladder Bx  11/03/07   Path...inflammation.  Hydrodistention Excoriations (Dr. Jacqlyn Larsen)   . CYSTOSTOMY W/ BLADDER BIOPSY    . LAPAROSCOPIC APPENDECTOMY  10/2013   [facility]  . LAPAROSCOPY ABDOMEN DIAGNOSTIC  07/1999   exploration adhesions to ovaries  . LUMBAR SPINE SURGERY    . Physical Therapy  12/1997; 01/06/1998; 03/1998    Current Outpatient Medications  Medication Sig Dispense Refill  . albuterol (PROVENTIL HFA;VENTOLIN HFA) 108 (90 Base) MCG/ACT inhaler Inhale 1-2 puffs into the lungs every 4 (four) hours as needed. For shortness of breath or wheezing 18 g 1  . ALPRAZolam (XANAX) 0.5 MG tablet TAKE 1 TABLET BY MOUTH TWICE A DAY AS NEEDED FOR ANXIETY 60 tablet 5  . aspirin EC 81 MG tablet Take 1 tablet (81 mg total) by mouth daily.    . butalbital-aspirin-caffeine (FIORINAL) 50-325-40 MG capsule Take 1 capsule by mouth 2 (two) times daily as needed for headache. 30 capsule 1  . cyclobenzaprine (FLEXERIL) 10 MG tablet Take 0.5-1 tablets (5-10 mg total) by mouth 3 (three) times daily as needed. 30 tablet  1  . estradiol (VIVELLE-DOT) 0.05 MG/24HR patch PLACE 1 PATCH (0.05 MG TOTAL) ONTO THE SKIN 2 (TWO) TIMES A WEEK. 24 patch 3  . hydrocortisone (ANUSOL-HC) 25 MG suppository PLACE 1 SUPPOSITORY RECTALLY TWICE A DAY AS NEEDED FOR HEMORRHOIDS 12 suppository 1  . ibuprofen (ADVIL,MOTRIN) 600 MG tablet Take 1 tablet (600 mg total) by mouth every 6 (six) hours as needed. 30 tablet 0  . lidocaine (LIDODERM) 5 % Remove & Discard patch within 12 hours or as directed by MD 30 patch 1  . nitrofurantoin (MACRODANTIN) 100 MG capsule Take by mouth as needed.    . pramoxine-hydrocortisone (PROCTOCREAM-HC) 1-1 % rectal  cream Place 1 application rectally 2 (two) times daily as needed for hemorrhoids or anal itching. 30 g 1  . promethazine (PHENERGAN) 25 MG tablet Take 1 tablet (25 mg total) by mouth every 8 (eight) hours as needed for nausea or vomiting. Sedation caution 20 tablet 1  . valACYclovir (VALTREX) 500 MG tablet Take 2 tablets (1,000 mg total) by mouth 2 (two) times daily as needed. For fever blisters 60 tablet 0  . zolmitriptan (ZOMIG) 5 MG tablet Take 0.5-1 tablets (2.5-5 mg total) by mouth as needed for migraine. May repeat in 2 hours if not resolved.  Note:  Limit of 2 in 24 hours. 10 tablet 1   No current facility-administered medications for this visit.     Family History  Problem Relation Age of Onset  . Colon polyps Mother        mother with polyps before age 59  . Heart attack Mother   . Diabetes Father   . Diabetes Paternal Grandmother   . Cancer Neg Hx     ROS:  Pertinent items are noted in HPI.  Otherwise, a comprehensive ROS was negative.  Exam:   BP 118/70   Pulse 70   Temp 97.6 F (36.4 C) (Skin)   Resp 16   Ht 5' 6.5" (1.689 m)   Wt 196 lb (88.9 kg)   BMI 31.16 kg/m  Height: 5' 6.5" (168.9 cm) Ht Readings from Last 3 Encounters:  07/04/18 5' 6.5" (1.689 m)  06/08/18 5' 6.75" (1.695 m)  01/24/18 5' 6.75" (1.695 m)    General appearance: alert, cooperative and appears stated age Head: Normocephalic, without obvious abnormality, atraumatic Neck: no adenopathy, supple, symmetrical, trachea midline and thyroid normal to inspection and palpation Lungs: clear to auscultation bilaterally Breasts: normal appearance, no masses or tenderness, No nipple retraction or dimpling, No nipple discharge or bleeding, No axillary or supraclavicular adenopathy Heart: regular rate and rhythm Abdomen: soft, non-tender; no masses,  no organomegaly Extremities: extremities normal, atraumatic, no cyanosis or edema Skin: Skin color, texture, turgor normal. No rashes or lesions Lymph nodes:  Cervical, supraclavicular, and axillary nodes normal. No abnormal inguinal nodes palpated Neurologic: Grossly normal   Pelvic: External genitalia:  no lesions              Urethra:  normal appearing urethra with no masses, tenderness or lesions              Bartholin's and Skene's: normal                 Vagina: normal appearing vagina with normal color and discharge, no lesions              Cervix: absent              Pap taken: Yes.   Bimanual Exam:  Uterus:  uterus  absent              Adnexa: no mass, fullness, tenderness and adnexa bilateral surgically absent               Rectovaginal: Confirms               Anus:  normal sphincter tone, no lesions  Chaperone present: yes  A:  Well Woman with normal exam  Perimenopausal on ERT with Vivelle Dot, would like to continue, does not need Rx at this time.  Cardiology evaluation with very small amount of atherosclerosis, but no concerns per cardiology of etiology of chest pain ( stress related?) See note in epic. Has cardiac scoring for this scheduled in July. Will plan to continue ERT unless contraindications at that time. Patient will discuss with cardiology. ( aware of estrogen use.  Chronic history of IC with Botox use with good success.  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  History of hemorrhoids Anusol HC working well, no refill needed at present, will call.  P:   Reviewed health and wellness pertinent to exam  Discussed risks/benefits/warning signs with ERT use. Patient aware and plans to continue unless cardiology recommends not to use. No refill given, mammogram due.  Continue follow up with Urology as indicated.  Will call when needed.   Warning signs with hemorrhoids given.  Pap smear: yes   counseled on breast self exam, mammography screening, feminine hygiene, menopause, adequate intake of calcium and vitamin D, diet and exercise  return annually or prn  An After Visit Summary was printed and given to the  patient.

## 2018-07-06 LAB — CYTOLOGY - PAP
Diagnosis: NEGATIVE
HPV: NOT DETECTED

## 2018-07-25 ENCOUNTER — Telehealth: Payer: Self-pay | Admitting: *Deleted

## 2018-07-25 NOTE — Telephone Encounter (Signed)

## 2018-07-26 ENCOUNTER — Other Ambulatory Visit: Payer: Self-pay

## 2018-07-26 ENCOUNTER — Ambulatory Visit
Admission: RE | Admit: 2018-07-26 | Discharge: 2018-07-26 | Disposition: A | Discharge status: Home / Self Care | Payer: Self-pay | Source: Ambulatory Visit | Attending: Cardiovascular Disease | Admitting: Cardiovascular Disease

## 2018-07-26 DIAGNOSIS — I700 Atherosclerosis of aorta: Secondary | ICD-10-CM

## 2018-07-26 DIAGNOSIS — R0789 Other chest pain: Secondary | ICD-10-CM

## 2018-09-01 ENCOUNTER — Other Ambulatory Visit: Payer: Self-pay | Admitting: Certified Nurse Midwife

## 2018-09-01 DIAGNOSIS — Z1231 Encounter for screening mammogram for malignant neoplasm of breast: Secondary | ICD-10-CM

## 2018-09-21 ENCOUNTER — Telehealth: Payer: Self-pay | Admitting: Certified Nurse Midwife

## 2018-09-21 ENCOUNTER — Other Ambulatory Visit: Payer: Self-pay

## 2018-09-21 ENCOUNTER — Ambulatory Visit (INDEPENDENT_AMBULATORY_CARE_PROVIDER_SITE_OTHER): Payer: PRIVATE HEALTH INSURANCE | Admitting: Certified Nurse Midwife

## 2018-09-21 ENCOUNTER — Encounter: Payer: Self-pay | Admitting: Certified Nurse Midwife

## 2018-09-21 VITALS — BP 110/80 | HR 70 | Temp 97.3°F | Resp 16 | Wt 200.0 lb

## 2018-09-21 DIAGNOSIS — Z0000 Encounter for general adult medical examination without abnormal findings: Secondary | ICD-10-CM

## 2018-09-21 NOTE — Patient Instructions (Signed)

## 2018-09-21 NOTE — Telephone Encounter (Signed)
Patient has a protruding spot on her right breast. No soreness and not as red as before.

## 2018-09-21 NOTE — Progress Notes (Addendum)
   Subjective:   48 y.o. Married Caucasian female presents for evaluation of right breast bump. Onset of the symptoms was4-5 weeks ago. Patient sought evaluation because of skin bump with redness.  Contributing factors none known. Denies chills, fevers, night sweats and or breast pain.. Patient denies history of trauma, bites, or injuries. Last mammogram was 06/18/17 which was normal.  Previous evaluation has included none. Patient and spouse have watching area for the past 3-4 weeks and spouse feels it is resolving and less red. She does not feel it is prominent or firm today. No exudate or tenderness.   Review of Systems Pertinent items noted in HPI and remainder of comprehensive ROS otherwise negative.   Objective:   @General  appearance: alert, cooperative, appears stated age and no distress Breasts: Right normal appearance, no masses or tenderness, No nipple retraction or dimpling, No nipple discharge or bleeding, No axillary or supraclavicular adenopathy, small healed appearing skin papule, now flat, not palpated in skin of breast now. Patient also palpated agreed not as before. Non tender, fading color.at 7 o'clock. Left breast: normal appearance, no masses or tenderness or nipple retraction or dimpling, no nipple discharge or bleeding. No axillary or supraclavicular adenopathy. Assessment:   ASSESSMENT:Patient is diagnosed with normal breast exam  History of papule with exudate resolving/resolved. Screening mammogram acceptable, unless otherwise noted on mammogram   Plan:   Patient has mammogram scheduled for 09/27/2018. Diagnostic mammogram and [place] only if indicated by mammogram. Questions addressed at length. Keep appointment.  Rv prn

## 2018-09-21 NOTE — Telephone Encounter (Signed)
Call to patient. Patient states she has noticed a "protruding bump" on her right breast for the last 3 weeks. Thought it maybe looked like a zit, but it is not going away. Patient reports it is now turning white almost like it is healing. Denies rash or fever. States it started off with tenderness, but the tenderness has improved. Patient is scheduled for MMG on 09-27-2018. OV recommended. Reviewed with Debbi. Patient scheduled for 09-21-2018 at 1630. Patient agreeable to date and time of appointment.   Routing to provider and will close encounter.

## 2018-09-22 ENCOUNTER — Telehealth: Payer: Self-pay | Admitting: *Deleted

## 2018-09-22 NOTE — Telephone Encounter (Signed)
-----   Message from Regina Eck, CNM sent at 09/21/2018  9:53 PM EDT ----- OK for mammogram, do feel diagnostic needed unless change noted at mammogram andWould be acceptable to do.

## 2018-09-22 NOTE — Telephone Encounter (Signed)
Spoke with Benjamine Mola at [facility]. Advised patient was seen in office on 9/16 for breast exam, ok to proceed with screening MMG as scheduled on 09/27/18.   Routing to [employer], CNM FYI.   Encounter closed.

## 2018-09-27 ENCOUNTER — Other Ambulatory Visit: Payer: Self-pay

## 2018-09-27 ENCOUNTER — Ambulatory Visit
Admission: RE | Admit: 2018-09-27 | Discharge: 2018-09-27 | Disposition: A | Discharge status: Home / Self Care | Payer: PRIVATE HEALTH INSURANCE | Source: Ambulatory Visit | Attending: Certified Nurse Midwife | Admitting: Certified Nurse Midwife

## 2018-09-27 DIAGNOSIS — Z1231 Encounter for screening mammogram for malignant neoplasm of breast: Secondary | ICD-10-CM

## 2018-10-07 ENCOUNTER — Ambulatory Visit: Payer: PRIVATE HEALTH INSURANCE

## 2018-11-01 ENCOUNTER — Other Ambulatory Visit: Payer: Self-pay | Admitting: Certified Nurse Midwife

## 2018-11-01 DIAGNOSIS — N951 Menopausal and female climacteric states: Secondary | ICD-10-CM

## 2018-11-01 DIAGNOSIS — Z79890 Hormone replacement therapy (postmenopausal): Secondary | ICD-10-CM

## 2018-11-01 NOTE — Telephone Encounter (Signed)
Medication refill request: Vivelle-Dot Last AEX:  07/04/2018 DL Next AEX: 07/12/2019 Last MMG (if hormonal medication request): 09/27/2018 BIRADS 1 Negative Density A Refill authorized: Pending #24 with 2 refills if appropriate. Please advise.

## 2019-01-25 ENCOUNTER — Other Ambulatory Visit: Payer: Self-pay | Admitting: Certified Nurse Midwife

## 2019-01-25 ENCOUNTER — Telehealth: Payer: Self-pay

## 2019-01-25 ENCOUNTER — Encounter: Payer: Self-pay | Admitting: Family Medicine

## 2019-01-25 ENCOUNTER — Ambulatory Visit (INDEPENDENT_AMBULATORY_CARE_PROVIDER_SITE_OTHER): Payer: PRIVATE HEALTH INSURANCE | Admitting: Family Medicine

## 2019-01-25 DIAGNOSIS — Z79890 Hormone replacement therapy (postmenopausal): Secondary | ICD-10-CM

## 2019-01-25 DIAGNOSIS — B349 Viral infection, unspecified: Secondary | ICD-10-CM | POA: Diagnosis not present

## 2019-01-25 DIAGNOSIS — N951 Menopausal and female climacteric states: Secondary | ICD-10-CM

## 2019-01-25 MED ORDER — PREDNISONE 10 MG PO TABS: ORAL_TABLET | ORAL | 0 refills | Status: DC

## 2019-01-25 MED ORDER — ALBUTEROL SULFATE HFA 108 (90 BASE) MCG/ACT IN AERS: 2.0000 | INHALATION_SPRAY | RESPIRATORY_TRACT | 1 refills | Status: DC | PRN

## 2019-01-25 NOTE — Patient Instructions (Addendum)
Take zofran if needed for nausea  Drink fluids to avoid dehydration  Tylenol (acetaminophen) every 4-6 hours for pain and fever and chills  Rest and isolate until you get a test result and call us with that result   Use albuterol inhaler if needed for chest tightness or wheezing If that progresses- then fill the px for prednisone and take as directed   If symptoms suddenly worsen go to the ER or urgent care  If less severe- call us to get a respiratory clinic appointment

## 2019-01-25 NOTE — Assessment & Plan Note (Signed)
Worsening symptoms over past 24 hours- resp and GI and fever  Strongly suspect covid 19- pending result of test -inst pt to call  Intermittent chest tightness and feeling of sob  Px prednisone (at pt req) to hold and take if she begins to wheeze /increase in chest tightness  Albuterol mdi also refilled  Enc fluids/ (disc s/s of dehydration to watch for) zofran for nausea prn  Tylenol for fever/pain/headache  inst to go to UC or ER if worse after hours  Also disc opt of respiratory clinic if needed later in the week

## 2019-01-25 NOTE — Telephone Encounter (Signed)
I will see her then  

## 2019-01-25 NOTE — Telephone Encounter (Signed)
Pt started with symptoms on 01/23/19; pt was tested for covid earlier today; pt has temp now 100.4 after taking tylenol 3 hrs ago; face is red, eyes burning,chills, body aches, S/T, H/A sharp pain on and off pain level 8. Back pain, head and chest congestion, dry cough,SOB with exertion x 1 on 01/24/19. Pt speaking in full sentences now. Waves of nausea but no vomiting. Lower abd pain on and off and much better today. Diarrhea , hemorrhoids has uses ointment and suppository. Dry mouth and pt is exhausted. Pt said she thinks urine color is darker than normal. Pt does not want to go to UC for eval. Pt said new symptoms developing hourly. Pt scheduled virtual appt today with Dr Glori Bickers 01/25/19 at 2:30 pm. Pt will have vitals ready when CMA calls. UC & ED precautions given and pt voiced understanding.
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
## 2019-01-26 NOTE — Telephone Encounter (Signed)
Thanks for the update

## 2019-01-26 NOTE — Telephone Encounter (Signed)
Medication refill request: estradiol patch #24, 1R Last AEX:  07-04-18 Next AEX: 07-12-19 Last MMG (if hormonal medication request): 09-27-18 Neg/[id number] Refill authorized: Please refill if appropriate

## 2019-01-26 NOTE — Telephone Encounter (Signed)
Pt called back today with condition slightly different;earlier this morning pt felt better, at lunch time pt began chilling, no fever and pt had taken Tylenol at 11 AM. Pt had a H/A earlier this morning but Tylenol helped that. Pt said she only had few times of dry cough on 01/25/19. The dry cough has increased today but still not a lot of coughing. When pt was coughing on phone pts cough was deeper than has been. Pt said since lunch she does not have chest tightness or wheezing but she does have chest pressure,chest heaviness and a sensation in chest that pt cannot describe. Pt said the pressure, heaviness is not severe but pt has felt it and continues to be aware of it. Pt does not think worsens when takes a deep breath. Pt is afraid that there may be infection going into her lungs;pt said something is changing. Pt had pneumonia x 1 previously. Since pt is not have chest tightness or wheezing pt has not used the albuterol inhaler but wonders if should begin taking the prednisone for the chest pressure,heaviness and and unexplainable sensation in chest. Dr Glori Bickers and Dr Damita Dunnings is out of office this afternoon. I spoke with Dr Einar Pheasant who is in office this afternoon; Dr Einar Pheasant suggested to try the albuterol inhaler and if that helps then continue the albuterol inhaler as prescribed. Dr Einar Pheasant would not recommend starting the prednisone. If pt continues with chest heaviness, pressure and unexplained sensation in chest to consider going to ED for eval. Pt voiced understanding with Dr Verda Cumins instructions; pt is very fearful of taking medications but is more fearful of going to UC or ED. Pt said on 01/21/19  Pt went to [facility] [employer] and she touched the key pad with one finger and cannot remember if she hand sanitized her hands. Pt wonders if she has covid if that could have been when she contacted it. Pt said she has tried to be so careful. And pt is concerned that it may take 3-5 days to get [facility]id results [facility]. I  advised pt that is basically standard for [facility] cov[facility] resulting. Pt tried the albuterol inhaler with 2 puffs while I was on the phone and after few mins. Pt said her chest did feel better and she could take a deeper breath now. Pt also said that [facility]25[facility]19. Pt said she will continue the albuterol inhaler as prescribed but if she feels the albuterol inhaler is not working as well then pt will start the prednisone per Dr Marliss Coots instruction when had virtual visit on 01/25/19. Pt understands if her symptoms worsen suddenly then pt will go to [facility] or ED. Pt will call in  Early AM with update on how pt is doing. 40' on phone with pt but pt said she felt reassured after talking and was appreciative. FYI to Dr Einar Pheasant, Dr Glori Bickers and Dr Damita Dunnings as PCP. Will send note to [place] pt does not cb with update.

## 2019-01-26 NOTE — Telephone Encounter (Signed)
Agree with prn albuterol for now and all instructions given.  Thanks for helping patient.

## 2019-01-27 NOTE — Telephone Encounter (Signed)
Patient stated that her test results came back and they are negative. She wanted to know if you thought maybe this could be just a virus or flu   Please advise

## 2019-01-27 NOTE — Telephone Encounter (Signed)
Pt didn't call this morning to give an update so I called pt back. Pt said she is feeling better today she did wake up with some diarrhea and GI issues but as the day progressed it has resolved. Pt said the chest discomfort has also resolved and she hasn't had anymore since talking with Rena yesterday. Pt said she only has a small HA and a little cough but both of those sxs are not as bad as they were yesterday. Overall pt said she is feeling a lot better, she is resting and taking tylenol as needed and taking OTC vitamins and everything seems to be helping. Pt said she hasn't needed to take the prednisone at all. Pt is still waiting for her Covid results but for now she is stable   FYI to PCP and Dr. Glori Bickers

## 2019-01-27 NOTE — Telephone Encounter (Signed)
Thanks for the update I will cc PCP

## 2019-01-27 NOTE — Telephone Encounter (Signed)
Noted. Thanks.  I'll defer for now.   Glad she is feeling better.

## 2019-01-27 NOTE — Telephone Encounter (Signed)
Could be a non-covid illess.  She could have a false neg covid test.  Either way, I would still follow routine guidance given that she isn't totally well- masking/etc. Thanks.

## 2019-01-30 ENCOUNTER — Other Ambulatory Visit: Payer: Self-pay | Admitting: *Deleted

## 2019-01-30 MED ORDER — HYDROCORTISONE ACETATE 25 MG RE SUPP: RECTAL | 1 refills | Status: DC

## 2019-01-30 NOTE — Telephone Encounter (Signed)
Patient advised.

## 2019-03-27 ENCOUNTER — Encounter: Payer: Self-pay | Admitting: Certified Nurse Midwife

## 2019-03-31 ENCOUNTER — Ambulatory Visit: Payer: PRIVATE HEALTH INSURANCE | Attending: Internal Medicine

## 2019-03-31 DIAGNOSIS — Z23 Encounter for immunization: Secondary | ICD-10-CM

## 2019-03-31 NOTE — Progress Notes (Signed)
   Covid-19 Vaccination Clinic  Name:  Sri Bodiford    MRN: [id number] DOB: 1970-11-20  03/31/2019  Ms. Duffee was observed post Covid-19 immunization for 15 minutes without incident. She was provided with Vaccine Information Sheet and instruction to access the V-Safe system.   Ms. Quilantan was instructed to call 911 with any severe reactions post vaccine: Marland Kitchen Difficulty breathing  . Swelling of face and throat  . A fast heartbeat  . A bad rash all over body  . Dizziness and weakness   Immunizations Administered    Name Date Dose VIS Date Route   Moderna COVID-19 Vaccine 03/31/2019 11:36 AM 0.5 mL 12/06/2018 Intramuscular   Manufacturer: Moderna   Lot: [id number]   [place][id number]

## 2019-05-03 ENCOUNTER — Ambulatory Visit: Payer: PRIVATE HEALTH INSURANCE

## 2019-05-10 ENCOUNTER — Ambulatory Visit: Payer: PRIVATE HEALTH INSURANCE | Attending: Internal Medicine

## 2019-05-10 DIAGNOSIS — Z23 Encounter for immunization: Secondary | ICD-10-CM

## 2019-05-10 NOTE — Progress Notes (Signed)
   Covid-19 Vaccination Clinic  Name:  Hannah [name]    MRN: [id number] DOB: August 18, 1970  05/10/2019  [name] was observed post Covid-19 immunization for 15 minutes without incident. She was provided with Vaccine Information Sheet and instruction to access the V-Safe system.   [name] was instructed to call 911 with any severe reactions post vaccine: Marland Kitchen Difficulty breathing  . Swelling of face and throat  . A fast heartbeat  . A bad rash all over body  . Dizziness and weakness   Immunizations Administered    Name Date Dose VIS Date Route   Moderna COVID-19 Vaccine 05/10/2019 10:58 AM 0.5 mL 12/2018 Intramuscular   Manufacturer: Moderna   Lot: [id number]   [place]:[id number]

## 2019-05-11 ENCOUNTER — Telehealth: Payer: Self-pay

## 2019-05-11 NOTE — Telephone Encounter (Signed)
[facility] Night - Client TELEPHONE ADVICE RECORD [facility] Patient Name: Karmah Avis Gender: Female DOB: [birth year] Age: 49 Y 62 M 27 D Return Phone Number: [phone] (Primary) [place] [address] Client [place] Night - Client Client Site [place] Physician Renford Dills - MD Contact Type Call Who Is Calling Patient / Member / Family / Caregiver Call Type Triage / Clinical Relationship To Patient Self Return Phone Number [phone] (Primary) Chief Complaint Weakness, Generalized Reason for Call Symptomatic / Request for Health Information Initial Comment Caller received covid shot yesterday. Caller wants to know if she can take advil for her body aches or something for the nausea. She also has nausea and fever. Translation No Nurse Assessment Nurse: Delafuente, RN, Irma Date/Time (Eastern Time): 05/11/2019 6:46:34 AM Confirm and document reason for call. If symptomatic, describe symptoms. ---Caller received 2nd COVID-19 Moderna vaccine yesterday. Caller reports she has a fever, body aches and nausea. Has the patient had close contact with a person known or suspected to have the novel coronavirus illness OR traveled / lives in area with major community spread (including international travel) in the last 14 days from the onset of symptoms? * If Asymptomatic, screen for exposure and travel within the last 14 days. ---No Does the patient have any new or worsening symptoms? ---Yes Will a triage be completed? ---Yes Related visit to physician within the last 2 weeks? ---No Does the PT have any chronic conditions? (i.e. diabetes, asthma, this includes High risk factors for pregnancy, etc.) ---No Is the patient pregnant or possibly pregnant? (Ask all females between the ages of 29-55) ---No Is this a behavioral health or substance abuse call?  ---No Guidelines Guideline Title Affirmed Question Affirmed Notes Nurse Date/Time (Eastern Time) COVID-19 - Vaccine Questions and Reactions COVID-19 vaccine, systemic reactions (e.g., fatigue, fever, muscle aches), questions about Delafuente, RN, Irma 05/11/2019 6:48:28 AM Disp. Time Eilene Ghazi Time) Disposition Final User PLEASE NOTE: All timestamps contained within this report are represented as [place] Standard Time. CONFIDENTIALTY NOTICE: This fax transmission is intended only for the addressee. It contains information that is legally privileged, confidential or otherwise protected from use or disclosure. If you are not the intended recipient, you are strictly prohibited from reviewing, disclosing, copying using or disseminating any of this information or taking any action in reliance on or regarding this information. If you have received this fax in error, please notify us immediately by telephone so that we can arrange for its return to [place]. Phone: [phone], Toll-Free: [phone], Fax: [phone] Page: 2 of 2 Call Id: [id number] 05/11/2019 6:51:58 AM Home Care Yes Delafuente, RN, Benay Spice Caller Disagree/Comply Comply Caller Understands Yes PreDisposition Did not know what to do Care Advice Given Per Guideline HOME CARE: * You should be able to treat this at home. COVID-19 VACCINE - COMMON REACTIONS: * Local pain, redness, or swelling at injection site * Feeling tired (fatigue) * Fever and chills * Headache * Muscle aches or joint pains * Symptoms usually last 1 to 2 days. COLD PACK FOR LOCAL REACTION AT INJECTION SITE: * Apply a cold pack or ice in a wet washcloth to the area for 20 minutes. Repeat in 1 hour. * Then apply as needed for the first 48 hours after the injection. (Reason: reduce the pain and swelling.) * Symptoms: Symptoms of an anaphylactic reaction include breathing difficulty, dizziness, face and throat swelling, fast heart beating, rash all over the body, and  weakness.  COVID-19 VACCINE - RARE REACTIONS: * Rarely, a SEVERE ALLERGIC REACTION (anaphylactic reaction) of the body's immune system may occur after a COVID-19 vaccination. This is a severe and sometimes life-threatening overreaction (allergic reaction) of the body's immune system. PAIN AND FEVER MEDICINES: * For pain or fever relief, take either acetaminophen or ibuprofen. * They are over-the-counter (OTC) drugs that help treat both fever and pain. You can buy them at the drugstore. * ACETAMINOPHEN REGULAR STRENGTH TYLENOL: Take 650 mg (two 325 mg pills) by mouth every 4-6 hours as needed. Each Regular Strength Tylenol pill has 325 mg of acetaminophen. The most you should take each day is 3,250 mg (10 pills a day). CALL BACK IF: * Fever lasts over 3 days * Pain at injection site not improving after 3 days * Swollen lymph node lasts over 3 weeks * You become worse.

## 2019-05-12 NOTE — Telephone Encounter (Signed)
Patient advised.

## 2019-05-12 NOTE — Telephone Encounter (Signed)
Taking advil with food is reasonable.  Continue po fluids.  Update [place] as needed.  Should resolve.  Thanks.

## 2019-07-12 ENCOUNTER — Ambulatory Visit: Payer: PRIVATE HEALTH INSURANCE | Admitting: Certified Nurse Midwife

## 2019-08-07 ENCOUNTER — Telehealth: Payer: Self-pay

## 2019-08-07 ENCOUNTER — Encounter: Payer: Self-pay | Admitting: Family Medicine

## 2019-08-07 ENCOUNTER — Ambulatory Visit (INDEPENDENT_AMBULATORY_CARE_PROVIDER_SITE_OTHER): Payer: PRIVATE HEALTH INSURANCE | Admitting: Family Medicine

## 2019-08-07 DIAGNOSIS — J329 Chronic sinusitis, unspecified: Secondary | ICD-10-CM

## 2019-08-07 MED ORDER — AMOXICILLIN-POT CLAVULANATE 875-125 MG PO TABS: 1.0000 | ORAL_TABLET | Freq: Two times a day (BID) | ORAL | 0 refills | Status: DC

## 2019-08-07 MED ORDER — ONDANSETRON HCL 4 MG PO TABS: 4.0000 mg | ORAL_TABLET | Freq: Three times a day (TID) | ORAL | 1 refills | Status: DC | PRN

## 2019-08-07 MED ORDER — PROMETHAZINE HCL 25 MG PO TABS: 25.0000 mg | ORAL_TABLET | Freq: Three times a day (TID) | ORAL | 1 refills | Status: AC | PRN

## 2019-08-07 NOTE — Telephone Encounter (Signed)
[facility] Night - Client TELEPHONE ADVICE RECORD [facility] Patient Name: Hannah [name] Gender: Female DOB: [birth year] Age: 49 Y 54 M 22 D Return Phone Number: [phone] (Primary) Address[place] [address] Client [facility] Night - Client Client Site [place] Physician Renford Dills - MD Contact Type Call Who Is Calling Patient / Member / Family / Caregiver Call Type Triage / Clinical Relationship To Patient Self Return Phone Number [phone] (Primary) Chief Complaint Dizziness Reason for Call Symptomatic / Request for [place] woke up dizzy; the room is spinning. She has nausea and headache. Plus her right ear is stopped up. Translation No Nurse Assessment Nurse: Nila Nephew, RN, Claudette Date/Time (Eastern Time): 08/05/2019 8:25:30 AM Confirm and document reason for call. If symptomatic, describe symptoms. ---Caller states she has been having sinus pain and right ear fullness for a few days. This morning she woke up with head pressure, dizziness (like the room is spinning), and nausea. No fever. Has the patient had close contact with a person known or suspected to have the novel coronavirus illness OR traveled / lives in area with major community spread (including international travel) in the last 14 days from the onset of symptoms? * If Asymptomatic, screen for exposure and travel within the last 14 days. ---No Does the patient have any new or worsening symptoms? ---Yes Will a triage be completed? ---Yes Related visit to physician within the last 2 weeks? ---No Does the PT have any chronic conditions? (i.e. diabetes, asthma, this includes High risk factors for pregnancy, etc.) ---No Is the patient pregnant or possibly pregnant? (Ask all females between the ages of 48-55) ---No Is this a behavioral health or substance abuse call?  ---No Guidelines Guideline Title Affirmed Question Affirmed Notes Nurse Date/Time (Eastern Time) Dizziness - Vertigo SEVERE dizziness (vertigo) (e.g., unable to walk without assistance) Nila Nephew, Therapist, sports, Claudette 08/05/2019 8:27:51 AM Disp. Time (Eastern Time) Disposition Final UserPLEASE NOTE: All timestamps contained within this report are represented as [place] Standard Time. CONFIDENTIALTY NOTICE: This fax transmission is intended only for the addressee. It contains information that is legally privileged, confidential or otherwise protected from use or disclosure. If you are not the intended recipient, you are strictly prohibited from reviewing, disclosing, copying using or disseminating any of this information or taking any action in reliance on or regarding this information. If you have received this fax in error, please notify us immediately by telephone so that we can arrange for its return to [place]. Phone: [phone], Toll-Free: [phone], Fax: [phone] Page: 2 of 2 Call Id: [id number] 08/05/2019 8:32:41 AM Go to ED Now (or PCP triage) Isidore Moos, RN, Claudette Caller Disagree/Comply Comply Caller Understands Yes PreDisposition Call Doctor Care Advice Given Per Guideline GO TO ED NOW (OR PCP TRIAGE): * IF NO PCP (PRIMARY CARE PROVIDER) SECOND-LEVEL TRIAGE: You need to be seen within the next hour. Go to the [facility] at _____________ [facility] as soon as you can. CARE ADVICE given per Dizziness - Vertigo (Adult) guideline. Referrals GO TO FACILITY UNDECIDED

## 2019-08-07 NOTE — Telephone Encounter (Signed)
See OV note.  

## 2019-08-07 NOTE — Progress Notes (Signed)
This visit occurred during the SARS-CoV-2 public health emergency.  Safety protocols were in place, including screening questions prior to the visit, additional usage of staff PPE, and extensive cleaning of exam room while observing appropriate contact time as indicated for disinfecting solutions.  HA started last week.  R sided HA.  Then about 4 days ago she had more sinus pressure.  Started taking claritin in the meantime.  R ear felt stopped up.  Then 2 days ago with vertigo when she got out of the bed.  Room spinning at that point.  She got some pseudophed and took a zofran.  That helped some.  Had persistent nausea.  She has FH vertigo.  She didn't have numbness or weakness.  No fevers.  Some rhinorrhea.  No sputum.  No cough.  No focal neuro changes in the extremities.  R maxillary sinus pressure and upper tooth pain noted.   She used some cipro drops in the R ear in the meantime.  Ear pain is better.  Some nausea but vertigo is gradually better.    Meds, vitals, and allergies reviewed.   ROS: Per HPI unless specifically indicated in ROS section   GEN: nad, alert and oriented HEENT: mucous membranes moist, TM wnl B with old chronic changes R TM.  R max sinus ttp.  L max sinus not ttp.   NECK: supple w/o LA CV: rrr.   PULM: ctab, no inc wob ABD: soft, +bs EXT: no edema CN 2-12 wnl B, S/S wnl x4 DHP neg.

## 2019-08-07 NOTE — Patient Instructions (Signed)
Start augmentin, uses zofran/phenergan, work on the bedside exercises.  Rest and fluids. Update me as needed.  Take care.  Glad to see you.

## 2019-08-09 NOTE — Assessment & Plan Note (Signed)
Nontoxic.  Okay for outpatient follow-up.  With episodic vertigo Start augmentin, uses zofran/phenergan, and she will work on the bedside exercises for vertigo.  Rest and fluids. Update me as needed.  She agrees with plan.

## 2019-08-11 ENCOUNTER — Telehealth: Payer: Self-pay

## 2019-08-11 NOTE — Telephone Encounter (Signed)
Patient contacted the office and states that she was seen in the office on 8/2 for and ear infection and a sinus infection - and that she was given Augmentin. She states that this medication is really messing her stomach up and causing diarrhea. She states she is going on vacation on Tuesday, and she needs an abx that will be better for her stomach. Please advise.

## 2019-08-11 NOTE — Telephone Encounter (Signed)
Patient called in checking in on anabiotic being changed. Please advise.

## 2019-08-13 MED ORDER — DOXYCYCLINE HYCLATE 100 MG PO TABS: 100.0000 mg | ORAL_TABLET | Freq: Two times a day (BID) | ORAL | 0 refills | Status: DC

## 2019-08-13 NOTE — Telephone Encounter (Signed)
I was out of clinic when then message came in.  Would change to doxycycline.  rx sent.  Please check with patient.  Thanks.

## 2019-08-13 NOTE — Addendum Note (Signed)
Addended by: Tonia Ghent on: 08/13/2019 05:27 PM   Modules accepted: Orders

## 2019-08-14 NOTE — Telephone Encounter (Signed)
Patient advised.

## 2019-08-14 NOTE — Telephone Encounter (Signed)
Patient asks if she can be out in the sun on Doxycycline?

## 2019-08-14 NOTE — Telephone Encounter (Signed)
Would use sunscreen.

## 2019-09-07 ENCOUNTER — Other Ambulatory Visit: Payer: Self-pay

## 2019-09-07 ENCOUNTER — Encounter: Payer: Self-pay | Admitting: Family Medicine

## 2019-09-07 ENCOUNTER — Ambulatory Visit (INDEPENDENT_AMBULATORY_CARE_PROVIDER_SITE_OTHER): Payer: PRIVATE HEALTH INSURANCE | Admitting: Family Medicine

## 2019-09-07 ENCOUNTER — Telehealth: Payer: Self-pay | Admitting: Family Medicine

## 2019-09-07 DIAGNOSIS — H61899 Other specified disorders of external ear, unspecified ear: Secondary | ICD-10-CM | POA: Diagnosis not present

## 2019-09-07 DIAGNOSIS — R21 Rash and other nonspecific skin eruption: Secondary | ICD-10-CM

## 2019-09-07 MED ORDER — BUTALBITAL-ASPIRIN-CAFFEINE 50-325-40 MG PO CAPS: 1.0000 | ORAL_CAPSULE | Freq: Two times a day (BID) | ORAL | 1 refills | Status: AC | PRN

## 2019-09-07 MED ORDER — VALACYCLOVIR HCL 1 G PO TABS: 1000.0000 mg | ORAL_TABLET | Freq: Three times a day (TID) | ORAL | 0 refills | Status: DC

## 2019-09-07 MED ORDER — NEOMYCIN-POLYMYXIN-HC 3.5-10000-1 OT SOLN: 3.0000 [drp] | Freq: Four times a day (QID) | OTIC | 0 refills | Status: DC

## 2019-09-07 MED ORDER — GABAPENTIN 100 MG PO CAPS: 100.0000 mg | ORAL_CAPSULE | Freq: Three times a day (TID) | ORAL | 1 refills | Status: DC

## 2019-09-07 NOTE — Telephone Encounter (Signed)
Pt aware 4:30 appointment 9/2

## 2019-09-07 NOTE — Telephone Encounter (Signed)
Can you get her added on the schedule today at 4:30 per Dr. Damita Dunnings?

## 2019-09-07 NOTE — Telephone Encounter (Signed)
Pt called thinks she has shingles on her back. Blister and tingling She lives 6 mins from here

## 2019-09-07 NOTE — Progress Notes (Signed)
This visit occurred during the SARS-CoV-2 public health emergency.  Safety protocols were in place, including screening questions prior to the visit, additional usage of staff PPE, and extensive cleaning of exam room while observing appropriate contact time as indicated for disinfecting solutions.  Her ear sx are some better.  She had some water in R ear, used peroxide recently.    Rash recently noted.  Itchy spot on her back.  Locally painful.  Tingling locally.  Doesn't cross the midline.  Minimal rash on the R side of back.  Aleve isn't helping.  Sx started in the last few days.    She has used fiorinal prn, rarely, for HA.  No ADE on med. Used prn.  Needed refill.    Meds, vitals, and allergies reviewed.   ROS: Per HPI unless specifically indicated in ROS section   nad ncat Minimal irritation on the R ear canal.   TM wnl B o/w.  No TM erythema Neck supple, no LA.  rrr ctab Minimal rash on the R side of the back with local change in sensation noted.  Does not cross midline.

## 2019-09-07 NOTE — Patient Instructions (Addendum)
Presumed shingles.   Start valtrex and use gabapentin as needed.    Use cortisporin in you ear if needed.   Take care.  Glad to see you.

## 2019-09-11 DIAGNOSIS — R21 Rash and other nonspecific skin eruption: Secondary | ICD-10-CM | POA: Insufficient documentation

## 2019-09-11 DIAGNOSIS — H61899 Other specified disorders of external ear, unspecified ear: Secondary | ICD-10-CM | POA: Insufficient documentation

## 2019-09-11 NOTE — Assessment & Plan Note (Signed)
Tender not tender.  No tympanic membrane erythema Can use Cortisporin and then update [place] as needed.  She agrees.

## 2019-09-11 NOTE — Assessment & Plan Note (Signed)
Presumed shingles.   Start valtrex and use gabapentin as needed.   Routine cautions and pathophysiology discussed with patient.  She will update [place] as needed.  She agrees.

## 2019-10-24 DIAGNOSIS — D2272 Melanocytic nevi of left lower limb, including hip: Secondary | ICD-10-CM | POA: Diagnosis not present

## 2019-10-24 DIAGNOSIS — D2262 Melanocytic nevi of left upper limb, including shoulder: Secondary | ICD-10-CM | POA: Diagnosis not present

## 2019-10-24 DIAGNOSIS — D225 Melanocytic nevi of trunk: Secondary | ICD-10-CM | POA: Diagnosis not present

## 2019-10-24 DIAGNOSIS — D2261 Melanocytic nevi of right upper limb, including shoulder: Secondary | ICD-10-CM | POA: Diagnosis not present

## 2019-11-15 ENCOUNTER — Telehealth: Payer: Self-pay | Admitting: Family Medicine

## 2019-11-15 NOTE — Telephone Encounter (Signed)
Jarrett Soho called in from lawyer office wanted to set up an appointment for with the attorney Wende Bushy for treatment of the Pt [facility]   Call back number [phone]

## 2019-11-15 NOTE — Telephone Encounter (Signed)
Please verify this with the patient and see what details she can provide.  We should not release information to a third-party without the express written permission of the patient.  Thanks.

## 2019-11-16 NOTE — Telephone Encounter (Signed)
Dr. Damita Dunnings,   The attorney's office must present [place] with a signed, legal release for [place] to discuss medical care and treatment of the patient with them.  Verbal consent by phone from the patient is not sufficient.   I have left a message for Jarrett Soho with the details regarding this and that she can call me if she has any further questions.  I did not leave any PHI on the voicemail regarding who the patient was only general info.     Will wait to see if we hear or receive anything from their office before we can move forward.   Thanks.

## 2019-11-16 NOTE — Telephone Encounter (Signed)
Thanks. Noted.

## 2019-11-21 NOTE — Telephone Encounter (Signed)
Noted. Thanks.

## 2019-11-21 NOTE — Telephone Encounter (Signed)
Hannah [name] from the law office called back to inform us that they no longer need to schedule the "deposition" with Dr. Damita Dunnings.  I informed her that we had not received any formal, legal subpoena or deposition request and am not sure what this is pertaining to.  Hannah [name] states that the claim has already been settled and there is nothing needed moving forward and didn't disclose any further details.  She did state that they have not sent out anything formal to Dr. Damita Dunnings and will not be contacting further as this case is settled.   FYI to Dr. Damita Dunnings.

## 2020-01-01 ENCOUNTER — Telehealth: Payer: Self-pay

## 2020-01-01 MED ORDER — LIDOCAINE 5 % EX PTCH: MEDICATED_PATCH | CUTANEOUS | 1 refills | Status: DC

## 2020-01-01 NOTE — Telephone Encounter (Signed)
I sent the prescription with the presumption that she is referencing the lidocaine patch.  Please verify this and get relevant details about pain in her situation.  Thanks.

## 2020-01-01 NOTE — Addendum Note (Signed)
Addended by: Joaquim Nam on: 01/01/2020 05:07 PM   Modules accepted: Orders

## 2020-01-01 NOTE — Telephone Encounter (Signed)
Requesting Lidocaine.  Please advise

## 2020-01-02 NOTE — Telephone Encounter (Signed)
Noted. Thanks.

## 2020-01-02 NOTE — Telephone Encounter (Signed)
I spoke to pt and she said yes it was the lidoderm patch she needed. She was experiencing left hip pain.

## 2020-01-06 HISTORY — PX: COLONOSCOPY: SHX174

## 2020-02-06 ENCOUNTER — Encounter: Payer: Self-pay | Admitting: Gastroenterology

## 2020-02-14 ENCOUNTER — Other Ambulatory Visit: Payer: Self-pay | Admitting: Certified Nurse Midwife

## 2020-02-14 ENCOUNTER — Telehealth: Payer: Self-pay | Admitting: *Deleted

## 2020-02-14 ENCOUNTER — Other Ambulatory Visit: Payer: Self-pay | Admitting: Nurse Practitioner

## 2020-02-14 DIAGNOSIS — N951 Menopausal and female climacteric states: Secondary | ICD-10-CM

## 2020-02-14 DIAGNOSIS — Z1231 Encounter for screening mammogram for malignant neoplasm of breast: Secondary | ICD-10-CM

## 2020-02-14 DIAGNOSIS — Z79890 Hormone replacement therapy (postmenopausal): Secondary | ICD-10-CM

## 2020-02-14 MED ORDER — ESTRADIOL 0.05 MG/24HR TD PTTW: 1.0000 | MEDICATED_PATCH | TRANSDERMAL | 0 refills | Status: DC

## 2020-02-14 NOTE — Telephone Encounter (Signed)
Rx sent. Patient aware.  

## 2020-02-14 NOTE — Telephone Encounter (Signed)
Patient has annual exam scheduled on 02/20/20 with you, needs refill on vivelle dot patch 0.05mg . okay to send Rx? Former Hollice Espy, [place] patient

## 2020-02-19 NOTE — Progress Notes (Deleted)
50 y.o. G54P1001 Married White or Caucasian female here for annual exam.      No LMP recorded. Patient has had a hysterectomy.          Sexually active: {yes no:314532}  The current method of family planning is hysterectomy.    Exercising: {yes no:314532}  {types:19826} Smoker:  {YES P5382123  Health Maintenance: Pap:  05-27-17 neg, 07-04-2018 neg HPV HR neg History of abnormal Pap:  no MMG:  09-27-2018 category a density birads 1:neg Colonoscopy:  none BMD:   none TDaP:  2017 Gardasil:   n/a Covid-19: moderna Hep C testing: *** Screening Labs: ***   reports that she has never smoked. She has never used smokeless tobacco. She reports that she does not drink alcohol and does not use drugs.  Past Medical History:  Diagnosis Date  . Calculus of kidney   . Chronic headaches   . Gross hematuria   . IC (interstitial cystitis)    per Dr. Jacqlyn Larsen, s/p botox injection  . Neoplasm of uncertain behavior of bladder   . Other chronic cystitis   . Other symptoms involving urinary system(788.99)     Past Surgical History:  Procedure Laterality Date  . ABDOMINAL HYSTERECTOMY  10/1999  . APPENDECTOMY    . BILATERAL SALPINGOOPHORECTOMY    . BOTOX INJECTION     in bladder  . CESAREAN SECTION    . CHOLECYSTECTOMY  10/23/2005   Lap  . CT ABD W & PELVIS WO CM  10/09   Thickening and irreg dome of bladder.  Diverticulosis  . Cystoscopy/bladder Bx  11/03/07   Path...inflammation.  Hydrodistention Excoriations (Dr. Jacqlyn Larsen)   . CYSTOSTOMY W/ BLADDER BIOPSY    . LAPAROSCOPIC APPENDECTOMY  10/2013   [facility]  . LAPAROSCOPY ABDOMEN DIAGNOSTIC  07/1999   exploration adhesions to ovaries  . LUMBAR SPINE SURGERY    . Physical Therapy  12/1997; 01/06/1998; 03/1998    Current Outpatient Medications  Medication Sig Dispense Refill  . albuterol (VENTOLIN HFA) 108 (90 Base) MCG/ACT inhaler Inhale 2 puffs into the lungs every 4 (four) hours as needed for wheezing. 18 g 1  . ALPRAZolam (XANAX) 0.5 MG tablet  TAKE 1 TABLET BY MOUTH TWICE A DAY AS NEEDED FOR ANXIETY 60 tablet 5  . butalbital-aspirin-caffeine (FIORINAL) 50-325-40 MG capsule Take 1 capsule by mouth 2 (two) times daily as needed for headache. 30 capsule 1  . cyclobenzaprine (FLEXERIL) 10 MG tablet Take 0.5-1 tablets (5-10 mg total) by mouth 3 (three) times daily as needed. 30 tablet 1  . doxycycline (VIBRA-TABS) 100 MG tablet Take 1 tablet (100 mg total) by mouth 2 (two) times daily. 20 tablet 0  . estradiol (VIVELLE-DOT) 0.05 MG/24HR patch Place 1 patch (0.05 mg total) onto the skin 2 (two) times a week. 8 patch 0  . gabapentin (NEURONTIN) 100 MG capsule Take 1-2 capsules (100-200 mg total) by mouth 3 (three) times daily. 40 capsule 1  . hydrocortisone (ANUSOL-HC) 25 MG suppository PLACE 1 SUPPOSITORY RECTALLY TWICE A DAY AS NEEDED FOR HEMORRHOIDS 12 suppository 1  . ibuprofen (ADVIL,MOTRIN) 600 MG tablet Take 1 tablet (600 mg total) by mouth every 6 (six) hours as needed. 30 tablet 0  . lidocaine (LIDODERM) 5 % Remove & Discard patch within 12 hours or as directed by MD 30 patch 1  . neomycin-polymyxin-hydrocortisone (CORTISPORIN) OTIC solution Place 3 drops into the right ear 4 (four) times daily. 10 mL 0  . nitrofurantoin (MACRODANTIN) 100 MG capsule Take by mouth as  needed.    . ondansetron (ZOFRAN) 4 MG tablet Take 1 tablet (4 mg total) by mouth every 8 (eight) hours as needed for nausea or vomiting. 20 tablet 1  . pramoxine-hydrocortisone (PROCTOCREAM-HC) 1-1 % rectal cream Place 1 application rectally 2 (two) times daily as needed for hemorrhoids or anal itching. 30 g 1  . promethazine (PHENERGAN) 25 MG tablet Take 1 tablet (25 mg total) by mouth every 8 (eight) hours as needed for nausea or vomiting. Sedation caution 20 tablet 1  . valACYclovir (VALTREX) 1000 MG tablet Take 1 tablet (1,000 mg total) by mouth 3 (three) times daily. 21 tablet 0  . zolmitriptan (ZOMIG) 5 MG tablet Take 0.5-1 tablets (2.5-5 mg total) by mouth as needed  for migraine. May repeat in 2 hours if not resolved.  Note:  Limit of 2 in 24 hours. 10 tablet 1   No current facility-administered medications for this visit.    Family History  Problem Relation Age of Onset  . Colon polyps Mother        mother with polyps before age 90  . Heart attack Mother   . Diabetes Father   . Diabetes Paternal Grandmother   . Cancer Neg Hx     Review of Systems  Exam:   There were no vitals taken for this visit.     General appearance: alert, cooperative and appears stated age, no acute distress Head: Normocephalic, without obvious abnormality Neck: no adenopathy, thyroid {EXAM; THYROID:18604} Lungs: clear to auscultation bilaterally Breasts: {Exam; breast:13139::"normal appearance, no masses or tenderness"} Heart: regular rate and rhythm Abdomen: soft, non-tender; no masses,  no organomegaly Extremities: extremities normal, no edema Skin: No rashes or lesions Lymph nodes: Cervical, supraclavicular, and axillary nodes normal. No abnormal inguinal nodes palpated Neurologic: Grossly normal   Pelvic: External genitalia:  no lesions              Urethra:  normal appearing urethra with no masses, tenderness or lesions              Bartholins and Skenes: normal                 Vagina: normal appearing vagina, appropriate for age, normal appearing discharge, no lesions              Cervix: neg cervical motion tenderness, no visible lesions             Bimanual Exam:   Uterus:  {exam; uterus:12215}              Adnexa: {exam; adnexa:12223}                 ***, CMA Chaperone was present for exam.  A:  Well Woman with normal exam  P:   Pap :  Mammogram:  Labs:  Medications:

## 2020-02-20 ENCOUNTER — Encounter: Payer: Self-pay | Admitting: Nurse Practitioner

## 2020-02-20 ENCOUNTER — Ambulatory Visit: Payer: PRIVATE HEALTH INSURANCE | Admitting: Nurse Practitioner

## 2020-02-20 ENCOUNTER — Ambulatory Visit (INDEPENDENT_AMBULATORY_CARE_PROVIDER_SITE_OTHER): Payer: PRIVATE HEALTH INSURANCE | Admitting: Nurse Practitioner

## 2020-02-20 ENCOUNTER — Other Ambulatory Visit: Payer: Self-pay

## 2020-02-20 VITALS — BP 114/70 | HR 68 | Temp 99.0°F | Resp 16 | Ht 66.75 in | Wt 196.0 lb

## 2020-02-20 DIAGNOSIS — N951 Menopausal and female climacteric states: Secondary | ICD-10-CM | POA: Diagnosis not present

## 2020-02-20 DIAGNOSIS — R3129 Other microscopic hematuria: Secondary | ICD-10-CM

## 2020-02-20 DIAGNOSIS — Z01419 Encounter for gynecological examination (general) (routine) without abnormal findings: Secondary | ICD-10-CM | POA: Diagnosis not present

## 2020-02-20 DIAGNOSIS — R309 Painful micturition, unspecified: Secondary | ICD-10-CM | POA: Diagnosis not present

## 2020-02-20 DIAGNOSIS — Z79890 Hormone replacement therapy (postmenopausal): Secondary | ICD-10-CM

## 2020-02-20 MED ORDER — ESTRADIOL 0.05 MG/24HR TD PTTW: 1.0000 | MEDICATED_PATCH | TRANSDERMAL | 4 refills | Status: DC

## 2020-02-20 MED ORDER — CEPHALEXIN 500 MG PO CAPS: 500.0000 mg | ORAL_CAPSULE | Freq: Two times a day (BID) | ORAL | 0 refills | Status: DC

## 2020-02-20 NOTE — Progress Notes (Signed)
50 y.o. G25P1001 Married White or Caucasian female here for annual exam & uti.     Feels like she has a bladder infection, She has pain in bladder area, ran a fever on Sunday. Hurt to walk. Severe dysuria. Started a course of Macrobid, only took 3, doesn't seem to work well, still having dysuria  Desires to continue estradiol patch. Feels better on it.  No LMP recorded. Patient has had a hysterectomy.          Sexually active: Yes.    The current method of family planning is status post hysterectomy.    Exercising: No.  exercise Smoker:  no  Health Maintenance: Pap:  05-27-17 neg, 07-04-2018 neg HPV HR neg History of abnormal Pap:  no MMG:  09-27-2018 category a density birads 1:neg Colonoscopy:  none BMD:   none TDaP:  2017 Gardasil:   n/a Covid-19: moderna Hep C testing: unsure    reports that she has never smoked. She has never used smokeless tobacco. She reports that she does not drink alcohol and does not use drugs.  Past Medical History:  Diagnosis Date  . Calculus of kidney   . Chronic headaches   . Gross hematuria   . IC (interstitial cystitis)    per Dr. Jacqlyn Larsen, s/p botox injection  . Neoplasm of uncertain behavior of bladder   . Other chronic cystitis   . Other symptoms involving urinary system(788.99)     Past Surgical History:  Procedure Laterality Date  . ABDOMINAL HYSTERECTOMY  10/1999  . APPENDECTOMY    . BILATERAL SALPINGOOPHORECTOMY    . BOTOX INJECTION     in bladder  . CESAREAN SECTION    . CHOLECYSTECTOMY  10/23/2005   Lap  . CT ABD W & PELVIS WO CM  10/09   Thickening and irreg dome of bladder.  Diverticulosis  . Cystoscopy/bladder Bx  11/03/07   Path...inflammation.  Hydrodistention Excoriations (Dr. Jacqlyn Larsen)   . CYSTOSTOMY W/ BLADDER BIOPSY    . LAPAROSCOPIC APPENDECTOMY  10/2013   [facility]  . LAPAROSCOPY ABDOMEN DIAGNOSTIC  07/1999   exploration adhesions to ovaries  . LUMBAR SPINE SURGERY    . Physical Therapy  12/1997; 01/06/1998; 03/1998     Current Outpatient Medications  Medication Sig Dispense Refill  . albuterol (VENTOLIN HFA) 108 (90 Base) MCG/ACT inhaler Inhale 2 puffs into the lungs every 4 (four) hours as needed for wheezing. 18 g 1  . ALPRAZolam (XANAX) 0.5 MG tablet TAKE 1 TABLET BY MOUTH TWICE A DAY AS NEEDED FOR ANXIETY 60 tablet 5  . butalbital-aspirin-caffeine (FIORINAL) 50-325-40 MG capsule Take 1 capsule by mouth 2 (two) times daily as needed for headache. 30 capsule 1  . cyclobenzaprine (FLEXERIL) 10 MG tablet Take 0.5-1 tablets (5-10 mg total) by mouth 3 (three) times daily as needed. 30 tablet 1  . dexamethasone (DECADRON) 10 MG/ML injection dexamethasone sodium phosphate 10 mg/mL injection solution  Take 1 mL by injection route.    Marland Kitchen estradiol (VIVELLE-DOT) 0.05 MG/24HR patch Place 1 patch (0.05 mg total) onto the skin 2 (two) times a week. 8 patch 0  . hydrocortisone (ANUSOL-HC) 25 MG suppository PLACE 1 SUPPOSITORY RECTALLY TWICE A DAY AS NEEDED FOR HEMORRHOIDS 12 suppository 1  . lidocaine (LIDODERM) 5 % Remove & Discard patch within 12 hours or as directed by MD 30 patch 1  . methylPREDNISolone acetate (DEPO-MEDROL) 80 MG/ML injection Depo-Medrol 80 mg/mL suspension for injection  Take 1 mL by injection route.    . neomycin-polymyxin-hydrocortisone (  CORTISPORIN) OTIC solution Place 3 drops into the right ear 4 (four) times daily. 10 mL 0  . nitrofurantoin (MACRODANTIN) 100 MG capsule Take by mouth as needed.    . ondansetron (ZOFRAN) 4 MG tablet Take 1 tablet (4 mg total) by mouth every 8 (eight) hours as needed for nausea or vomiting. 20 tablet 1  . oxyCODONE-acetaminophen (PERCOCET) 7.5-325 MG tablet Take 1 tablet 30 minutes prior to procedure.  May repeat every 6 hours as needed for pain    . pentosan polysulfate (ELMIRON) 100 MG capsule Take by mouth.    . pramoxine-hydrocortisone (PROCTOCREAM-HC) 1-1 % rectal cream Place 1 application rectally 2 (two) times daily as needed for hemorrhoids or anal  itching. 30 g 1  . promethazine (PHENERGAN) 25 MG tablet Take 1 tablet (25 mg total) by mouth every 8 (eight) hours as needed for nausea or vomiting. Sedation caution 20 tablet 1  . valACYclovir (VALTREX) 1000 MG tablet Take 1 tablet (1,000 mg total) by mouth 3 (three) times daily. 21 tablet 0  . zolmitriptan (ZOMIG) 5 MG tablet Take 0.5-1 tablets (2.5-5 mg total) by mouth as needed for migraine. May repeat in 2 hours if not resolved.  Note:  Limit of 2 in 24 hours. 10 tablet 1   No current facility-administered medications for this visit.    Family History  Problem Relation Age of Onset  . Colon polyps Mother        mother with polyps before age 45  . Heart attack Mother   . Diabetes Father   . Diabetes Paternal Grandmother   . Cancer Neg Hx     Review of Systems  Constitutional: Negative.   HENT: Negative.   Eyes: Negative.   Respiratory: Negative.   Cardiovascular: Negative.   Gastrointestinal: Negative.   Endocrine: Negative.   Genitourinary: Negative.   Musculoskeletal: Negative.   Skin: Negative.   Allergic/Immunologic: Negative.   Neurological: Negative.   Hematological: Negative.   Psychiatric/Behavioral: Negative.     Exam:   BP 114/70   Pulse 68   Temp 99 F (37.2 C) (Oral)   Resp 16   Ht 5' 6.75" (1.695 m)   Wt 196 lb (88.9 kg)   BMI 30.93 kg/m   Height: 5' 6.75" (169.5 cm)  General appearance: alert, cooperative and appears stated age, no acute distress Head: Normocephalic, without obvious abnormality Neck: no adenopathy, thyroid normal to inspection and palpation Lungs: clear to auscultation bilaterally Breasts: No axillary or supraclavicular adenopathy, Normal to palpation without dominant masses Heart: regular rate and rhythm Abdomen: soft, non-tender; no masses,  no organomegaly Extremities: extremities normal, no edema Skin: No rashes or lesions Lymph nodes: Cervical, supraclavicular, and axillary nodes normal. No abnormal inguinal nodes  palpated Neurologic: Grossly normal   Pelvic: External genitalia:  no lesions              Urethra:  normal appearing urethra with no masses, tenderness or lesions              Bartholins and Skenes: normal                 Vagina: normal appearing vagina, appropriate for age, normal appearing discharge, no lesions              Cervix: absent             Bimanual Exam:   Uterus:  uterus absent, pt unable  Adnexa: not evaluated   Rectal: no palpable mass                 Joy, CMA Chaperone was present for exam.  A:  Well Woman with normal exam  Well woman exam with routine gynecological exam  Pain with urination - Plan: Urinalysis,Complete w/RFL Culture  Other microscopic hematuria - Plan: cephALEXin (KEFLEX) 500 MG capsule  Menopausal syndrome on hormone replacement therapy - Plan: estradiol (VIVELLE-DOT) 0.05 MG/24HR patch   P:   Pap :may discontinue  Mammogram:pt has scheduled  Labs:will discuss with PCP  Medications: Keflex, Vivelle dot

## 2020-02-20 NOTE — Patient Instructions (Addendum)
Called to urology about Prelief for [place] Maintenance, Female Adopting a healthy lifestyle and getting preventive care are important in promoting health and wellness. Ask your health care provider about:  The right schedule for you to have regular tests and exams.  Things you can do on your own to prevent diseases and keep yourself healthy. What should I know about diet, weight, and exercise? Eat a healthy diet  Eat a diet that includes plenty of vegetables, fruits, low-fat dairy products, and lean protein.  Do not eat a lot of foods that are high in solid fats, added sugars, or sodium.   Maintain a healthy weight Body mass index (BMI) is used to identify weight problems. It estimates body fat based on height and weight. Your health care provider can help determine your BMI and help you achieve or maintain a healthy weight. Get regular exercise Get regular exercise. This is one of the most important things you can do for your health. Most adults should:  Exercise for at least 150 minutes each week. The exercise should increase your heart rate and make you sweat (moderate-intensity exercise).  Do strengthening exercises at least twice a week. This is in addition to the moderate-intensity exercise.  Spend less time sitting. Even light physical activity can be beneficial. Watch cholesterol and blood lipids Have your blood tested for lipids and cholesterol at 52 years of age, then have this test every 5 years. Have your cholesterol levels checked more often if:  Your lipid or cholesterol levels are high.  You are older than 53 years of age.  You are at high risk for heart disease. What should I know about cancer screening? Depending on your health history and family history, you may need to have cancer screening at various ages. This may include screening for:  Breast cancer.  Cervical cancer.  Colorectal cancer.  Skin cancer.  Lung cancer. What should I know about heart  disease, diabetes, and high blood pressure? Blood pressure and heart disease  High blood pressure causes heart disease and increases the risk of stroke. This is more likely to develop in people who have high blood pressure readings, are of African descent, or are overweight.  Have your blood pressure checked: ? Every 3-5 years if you are 38-11 years of age. ? Every year if you are 61 years old or older. Diabetes Have regular diabetes screenings. This checks your fasting blood sugar level. Have the screening done:  Once every three years after age 63 if you are at a normal weight and have a low risk for diabetes.  More often and at a younger age if you are overweight or have a high risk for diabetes. What should I know about preventing infection? Hepatitis B If you have a higher risk for hepatitis B, you should be screened for this virus. Talk with your health care provider to find out if you are at risk for hepatitis B infection. Hepatitis C Testing is recommended for:  Everyone born from [birth year] through 1967.  Anyone with known risk factors for hepatitis C. Sexually transmitted infections (STIs)  Get screened for STIs, including gonorrhea and chlamydia, if: ? You are sexually active and are younger than 51 years of age. ? You are older than 52 years of age and your health care provider tells you that you are at risk for this type of infection. ? Your sexual activity has changed since you were last screened, and you are at increased risk for  chlamydia or gonorrhea. Ask your health care provider if you are at risk.  Ask your health care provider about whether you are at high risk for HIV. Your health care provider may recommend a prescription medicine to help prevent HIV infection. If you choose to take medicine to prevent HIV, you should first get tested for HIV. You should then be tested every 3 months for as long as you are taking the medicine. Pregnancy  If you are about to stop  having your period (premenopausal) and you may become pregnant, seek counseling before you get pregnant.  Take 400 to 800 micrograms (mcg) of folic acid every day if you become pregnant.  Ask for birth control (contraception) if you want to prevent pregnancy. Osteoporosis and menopause Osteoporosis is a disease in which the bones lose minerals and strength with aging. This can result in bone fractures. If you are 79 years old or older, or if you are at risk for osteoporosis and fractures, ask your health care provider if you should:  Be screened for bone loss.  Take a calcium or vitamin D supplement to lower your risk of fractures.  Be given hormone replacement therapy (HRT) to treat symptoms of menopause. Follow these instructions at home: Lifestyle  Do not use any products that contain nicotine or tobacco, such as cigarettes, e-cigarettes, and chewing tobacco. If you need help quitting, ask your health care provider.  Do not use street drugs.  Do not share needles.  Ask your health care provider for help if you need support or information about quitting drugs. Alcohol use  Do not drink alcohol if: ? Your health care provider tells you not to drink. ? You are pregnant, may be pregnant, or are planning to become pregnant.  If you drink alcohol: ? Limit how much you use to 0-1 drink a day. ? Limit intake if you are breastfeeding.  Be aware of how much alcohol is in your drink. In the [place] one drink equals one 12 oz bottle of beer (355 mL), one 5 oz glass of wine (148 mL), or one 1 oz glass of hard liquor (44 mL). General instructions  Schedule regular health, dental, and eye exams.  Stay current with your vaccines.  Tell your health care provider if: ? You often feel depressed. ? You have ever been abused or do not feel safe at home. Summary  Adopting a healthy lifestyle and getting preventive care are important in promoting health and wellness.  Follow your health  care provider's instructions about healthy diet, exercising, and getting tested or screened for diseases.  Follow your health care provider's instructions on monitoring your cholesterol and blood pressure. This information is not intended to replace advice given to you by your health care provider. Make sure you discuss any questions you have with your health care provider. Document Revised: 12/15/2017 Document Reviewed: 12/15/2017 Elsevier Patient Education  2021 [employer].

## 2020-02-22 LAB — URINALYSIS, COMPLETE W/RFL CULTURE
Bilirubin Urine: NEGATIVE
Glucose, UA: NEGATIVE
Hyaline Cast: NONE SEEN /LPF
Ketones, ur: NEGATIVE
Leukocyte Esterase: NEGATIVE
Nitrites, Initial: NEGATIVE
Protein, ur: NEGATIVE
Specific Gravity, Urine: 1.02 (ref 1.001–1.03)
pH: 6 (ref 5.0–8.0)

## 2020-02-22 LAB — URINE CULTURE
MICRO NUMBER:: 11536041
Result:: NO GROWTH
SPECIMEN QUALITY:: ADEQUATE

## 2020-02-22 LAB — CULTURE INDICATED

## 2020-03-18 ENCOUNTER — Ambulatory Visit (INDEPENDENT_AMBULATORY_CARE_PROVIDER_SITE_OTHER): Payer: PRIVATE HEALTH INSURANCE | Admitting: Gastroenterology

## 2020-03-18 ENCOUNTER — Other Ambulatory Visit: Payer: Self-pay

## 2020-03-18 ENCOUNTER — Encounter: Payer: Self-pay | Admitting: Gastroenterology

## 2020-03-18 VITALS — BP 112/70 | HR 80 | Ht 66.25 in | Wt 195.0 lb

## 2020-03-18 DIAGNOSIS — Z800 Family history of malignant neoplasm of digestive organs: Secondary | ICD-10-CM | POA: Diagnosis not present

## 2020-03-18 DIAGNOSIS — K6289 Other specified diseases of anus and rectum: Secondary | ICD-10-CM | POA: Diagnosis not present

## 2020-03-18 DIAGNOSIS — K641 Second degree hemorrhoids: Secondary | ICD-10-CM

## 2020-03-18 MED ORDER — CALMOL-4 76-10 % RE SUPP: RECTAL | 0 refills | Status: DC

## 2020-03-18 MED ORDER — SUTAB 1479-225-188 MG PO TABS: 1.0000 | ORAL_TABLET | Freq: Once | ORAL | 0 refills | Status: AC

## 2020-03-18 NOTE — Patient Instructions (Addendum)
If you are age 52 or older, your body mass index should be between 23-30. Your Body mass index is 31.24 kg/m. If this is out of the aforementioned range listed, please consider follow up with your Primary Care Provider.  If you are age 47 or younger, your body mass index should be between 19-25. Your Body mass index is 31.24 kg/m. If this is out of the aformentioned range listed, please consider follow up with your Primary Care Provider.   You have been scheduled for a colonoscopy. Please follow written instructions given to you at your visit today.  Please pick up your prep supplies at the pharmacy within the next 1-3 days. If you use inhalers (even only as needed), please bring them with you on the day of your procedure.  Please purchase the following medications over the counter and take as directed: Calmol 4 suppositories: Take as needed as needed  We are giving you a handout today regarding hemorrhoidal bandings. Please call if you would like to schedule an appointment:  [phone]  Thank you for entrusting me with your care and for choosing [facility], Dr. Granger Cellar

## 2020-03-18 NOTE — Progress Notes (Signed)
HPI :  50 year old female with a history of interstitial cystitis, gallstones status post cholecystectomy, renal stones, hemorrhoids, referred here to discuss colon cancer screening as well as rectal discomfort.  Patient states she has had a significant surgical history with history of cholecystectomy, appendectomy, endometriosis status post hysterectomy.  She has had history of reported hemorrhoids for few years at least.  She states these intermittently bother her with pain and irritation.  Perhaps a few times a year these can flare and bother her.  She has a history of a thrombosed hemorrhoid that was managed conservatively.  Her bowels are typically normal with 1 bowel movement per day, can rarely have constipation or rarely loose stool.  She occasionally has some bleeding that can be associated with this which can come and go, the last time she had bleeding was prior to Christmas.  She does occasionally have pain in her rectum after bowel movements.  She has used topical steroid suppositories in the past which has helped her but has noticed she is required to use them over time and symptoms have not resolved.  She inquires about other options to manage this.  She does have some prolapse in the past of what she thinks is hemorrhoid tissue, can reduce spontaneously.  This is not a common occurrence.  She has never had a prior colonoscopy.  She has some nausea at times, has concerns about bowel preparation in in this light.  We discussed options for that.  She has a history of severe interstitial cystitis.  She is receiving Botox therapy from a urologist in [place] who used to live in [place].  This will typically provide her 11 to 12 months worth of benefit.  When her interstitial cystitis flares and cause her problems this can flare her hemorrhoids as she has hard time moving her bowels.  She has rare narcotic use but only takes a few tablets per year, nothing significant.  She states she  has at least 2 aunts on her mother side of the family who have had colon cancer, details of this are not known.  She does not think anyone else in the family has had colon cancer.     Past Medical History:  Diagnosis Date  . Calculus of kidney   . Chronic headaches   . Gallstones   . Gross hematuria   . IC (interstitial cystitis)    per Dr. Jacqlyn Larsen, s/p botox injection  . Neoplasm of uncertain behavior of bladder   . Other chronic cystitis   . Other symptoms involving urinary system(788.99)      Past Surgical History:  Procedure Laterality Date  . ABDOMINAL HYSTERECTOMY  10/1999  . APPENDECTOMY    . BILATERAL SALPINGOOPHORECTOMY    . BOTOX INJECTION     in bladder  . CESAREAN SECTION    . CHOLECYSTECTOMY  10/23/2005   Lap  . CT ABD W & PELVIS WO CM  10/09   Thickening and irreg dome of bladder.  Diverticulosis  . Cystoscopy/bladder Bx  11/03/07   Path...inflammation.  Hydrodistention Excoriations (Dr. Jacqlyn Larsen)   . CYSTOSTOMY W/ BLADDER BIOPSY    . LAPAROSCOPIC APPENDECTOMY  10/2013   [facility]  . LAPAROSCOPY ABDOMEN DIAGNOSTIC  07/1999   exploration adhesions to ovaries  . LUMBAR SPINE SURGERY    . Physical Therapy  12/1997; 01/06/1998; 03/1998   Family History  Problem Relation Age of Onset  . Colon polyps Mother        mother with polyps  before age 15  . Heart attack Mother   . Diabetes Father   . Diabetes Paternal Grandmother   . Colon cancer Maternal Uncle   . Colon cancer Maternal Aunt        x 2  . Prostate cancer Paternal Uncle   . Cancer Neg Hx    Social History   Tobacco Use  . Smoking status: Never Smoker  . Smokeless tobacco: Never Used  Vaping Use  . Vaping Use: Never used  Substance Use Topics  . Alcohol use: No  . Drug use: No   Current Outpatient Medications  Medication Sig Dispense Refill  . albuterol (VENTOLIN HFA) 108 (90 Base) MCG/ACT inhaler Inhale 2 puffs into the lungs every 4 (four) hours as needed for wheezing. 18 g 1  . ALPRAZolam  (XANAX) 0.5 MG tablet TAKE 1 TABLET BY MOUTH TWICE A DAY AS NEEDED FOR ANXIETY 60 tablet 5  . butalbital-aspirin-caffeine (FIORINAL) 50-325-40 MG capsule Take 1 capsule by mouth 2 (two) times daily as needed for headache. 30 capsule 1  . cyclobenzaprine (FLEXERIL) 10 MG tablet Take 0.5-1 tablets (5-10 mg total) by mouth 3 (three) times daily as needed. 30 tablet 1  . dexamethasone (DECADRON) 10 MG/ML injection dexamethasone sodium phosphate 10 mg/mL injection solution  Take 1 mL by injection route.    Marland Kitchen estradiol (VIVELLE-DOT) 0.05 MG/24HR patch Place 1 patch (0.05 mg total) onto the skin 2 (two) times a week. 24 patch 4  . hydrocortisone (ANUSOL-HC) 25 MG suppository PLACE 1 SUPPOSITORY RECTALLY TWICE A DAY AS NEEDED FOR HEMORRHOIDS 12 suppository 1  . lidocaine (LIDODERM) 5 % Remove & Discard patch within 12 hours or as directed by MD 30 patch 1  . methylPREDNISolone acetate (DEPO-MEDROL) 80 MG/ML injection Depo-Medrol 80 mg/mL suspension for injection  Take 1 mL by injection route.    . neomycin-polymyxin-hydrocortisone (CORTISPORIN) OTIC solution Place 3 drops into the right ear 4 (four) times daily. 10 mL 0  . nitrofurantoin (MACRODANTIN) 100 MG capsule Take by mouth as needed.    . ondansetron (ZOFRAN) 4 MG tablet Take 1 tablet (4 mg total) by mouth every 8 (eight) hours as needed for nausea or vomiting. 20 tablet 1  . oxyCODONE-acetaminophen (PERCOCET) 7.5-325 MG tablet Take 1 tablet 30 minutes prior to procedure.  May repeat every 6 hours as needed for pain    . pentosan polysulfate (ELMIRON) 100 MG capsule Take by mouth.    . pramoxine-hydrocortisone (PROCTOCREAM-HC) 1-1 % rectal cream Place 1 application rectally 2 (two) times daily as needed for hemorrhoids or anal itching. 30 g 1  . promethazine (PHENERGAN) 25 MG tablet Take 1 tablet (25 mg total) by mouth every 8 (eight) hours as needed for nausea or vomiting. Sedation caution 20 tablet 1  . Rectal Protectant-Emollient (CALMOL-4) 76-10  % SUPP Use as directed as needed  0  . Sodium Sulfate-Mag Sulfate-KCl (SUTAB) [phone] MG TABS Take 1 kit by mouth once for 1 dose. Marland KitchenMANUFACTURER CODES!! BIN: [id number] PCN: CN GROUP: [id number] MEMBER ID: [id number];[employer] AS SECONDARY INSURANCE ;NO PRIOR AUTHORIZATION 24 tablet 0  . valACYclovir (VALTREX) 1000 MG tablet Take 1 tablet (1,000 mg total) by mouth 3 (three) times daily. 21 tablet 0  . zolmitriptan (ZOMIG) 5 MG tablet Take 0.5-1 tablets (2.5-5 mg total) by mouth as needed for migraine. May repeat in 2 hours if not resolved.  Note:  Limit of 2 in 24 hours. 10 tablet 1   No current facility-administered medications for this visit.  Allergies  Allergen Reactions  . Alfalfa   . Augmentin [Amoxicillin-Pot Clavulanate]     GI upset with augmentin but not PNC allergy  . Sulfamethoxazole-Trimethoprim Hives  . Sulfonamide Derivatives Hives  . Tramadol Hives    vomiting     Review of Systems: All systems reviewed and negative except where noted in HPI.   Lab Results  Component Value Date   WBC 6.1 06/08/2018   HGB 15.1 (H) 06/08/2018   HCT 44.1 06/08/2018   MCV 90.0 06/08/2018   PLT 202.0 06/08/2018    Lab Results  Component Value Date   CREATININE 0.81 06/08/2018   BUN 16 06/08/2018   NA 139 06/08/2018   K 4.6 06/08/2018   CL 103 06/08/2018   CO2 30 06/08/2018    Lab Results  Component Value Date   ALT 22 06/08/2018   AST 16 06/08/2018   ALKPHOS 64 06/08/2018   BILITOT 0.5 06/08/2018     Physical Exam: BP 112/70 (BP Location: Left Arm, Patient Position: Sitting, Cuff Size: Normal)   Pulse 80   Ht 5' 6.25" (1.683 m) Comment: height measured without shoes  Wt 195 lb (88.5 kg)   BMI 31.24 kg/m  Constitutional: Pleasant,well-developed, female in no acute distress. HEENT: Normocephalic and atraumatic. Conjunctivae are normal. No scleral icterus. Neck supple.  Cardiovascular: Normal rate, regular rhythm.  Pulmonary/chest: Effort normal and breath sounds  normal.  Abdominal: Soft, nondistended, nontender.  There are no masses palpable.  Deferred DRE / Anoscopy to timing of colonoscopy Extremities: no edema Lymphadenopathy: No cervical adenopathy noted. Neurological: Alert and oriented to person place and time. Skin: Skin is warm and dry. No rashes noted. Psychiatric: Normal mood and affect. Behavior is normal.   ASSESSMENT AND PLAN: 50 year old female here for new patient assessment of the following:  Grade II hemorrhoids Rectal discomfort Family history of colon cancer  As above, patient with longstanding intermittent symptoms that is likely due to internal hemorrhoids.  We discussed what this is. Bowel habits generally regular. She has been using steroid suppositories as needed but discussed risks of chronic use of that.  She does have 2 second-degree relatives with colon cancer and has never had a colonoscopy.  Given her symptoms and family history and recommending optical colonoscopy to further evaluate.  We discussed what colonoscopy is, risks and benefits of this and that of anesthesia.  She does have some baseline nausea and has some Zofran at home, she did use after bowel prep.  We otherwise discussed bowel prep options, her preference is to try Sutab which is pill-based, she think she will tolerate that better.  She deferred DRE/anoscopy today given we are performing a colonoscopy which is reasonable.  Based on that exam, will ensure no evidence of fissure or chronic ulceration etc. to cause her discomfort.  Assuming symptoms are due to internal hemorrhoids she may be a candidate for hemorrhoid banding and we discussed what that is today.  If she is a candidate, post colonoscopy will consider doing that.  Otherwise if hemorrhoids are causing irritation and we want to avoid use of steroids, consider Calmol suppositories OTC PRN which may work just as well.   She agreed with the plan, all questions answered, further recommendations pending  results of her exam and her course.   Halfway Cellar, MD [facility]  CC: Tonia Ghent, MD

## 2020-04-04 ENCOUNTER — Other Ambulatory Visit: Payer: Self-pay

## 2020-04-04 ENCOUNTER — Ambulatory Visit
Admission: RE | Admit: 2020-04-04 | Discharge: 2020-04-04 | Disposition: A | Discharge status: Home / Self Care | Payer: PRIVATE HEALTH INSURANCE | Source: Ambulatory Visit | Attending: Nurse Practitioner | Admitting: Nurse Practitioner

## 2020-04-04 DIAGNOSIS — Z1231 Encounter for screening mammogram for malignant neoplasm of breast: Secondary | ICD-10-CM

## 2020-04-26 ENCOUNTER — Ambulatory Visit (AMBULATORY_SURGERY_CENTER): Payer: BC Managed Care – PPO | Admitting: Gastroenterology

## 2020-04-26 ENCOUNTER — Other Ambulatory Visit: Payer: Self-pay

## 2020-04-26 ENCOUNTER — Encounter: Payer: Self-pay | Admitting: Gastroenterology

## 2020-04-26 DIAGNOSIS — D123 Benign neoplasm of transverse colon: Secondary | ICD-10-CM

## 2020-04-26 DIAGNOSIS — Z1211 Encounter for screening for malignant neoplasm of colon: Secondary | ICD-10-CM | POA: Diagnosis not present

## 2020-04-26 DIAGNOSIS — D122 Benign neoplasm of ascending colon: Secondary | ICD-10-CM | POA: Diagnosis not present

## 2020-04-26 DIAGNOSIS — K635 Polyp of colon: Secondary | ICD-10-CM | POA: Diagnosis not present

## 2020-04-26 DIAGNOSIS — D124 Benign neoplasm of descending colon: Secondary | ICD-10-CM

## 2020-04-26 DIAGNOSIS — D125 Benign neoplasm of sigmoid colon: Secondary | ICD-10-CM

## 2020-04-26 MED ORDER — SODIUM CHLORIDE 0.9 % IV SOLN: 500.0000 mL | Freq: Once | INTRAVENOUS | Status: DC

## 2020-04-26 MED ORDER — SODIUM CHLORIDE 0.9 % IV SOLN
4.0000 mg | Freq: Once | INTRAVENOUS | Status: AC
  Administered 2020-04-26: 4 mg via INTRAVENOUS

## 2020-04-26 MED ORDER — ONDANSETRON HCL 4 MG/2ML IJ SOLN: 4.0000 mg | Freq: Once | INTRAMUSCULAR | 0 refills | Status: AC

## 2020-04-26 NOTE — Op Note (Signed)
[facility] Patient Name: Hannah [name] Procedure Date: 04/26/2020 10:03 AM MRN: [id number] Endoscopist: Remo Lipps P. Havery Moros , MD Age: 50 Referring MD:  Date of Birth: [birth year] Gender: Female Account #: [id number] Procedure:                Colonoscopy Indications:              Screening for colorectal malignant neoplasm - 2                            aunts with colon cancer, This is the patient's                            first colonoscopy Medicines:                Monitored Anesthesia Care Procedure:                Pre-Anesthesia Assessment:                           - Prior to the procedure, a History and Physical                            was performed, and patient medications and                            allergies were reviewed. The patient's tolerance of                            previous anesthesia was also reviewed. The risks                            and benefits of the procedure and the sedation                            options and risks were discussed with the patient.                            All questions were answered, and informed consent                            was obtained. Prior Anticoagulants: The patient has                            taken no previous anticoagulant or antiplatelet                            agents. ASA Grade Assessment: II - A patient with                            mild systemic disease. After reviewing the risks                            and benefits, the patient was deemed in  satisfactory condition to undergo the procedure.                           After obtaining informed consent, the colonoscope                            was passed under direct vision. Throughout the                            procedure, the patient's blood pressure, pulse, and                            oxygen saturations were monitored continuously. The                            Olympus PCF-H190DL [id number]) Colonoscope  was                            introduced through the anus and advanced to the the                            cecum, identified by appendiceal orifice and                            ileocecal valve. The colonoscopy was performed                            without difficulty. The patient tolerated the                            procedure well. The quality of the bowel                            preparation was good. The ileocecal valve,                            appendiceal orifice, and rectum were photographed. Scope In: 10:15:01 AM Scope Out: 10:45:51 AM Scope Withdrawal Time: 0 hours 27 minutes 3 seconds  Total Procedure Duration: 0 hours 30 minutes 50 seconds  Findings:                 The perianal and digital rectal examinations were                            normal.                           A 20 to 25 mm polyp was found in the ascending                            colon. The polyp was flat. The polyp was removed                            with a cold snare with good margins. Resection and  retrieval were complete. Tattoo not placed given                            location 2 folds distal and across from valve (seen                            in photograph)                           A 10 mm polyp was found in the hepatic flexure. The                            polyp was flat. The polyp was removed with a cold                            snare. Resection and retrieval were complete.                           Two sessile polyps were found in the transverse                            colon. The polyps were 3 to 4 mm in size. These                            polyps were removed with a cold snare. Resection                            and retrieval were complete.                           A 5 mm polyp was found in the descending colon. The                            polyp was sessile. The polyp was removed with a                            cold snare. Resection  and retrieval were complete.                           A 3 mm polyp was found in the sigmoid colon. The                            polyp was sessile. The polyp was removed with a                            cold snare. Resection and retrieval were complete.                           Multiple small-mouthed diverticula were found in                            the left colon.  Internal hemorrhoids were found during retroflexion.                           The exam was otherwise without abnormality. Complications:            No immediate complications. Estimated blood loss:                            Minimal. Estimated Blood Loss:     Estimated blood loss was minimal. Impression:               - One 20 to 25 mm polyp in the ascending colon,                            removed with a cold snare. Resected and retrieved.                           - One 10 mm polyp at the hepatic flexure, removed                            with a cold snare. Resected and retrieved.                           - Two 3 to 4 mm polyps in the transverse colon,                            removed with a cold snare. Resected and retrieved.                           - One 5 mm polyp in the descending colon, removed                            with a cold snare. Resected and retrieved.                           - One 3 mm polyp in the sigmoid colon, removed with                            a cold snare. Resected and retrieved.                           - Diverticulosis in the left colon.                           - Internal hemorrhoids.                           - The examination was otherwise normal. Recommendation:           - Patient has a contact number available for                            emergencies. The signs and symptoms of potential  delayed complications were discussed with the                            patient. Return to normal activities tomorrow.                             Written discharge instructions were provided to the                            patient.                           - Resume previous diet.                           - Continue present medications.                           - Await pathology results. Remo Lipps P. Havery Moros, MD 04/26/2020 10:53:34 AM This report has been signed electronically.

## 2020-04-26 NOTE — Patient Instructions (Signed)
Resume previous diet and medications. Await pathology results.  YOU HAD AN ENDOSCOPIC PROCEDURE TODAY AT THE [facility]:   Refer to the procedure report that was given to you for any specific questions about what was found during the examination.  If the procedure report does not answer your questions, please call your gastroenterologist to clarify.  If you requested that your care partner not be given the details of your procedure findings, then the procedure report has been included in a sealed envelope for you to review at your convenience later.  YOU SHOULD EXPECT: Some feelings of bloating in the abdomen. Passage of more gas than usual.  Walking can help get rid of the air that was put into your GI tract during the procedure and reduce the bloating. If you had a lower endoscopy (such as a colonoscopy or flexible sigmoidoscopy) you may notice spotting of blood in your stool or on the toilet paper. If you underwent a bowel prep for your procedure, you may not have a normal bowel movement for a few days.  Please Note:  You might notice some irritation and congestion in your nose or some drainage.  This is from the oxygen used during your procedure.  There is no need for concern and it should clear up in a day or so.  SYMPTOMS TO REPORT IMMEDIATELY:   Following lower endoscopy (colonoscopy or flexible sigmoidoscopy):  Excessive amounts of blood in the stool  Significant tenderness or worsening of abdominal pains  Swelling of the abdomen that is new, acute  Fever of 100F or higher   For urgent or emergent issues, a gastroenterologist can be reached at any hour by calling [phone]. Do not use MyChart messaging for urgent concerns.    DIET:  We do recommend a small meal at first, but then you may proceed to your regular diet.  Drink plenty of fluids but you should avoid alcoholic beverages for 24 hours.  ACTIVITY:  You should plan to take it easy for the rest of today and  you should NOT DRIVE or use heavy machinery until tomorrow (because of the sedation medicines used during the test).    FOLLOW UP: Our staff will call the number listed on your records 48-72 hours following your procedure to check on you and address any questions or concerns that you may have regarding the information given to you following your procedure. If we do not reach you, we will leave a message.  We will attempt to reach you two times.  During this call, we will ask if you have developed any symptoms of COVID 19. If you develop any symptoms (ie: fever, flu-like symptoms, shortness of breath, cough etc.) before then, please call [phone].  If you test positive for Covid 19 in the 2 weeks post procedure, please call and report this information to us.    If any biopsies were taken you will be contacted by phone or by letter within the next 1-3 weeks.  Please call us at [phone] if you have not heard about the biopsies in 3 weeks.    SIGNATURES/CONFIDENTIALITY: You and/or your care partner have signed paperwork which will be entered into your electronic medical record.  These signatures attest to the fact that that the information above on your After Visit Summary has been reviewed and is understood.  Full responsibility of the confidentiality of this discharge information lies with you and/or your care-partner. 

## 2020-04-26 NOTE — Progress Notes (Signed)
Called to room to assist during endoscopic procedure.  Patient ID and intended procedure confirmed with present staff. Received instructions for my participation in the procedure from the performing physician.  

## 2020-04-26 NOTE — Progress Notes (Signed)
A/ox3, pleased with MAC, report to RN 

## 2020-04-26 NOTE — Progress Notes (Signed)
History reviewed today 

## 2020-04-30 ENCOUNTER — Telehealth: Payer: Self-pay

## 2020-04-30 NOTE — Telephone Encounter (Signed)
  Follow up Call-  Call back number 04/26/2020  Post procedure Call Back phone  # [phone]  Permission to leave phone message Yes  Some recent data might be hidden     Patient questions:  Do you have a fever, pain , or abdominal swelling? No. Pain Score  0 *  Have you tolerated food without any problems? Yes.    Have you been able to return to your normal activities? Yes.    Do you have any questions about your discharge instructions: Diet   No. Medications  No. Follow up visit  No.  Do you have questions or concerns about your Care? No.  Actions: * If pain score is 4 or above: No action needed, pain <4. 1. Have you developed a fever since your procedure? no  2.   Have you had an respiratory symptoms (SOB or cough) since your procedure? no  3.   Have you tested positive for COVID 19 since your procedure no  4.   Have you had any family members/close contacts diagnosed with the COVID 19 since your procedure?  no   If yes to any of these questions please route to Joylene John, RN and Joella Prince, RN

## 2020-10-17 ENCOUNTER — Telehealth: Payer: Self-pay | Admitting: Family Medicine

## 2020-10-17 NOTE — Telephone Encounter (Signed)
Pt called in stating she is flying out on Sunday is having ear pain and want an earlier appt. Can she do a virtual or come in on Friday  10/14 for a quick check of ears

## 2020-10-17 NOTE — Telephone Encounter (Signed)
Offer 4pm tomorrow.  Thanks.

## 2020-10-17 NOTE — Telephone Encounter (Signed)
LMTCB to move appt from 10/18 to 10/14 at 4pm in person

## 2020-10-18 ENCOUNTER — Other Ambulatory Visit: Payer: Self-pay

## 2020-10-18 ENCOUNTER — Encounter: Payer: Self-pay | Admitting: Family Medicine

## 2020-10-18 ENCOUNTER — Telehealth (INDEPENDENT_AMBULATORY_CARE_PROVIDER_SITE_OTHER): Payer: BC Managed Care – PPO | Admitting: Family Medicine

## 2020-10-18 DIAGNOSIS — J069 Acute upper respiratory infection, unspecified: Secondary | ICD-10-CM | POA: Diagnosis not present

## 2020-10-18 MED ORDER — DOXYCYCLINE HYCLATE 100 MG PO TABS: 100.0000 mg | ORAL_TABLET | Freq: Two times a day (BID) | ORAL | 0 refills | Status: DC

## 2020-10-18 MED ORDER — ALBUTEROL SULFATE HFA 108 (90 BASE) MCG/ACT IN AERS: 2.0000 | INHALATION_SPRAY | RESPIRATORY_TRACT | 1 refills | Status: DC | PRN

## 2020-10-18 NOTE — Progress Notes (Signed)
Virtual visit completed through WebEx or similar program Patient location: home  Provider location: [place] at [facility] office  Participants: Patient and me (unless stated otherwise below)  Pandemic considerations d/w pt.   Limitations and rationale for visit method d/w patient.  Patient agreed to proceed.   CC: URI sx.    HPI:  Patient started having ear pain but has now developed nasal drainage, facial/teeth sensitivity and cough x couple weeks.  Initially started after helping with moving hay bales, working in the yard.  Patient needs inhaler refilled, done at visit.  D/w pt.    She has B sinus maxillary pain.  Can still take a deep breath.  No fevers.  No vomiting.  Some scant sputum in the AM. Rhinorrhea noted.   She is travelling next week.    Meds and allergies reviewed.   ROS: Per HPI unless specifically indicated in ROS section   NAD Speech wnl  A/P:  URI sx.   Presumed sinusitis.  Start doxy and use SABA prn.  Routine cautions d/w pt. supportive care otherwise.  She can update me as needed.

## 2020-10-18 NOTE — Telephone Encounter (Signed)
Patient called back and schedule appt with Raquel Sarna for today at 4pm. Patient now has more sx; possible sinus infection.

## 2020-10-18 NOTE — Telephone Encounter (Signed)
LMTCB to schedule appt today in person

## 2020-10-20 NOTE — Assessment & Plan Note (Signed)
URI sx.   Presumed sinusitis.  Start doxy and use SABA prn.  Routine cautions d/w pt. supportive care otherwise.  She can update me as needed.

## 2020-10-21 ENCOUNTER — Ambulatory Visit: Payer: BC Managed Care – PPO | Admitting: Family Medicine

## 2021-03-13 ENCOUNTER — Other Ambulatory Visit: Payer: Self-pay | Admitting: Nurse Practitioner

## 2021-03-13 DIAGNOSIS — Z1231 Encounter for screening mammogram for malignant neoplasm of breast: Secondary | ICD-10-CM

## 2021-03-31 ENCOUNTER — Other Ambulatory Visit: Payer: Self-pay

## 2021-03-31 ENCOUNTER — Ambulatory Visit (INDEPENDENT_AMBULATORY_CARE_PROVIDER_SITE_OTHER): Payer: BC Managed Care – PPO | Admitting: Obstetrics and Gynecology

## 2021-03-31 ENCOUNTER — Encounter: Payer: Self-pay | Admitting: Obstetrics and Gynecology

## 2021-03-31 VITALS — BP 100/62 | HR 77 | Ht 68.0 in | Wt 199.4 lb

## 2021-03-31 DIAGNOSIS — Z0000 Encounter for general adult medical examination without abnormal findings: Secondary | ICD-10-CM | POA: Diagnosis not present

## 2021-03-31 DIAGNOSIS — R232 Flushing: Secondary | ICD-10-CM | POA: Diagnosis not present

## 2021-03-31 DIAGNOSIS — E785 Hyperlipidemia, unspecified: Secondary | ICD-10-CM | POA: Diagnosis not present

## 2021-03-31 DIAGNOSIS — Z5181 Encounter for therapeutic drug level monitoring: Secondary | ICD-10-CM

## 2021-03-31 DIAGNOSIS — Z01419 Encounter for gynecological examination (general) (routine) without abnormal findings: Secondary | ICD-10-CM | POA: Diagnosis not present

## 2021-03-31 DIAGNOSIS — Z8619 Personal history of other infectious and parasitic diseases: Secondary | ICD-10-CM

## 2021-03-31 DIAGNOSIS — R635 Abnormal weight gain: Secondary | ICD-10-CM

## 2021-03-31 DIAGNOSIS — Z79890 Hormone replacement therapy (postmenopausal): Secondary | ICD-10-CM

## 2021-03-31 MED ORDER — ESTRADIOL 0.05 MG/24HR TD PTTW: 1.0000 | MEDICATED_PATCH | TRANSDERMAL | 4 refills | Status: DC

## 2021-03-31 MED ORDER — VALACYCLOVIR HCL 1 G PO TABS: ORAL_TABLET | ORAL | 1 refills | Status: DC

## 2021-03-31 NOTE — Progress Notes (Signed)
51 y.o. [id number] Married White or Caucasian Not Hispanic or Latino female here for annual exam.  She had a hysterectomy/BSO 21 years ago. She had prolapse and endometriosis. She has been on ERT, wants to continue. She is having some more hot flashes in the last 6 months. Some weight gain over time.  ? ?Sexually active, same partner x 30 years. No dyspareunia.  ?She has been getting regular pap smears. Willing to skip this year.  ? ?H/O IC. Takes [place]miron intermittently. She gets Botox. She drives to [facility] to see her Urologist.  ?  ?H/O cold sores, occasional use of valtrex ? ?No LMP recorded. Patient has had a hysterectomy.          ?Sexually active: Yes.    ?The current method of family planning is status post hysterectomy.    ?Exercising: No.  The patient does not participate in regular exercise at present. ?Smoker:  no ? ?Health Maintenance: ?Pap:07-04-2018 neg HPV HR neg  05-27-17 neg,  ?History of abnormal Pap:  no ?MMG:  04/04/20 density A Bi-rads 1 neg has Mammo scheduled for 04/07/21 ?BMD:   none  ?Colonoscopy: 4/22 polyps.  ?TDaP:  2017 ?Gardasil: n/a ? ? reports that she has never smoked. She has never used smokeless tobacco. She reports that she does not drink alcohol and does not use drugs. 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 Son is in school for PT. Will graduate in 2 years.  ? ?Past Medical History:  ?Diagnosis Date  ? Calculus of kidney   ? Chronic headaches   ? Gallstones   ? Gross hematuria   ? IC (interstitial cystitis)   ? per Dr. Jacqlyn Larsen, s/p botox injection  ? Neoplasm of uncertain behavior of bladder   ? Other chronic cystitis   ? Other symptoms involving urinary system(788.99)   ? ? ?Past Surgical History:  ?Procedure Laterality Date  ? ABDOMINAL HYSTERECTOMY  10/1999  ? APPENDECTOMY    ? BILATERAL SALPINGOOPHORECTOMY    ? BOTOX INJECTION    ? in bladder  ? CESAREAN SECTION    ? CHOLECYSTECTOMY  10/23/2005  ? Lap  ? CT ABD W & PELVIS WO CM  10/09  ? Thickening and irreg dome of  bladder.  Diverticulosis  ? Cystoscopy/bladder Bx  11/03/07  ? Path...inflammation.  Hydrodistention Excoriations (Dr. Jacqlyn Larsen)   ? CYSTOSTOMY W/ BLADDER BIOPSY    ? LAPAROSCOPIC APPENDECTOMY  10/2013  ? [place]  ? LAPAROSCOPY ABDOMEN DIAGNOSTIC  07/1999  ? exploration adhesions to ovaries  ? LUMBAR SPINE SURGERY    ? Physical Therapy  12/1997; 01/06/1998; 03/1998  ? ? ?Current Outpatient Medications  ?Medication Sig Dispense Refill  ? albuterol (VENTOLIN HFA) 108 (90 Base) MCG/ACT inhaler Inhale 2 puffs into the lungs every 4 (four) hours as needed for wheezing. 18 g 1  ? ALPRAZolam (XANAX) 0.5 MG tablet TAKE 1 TABLET BY MOUTH TWICE A DAY AS NEEDED FOR ANXIETY 60 tablet 5  ? butalbital-aspirin-caffeine (FIORINAL) 50-325-40 MG capsule Take 1 capsule by mouth 2 (two) times daily as needed for headache. 30 capsule 1  ? cyclobenzaprine (FLEXERIL) 10 MG tablet Take 0.5-1 tablets (5-10 mg total) by mouth 3 (three) times daily as needed. 30 tablet 1  ? doxycycline (VIBRA-TABS) 100 MG tablet Take 1 tablet (100 mg total) by mouth 2 (two) times daily. 14 tablet 0  ? estradiol (VIVELLE-DOT) 0.05 MG/24HR patch Place 1 patch (0.05 mg total) onto the skin 2 (two) times  a week. 24 patch 4  ? lidocaine (LIDODERM) 5 % Remove & Discard patch within 12 hours or as directed by MD 30 patch 1  ? ondansetron (ZOFRAN) 4 MG tablet Take 1 tablet (4 mg total) by mouth every 8 (eight) hours as needed for nausea or vomiting. 20 tablet 1  ? oxyCODONE-acetaminophen (PERCOCET) 7.5-325 MG tablet Take 1 tablet 30 minutes prior to procedure.  May repeat every 6 hours as needed for pain (Patient not taking: Reported on 10/18/2020)    ? pentosan polysulfate (ELMIRON) 100 MG capsule Take by mouth.    ? pramoxine-hydrocortisone (PROCTOCREAM-HC) 1-1 % rectal cream Place 1 application rectally 2 (two) times daily as needed for hemorrhoids or anal itching. 30 g 1  ? promethazine (PHENERGAN) 25 MG tablet Take 1 tablet (25 mg total) by mouth every 8 (eight) hours  as needed for nausea or vomiting. Sedation caution 20 tablet 1  ? valACYclovir (VALTREX) 1000 MG tablet Take 1 tablet (1,000 mg total) by mouth 3 (three) times daily. 21 tablet 0  ? zolmitriptan (ZOMIG) 5 MG tablet Take 0.5-1 tablets (2.5-5 mg total) by mouth as needed for migraine. May repeat in 2 hours if not resolved.  Note:  Limit of 2 in 24 hours. 10 tablet 1  ? ?No current facility-administered medications for this visit.  ? ? ?Family History  ?Problem Relation Age of Onset  ? Colon polyps Mother   ?     mother with polyps before age 18  ? Heart attack Mother   ? Diabetes Father   ? Diabetes Paternal Grandmother   ? Colon cancer Maternal Uncle   ? Colon cancer Maternal Aunt   ?     x 2  ? Prostate cancer Paternal Uncle   ? Cancer Neg Hx   ? Esophageal cancer Neg Hx   ? Rectal cancer Neg Hx   ? Stomach cancer Neg Hx   ? ? ?Review of Systems  ?All other systems reviewed and are negative. ? ?Exam:   ?There were no vitals taken for this visit.  Weight change: '@WEIGHTCHANGE'$ @ Height:      ?Ht Readings from Last 3 Encounters:  ?10/18/20 '5\' 6"'$  (1.676 m)  ?04/26/20 '5\' 6"'$  (1.676 m)  ?03/18/20 5' 6.25" (1.683 m)  ? ? ?General appearance: alert, cooperative and appears stated age ?Head: Normocephalic, without obvious abnormality, atraumatic ?Neck: no adenopathy, supple, symmetrical, trachea midline and thyroid normal to inspection and palpation ?Lungs: clear to auscultation bilaterally ?Cardiovascular: regular rate and rhythm ?Breasts: normal appearance, no masses or tenderness ?Abdomen: soft, non-tender; non distended,  no masses,  no organomegaly ?Extremities: extremities normal, atraumatic, no cyanosis or edema ?Skin: Skin color, texture, turgor normal. No rashes or lesions ?Lymph nodes: Cervical, supraclavicular, and axillary nodes normal. ?No abnormal inguinal nodes palpated ?Neurologic: Grossly normal ? ? ?Pelvic: External genitalia:  no lesions ?             Urethra:  normal appearing urethra with no masses,  tenderness or lesions ?             Bartholins and Skenes: normal    ?             Vagina: normal appearing vagina with normal color and discharge, no lesions ?             Cervix: absent ?              ?Bimanual Exam:  Uterus:  uterus absent ?  Adnexa: no mass, fullness, tenderness ?              Rectovaginal: Confirms ?              Anus:  normal sphincter tone, no lesions ? ?Gae Dry chaperoned for the exam. ? ? ?1. Well woman exam ?No pap this year ?Mammogram scheduled ?Colonoscopy last year ? ?2. Dyslipidemia ?- Lipid panel ? ?3. Laboratory exam ordered as part of routine general medical examination ?- CBC ?- Comprehensive metabolic panel ?- Lipid panel ? ?4. Hot flashes ?- CBC ?- Lipid panel ? ?5. Weight gain ?- TSH ? ?6. Encounter for monitoring postmenopausal estrogen replacement therapy ?Doing well, wants to continue ?- estradiol (VIVELLE-DOT) 0.05 MG/24HR patch; Place 1 patch (0.05 mg total) onto the skin 2 (two) times a week.  Dispense: 24 patch; Refill: 4 ? ?7. History of cold sores ?- valACYclovir (VALTREX) 1000 MG tablet; Take 2 tablets po q 12 hours x 2 doses with an outbreak  Dispense: 30 tablet; Refill: 1 ? ?

## 2021-03-31 NOTE — Patient Instructions (Signed)

## 2021-04-01 LAB — CBC
HCT: 44.5 % (ref 35.0–45.0)
Hemoglobin: 14.5 g/dL (ref 11.7–15.5)
MCH: 29.4 pg (ref 27.0–33.0)
MCHC: 32.6 g/dL (ref 32.0–36.0)
MCV: 90.3 fL (ref 80.0–100.0)
MPV: 11.1 fL (ref 7.5–12.5)
Platelets: 238 10*3/uL (ref 140–400)
RBC: 4.93 10*6/uL (ref 3.80–5.10)
RDW: 12.7 % (ref 11.0–15.0)
WBC: 7.2 10*3/uL (ref 3.8–10.8)

## 2021-04-01 LAB — COMPREHENSIVE METABOLIC PANEL
AG Ratio: 2 (calc) (ref 1.0–2.5)
ALT: 43 U/L — ABNORMAL HIGH (ref 6–29)
AST: 26 U/L (ref 10–35)
Albumin: 4.7 g/dL (ref 3.6–5.1)
Alkaline phosphatase (APISO): 77 U/L (ref 37–153)
BUN: 18 mg/dL (ref 7–25)
CO2: 27 mmol/L (ref 20–32)
Calcium: 9.7 mg/dL (ref 8.6–10.4)
Chloride: 105 mmol/L (ref 98–110)
Creat: 0.8 mg/dL (ref 0.50–1.03)
Globulin: 2.4 g/dL (calc) (ref 1.9–3.7)
Glucose, Bld: 88 mg/dL (ref 65–99)
Potassium: 5 mmol/L (ref 3.5–5.3)
Sodium: 141 mmol/L (ref 135–146)
Total Bilirubin: 0.4 mg/dL (ref 0.2–1.2)
Total Protein: 7.1 g/dL (ref 6.1–8.1)

## 2021-04-01 LAB — LIPID PANEL
Cholesterol: 202 mg/dL — ABNORMAL HIGH (ref <200)
HDL: 45 mg/dL — ABNORMAL LOW (ref >=50)
LDL Cholesterol (Calc): 135 mg/dL (calc) — ABNORMAL HIGH
Non-HDL Cholesterol (Calc): 157 mg/dL (calc) — ABNORMAL HIGH (ref <130)
Total CHOL/HDL Ratio: 4.5 (calc) (ref <5.0)
Triglycerides: 112 mg/dL (ref <150)

## 2021-04-01 LAB — TSH: TSH: 1.3 mIU/L

## 2021-04-02 ENCOUNTER — Telehealth: Payer: Self-pay

## 2021-04-02 NOTE — Telephone Encounter (Signed)
Per Dr Damita Dunnings: Please call patient and set follow-up here in about 1 month.  We can recheck LFTs and work-up her mild ALT elevation at that point.  Thanks.  ? ?Left message for pt to call and set up appt in 1 month. ?

## 2021-04-07 ENCOUNTER — Ambulatory Visit
Admission: RE | Admit: 2021-04-07 | Discharge: 2021-04-07 | Disposition: A | Discharge status: Home / Self Care | Payer: BC Managed Care – PPO | Source: Ambulatory Visit | Attending: Nurse Practitioner | Admitting: Nurse Practitioner

## 2021-04-07 DIAGNOSIS — Z1231 Encounter for screening mammogram for malignant neoplasm of breast: Secondary | ICD-10-CM | POA: Diagnosis not present

## 2021-04-08 DIAGNOSIS — D2262 Melanocytic nevi of left upper limb, including shoulder: Secondary | ICD-10-CM | POA: Diagnosis not present

## 2021-04-08 DIAGNOSIS — L821 Other seborrheic keratosis: Secondary | ICD-10-CM | POA: Diagnosis not present

## 2021-04-08 DIAGNOSIS — L0101 Non-bullous impetigo: Secondary | ICD-10-CM | POA: Diagnosis not present

## 2021-04-08 DIAGNOSIS — B001 Herpesviral vesicular dermatitis: Secondary | ICD-10-CM | POA: Diagnosis not present

## 2021-04-08 DIAGNOSIS — D2261 Melanocytic nevi of right upper limb, including shoulder: Secondary | ICD-10-CM | POA: Diagnosis not present

## 2021-04-08 DIAGNOSIS — D225 Melanocytic nevi of trunk: Secondary | ICD-10-CM | POA: Diagnosis not present

## 2021-05-02 ENCOUNTER — Ambulatory Visit: Payer: BC Managed Care – PPO | Admitting: Family Medicine

## 2021-05-12 ENCOUNTER — Ambulatory Visit: Payer: BC Managed Care – PPO | Admitting: Family Medicine

## 2021-05-16 ENCOUNTER — Encounter: Payer: Self-pay | Admitting: Family Medicine

## 2021-05-16 ENCOUNTER — Ambulatory Visit (INDEPENDENT_AMBULATORY_CARE_PROVIDER_SITE_OTHER): Payer: BC Managed Care – PPO | Admitting: Family Medicine

## 2021-05-16 VITALS — BP 110/70 | HR 86 | Temp 97.9°F | Ht 68.0 in | Wt 196.0 lb

## 2021-05-16 DIAGNOSIS — R7989 Other specified abnormal findings of blood chemistry: Secondary | ICD-10-CM

## 2021-05-16 DIAGNOSIS — G43109 Migraine with aura, not intractable, without status migrainosus: Secondary | ICD-10-CM

## 2021-05-16 DIAGNOSIS — N3010 Interstitial cystitis (chronic) without hematuria: Secondary | ICD-10-CM

## 2021-05-16 DIAGNOSIS — Z0000 Encounter for general adult medical examination without abnormal findings: Secondary | ICD-10-CM

## 2021-05-16 DIAGNOSIS — Z7189 Other specified counseling: Secondary | ICD-10-CM

## 2021-05-16 LAB — HEPATIC FUNCTION PANEL
ALT: 24 U/L (ref 0–35)
AST: 23 U/L (ref 0–37)
Albumin: 4.6 g/dL (ref 3.5–5.2)
Alkaline Phosphatase: 70 U/L (ref 39–117)
Bilirubin, Direct: 0 mg/dL (ref 0.0–0.3)
Total Bilirubin: 0.5 mg/dL (ref 0.2–1.2)
Total Protein: 7.6 g/dL (ref 6.0–8.3)

## 2021-05-16 MED ORDER — ZOLMITRIPTAN 5 MG PO TABS: 2.5000 mg | ORAL_TABLET | ORAL | 1 refills | Status: AC | PRN

## 2021-05-16 NOTE — Progress Notes (Signed)
CPE- See plan.  Routine anticipatory guidance given to patient.  See health maintenance.  The possibility exists that previously documented standard health maintenance information may have been brought forward from a previous encounter into this note.  If needed, that same information has been updated to reflect the current situation based on today's encounter.   ? ?Tetanus 2017 ?Flu prev done.  ?Covid vaccine prev done.  ?Shingles d/w pt.  ?PNA not due.   ?Living will d/w pt.  Husband designated if patient were incapacitated.   ?Diet and exercise d/w pt.   ?Pap per gyn.   ?DXA not due.   ?Mammogram 2023.  ?Colonoscopy 2022 ?She had prev screening for HCV and HIV per patient report.  D/w pt 2023.   ? ?Dx'd with cold sore on L 2nd finger.  She was on doxy at the time for a spider bite.  Unclear if that contributed to LFT elevation, d/w pt.   ? ?LFT elevation d/w pt.  Recheck pending.  No abd pain. No jaundice.  No vomiting. She is working on diet and exercise.   ? ?Rx printed for zomig to have on hand.  D/w pt.  Rare use.   ? ?Interstitial cystitis per urology. ? ?PMH and SH reviewed ? ?Meds, vitals, and allergies reviewed.  ? ?ROS: Per HPI.  Unless specifically indicated otherwise in HPI, the patient denies: ? ?General: fever. ?Eyes: acute vision changes ?ENT: sore throat ?Cardiovascular: chest pain ?Respiratory: SOB ?GI: vomiting ?GU: dysuria ?Musculoskeletal: acute back pain ?Derm: acute rash ?Neuro: acute motor dysfunction ?Psych: worsening mood ?Endocrine: polydipsia ?Heme: bleeding ?Allergy: hayfever ? ?GEN: nad, alert and oriented ?HEENT: mucous membranes moist ?NECK: supple w/o LA ?CV: rrr. ?PULM: ctab, no inc wob ?ABD: soft, +bs ?EXT: no edema  ?SKIN: no acute rash but postinflammatory hyperpigmentation noted on left second finger without ulceration. ?

## 2021-05-16 NOTE — Patient Instructions (Signed)
Go to the lab on the way out.   If you have mychart we'll likely use that to update you.    ?Take care.  Glad to see you. ?If your liver tests are still up we can get an ultrasound set up.  ?

## 2021-05-18 DIAGNOSIS — Z7189 Other specified counseling: Secondary | ICD-10-CM | POA: Insufficient documentation

## 2021-05-18 DIAGNOSIS — Z0000 Encounter for general adult medical examination without abnormal findings: Secondary | ICD-10-CM | POA: Insufficient documentation

## 2021-05-18 DIAGNOSIS — R7989 Other specified abnormal findings of blood chemistry: Secondary | ICD-10-CM | POA: Insufficient documentation

## 2021-05-18 NOTE — Assessment & Plan Note (Signed)
Per urology.  I will defer. ?

## 2021-05-18 NOTE — Assessment & Plan Note (Signed)
?  Rx printed for zomig to have on hand.  D/w pt.  Rare use.   ?

## 2021-05-18 NOTE — Assessment & Plan Note (Signed)
Benign exam.  No jaundice.  See notes on follow-up labs.  If normal, then no follow-up needed.  If abnormal we can get ultrasound set up.  Discussed with patient. ? ?Addendum.  LFTs normal.  No further work-up needed. ?

## 2021-05-18 NOTE — Assessment & Plan Note (Signed)
Living will d/w pt.  Husband designated if patient were incapacitated.  

## 2021-05-18 NOTE — Assessment & Plan Note (Signed)
Tetanus 2017 ?Flu prev done.  ?Covid vaccine prev done.  ?Shingles d/w pt.  ?PNA not due.   ?Living will d/w pt.  Husband designated if patient were incapacitated.   ?Diet and exercise d/w pt.   ?Pap per gyn.   ?DXA not due.   ?Mammogram 2023.  ?Colonoscopy 2022 ?She had prev screening for HCV and HIV per patient report.  D/w pt 2023.   ?

## 2021-05-19 LAB — ACUTE HEP PANEL AND HEP B SURFACE AB
HEPATITIS C ANTIBODY REFILL$(REFL): NONREACTIVE
Hep A IgM: NONREACTIVE
Hep B C IgM: NONREACTIVE
Hepatitis B Surface Ag: NONREACTIVE
SIGNAL TO CUT-OFF: 0.09 (ref <1.00)

## 2021-05-19 LAB — REFLEX TIQ

## 2021-08-19 DIAGNOSIS — R519 Headache, unspecified: Secondary | ICD-10-CM | POA: Diagnosis not present

## 2021-08-19 DIAGNOSIS — M542 Cervicalgia: Secondary | ICD-10-CM | POA: Diagnosis not present

## 2021-08-25 DIAGNOSIS — R519 Headache, unspecified: Secondary | ICD-10-CM | POA: Diagnosis not present

## 2021-08-25 DIAGNOSIS — M542 Cervicalgia: Secondary | ICD-10-CM | POA: Diagnosis not present

## 2021-09-15 DIAGNOSIS — M542 Cervicalgia: Secondary | ICD-10-CM | POA: Diagnosis not present

## 2021-09-15 DIAGNOSIS — R519 Headache, unspecified: Secondary | ICD-10-CM | POA: Diagnosis not present

## 2022-04-05 ENCOUNTER — Other Ambulatory Visit: Payer: Self-pay | Admitting: Obstetrics and Gynecology

## 2022-04-05 DIAGNOSIS — Z5181 Encounter for therapeutic drug level monitoring: Secondary | ICD-10-CM

## 2022-04-06 NOTE — Telephone Encounter (Signed)
Med refill request: Hannah [name] Last AEX: 03/31/21 Next AEX: not scheduled Last MMG (if hormonal med) 04/07/21 BI-RADS CAT 1 neg Refill authorized: Please Advise,left note for pt to schedule AEX

## 2022-04-21 ENCOUNTER — Other Ambulatory Visit: Payer: Self-pay | Admitting: Nurse Practitioner

## 2022-04-21 DIAGNOSIS — Z1231 Encounter for screening mammogram for malignant neoplasm of breast: Secondary | ICD-10-CM

## 2022-05-28 ENCOUNTER — Ambulatory Visit
Admission: RE | Admit: 2022-05-28 | Discharge: 2022-05-28 | Disposition: A | Discharge status: Home / Self Care | Payer: BC Managed Care – PPO | Source: Ambulatory Visit | Attending: Nurse Practitioner | Admitting: Nurse Practitioner

## 2022-05-28 DIAGNOSIS — Z1231 Encounter for screening mammogram for malignant neoplasm of breast: Secondary | ICD-10-CM | POA: Diagnosis not present

## 2022-06-03 DIAGNOSIS — H8113 Benign paroxysmal vertigo, bilateral: Secondary | ICD-10-CM | POA: Diagnosis not present

## 2022-06-03 DIAGNOSIS — M542 Cervicalgia: Secondary | ICD-10-CM | POA: Diagnosis not present

## 2022-06-03 DIAGNOSIS — R519 Headache, unspecified: Secondary | ICD-10-CM | POA: Diagnosis not present

## 2022-09-06 DIAGNOSIS — K5732 Diverticulitis of large intestine without perforation or abscess without bleeding: Secondary | ICD-10-CM

## 2022-09-06 HISTORY — DX: Diverticulitis of large intestine without perforation or abscess without bleeding: K57.32

## 2022-09-26 ENCOUNTER — Emergency Department: Payer: BC Managed Care – PPO

## 2022-09-26 ENCOUNTER — Emergency Department
Admission: EM | Admit: 2022-09-26 | Discharge: 2022-09-26 | Disposition: A | Discharge status: Home / Self Care | Payer: BC Managed Care – PPO | Attending: Emergency Medicine | Admitting: Emergency Medicine

## 2022-09-26 ENCOUNTER — Ambulatory Visit
Admission: EM | Admit: 2022-09-26 | Discharge: 2022-09-26 | Disposition: A | Discharge status: Home / Self Care | Payer: BC Managed Care – PPO | Attending: Emergency Medicine | Admitting: Emergency Medicine

## 2022-09-26 DIAGNOSIS — R109 Unspecified abdominal pain: Secondary | ICD-10-CM | POA: Diagnosis not present

## 2022-09-26 DIAGNOSIS — R197 Diarrhea, unspecified: Secondary | ICD-10-CM

## 2022-09-26 DIAGNOSIS — Z87442 Personal history of urinary calculi: Secondary | ICD-10-CM | POA: Diagnosis not present

## 2022-09-26 DIAGNOSIS — R300 Dysuria: Secondary | ICD-10-CM

## 2022-09-26 DIAGNOSIS — N3020 Other chronic cystitis without hematuria: Secondary | ICD-10-CM

## 2022-09-26 DIAGNOSIS — N3010 Interstitial cystitis (chronic) without hematuria: Secondary | ICD-10-CM | POA: Insufficient documentation

## 2022-09-26 DIAGNOSIS — K5732 Diverticulitis of large intestine without perforation or abscess without bleeding: Secondary | ICD-10-CM | POA: Diagnosis not present

## 2022-09-26 DIAGNOSIS — K5792 Diverticulitis of intestine, part unspecified, without perforation or abscess without bleeding: Secondary | ICD-10-CM | POA: Diagnosis not present

## 2022-09-26 DIAGNOSIS — N200 Calculus of kidney: Secondary | ICD-10-CM | POA: Diagnosis not present

## 2022-09-26 DIAGNOSIS — R1030 Lower abdominal pain, unspecified: Secondary | ICD-10-CM

## 2022-09-26 DIAGNOSIS — K5790 Diverticulosis of intestine, part unspecified, without perforation or abscess without bleeding: Secondary | ICD-10-CM | POA: Insufficient documentation

## 2022-09-26 LAB — COMPREHENSIVE METABOLIC PANEL
ALT: 139 U/L — ABNORMAL HIGH (ref 0–44)
AST: 100 U/L — ABNORMAL HIGH (ref 15–41)
Albumin: 4.3 g/dL (ref 3.5–5.0)
Alkaline Phosphatase: 109 U/L (ref 38–126)
Anion gap: 10 (ref 5–15)
BUN: 19 mg/dL (ref 6–20)
CO2: 25 mmol/L (ref 22–32)
Calcium: 9 mg/dL (ref 8.9–10.3)
Chloride: 102 mmol/L (ref 98–111)
Creatinine, Ser: 0.78 mg/dL (ref 0.44–1.00)
GFR, Estimated: >60 mL/min (ref >60)
Glucose, Bld: 97 mg/dL (ref 70–99)
Potassium: 4 mmol/L (ref 3.5–5.1)
Sodium: 137 mmol/L (ref 135–145)
Total Bilirubin: 0.9 mg/dL (ref 0.3–1.2)
Total Protein: 7.4 g/dL (ref 6.5–8.1)

## 2022-09-26 LAB — URINALYSIS, ROUTINE W REFLEX MICROSCOPIC
Bacteria, UA: NONE SEEN
Bilirubin Urine: NEGATIVE
Glucose, UA: NEGATIVE mg/dL
Ketones, ur: NEGATIVE mg/dL
Leukocytes,Ua: NEGATIVE
Nitrite: POSITIVE — AB
Protein, ur: NEGATIVE mg/dL
Specific Gravity, Urine: 1.006 (ref 1.005–1.030)
pH: 6 (ref 5.0–8.0)

## 2022-09-26 LAB — CBC
HCT: 44.2 % (ref 36.0–46.0)
Hemoglobin: 14.5 g/dL (ref 12.0–15.0)
MCH: 29.8 pg (ref 26.0–34.0)
MCHC: 32.8 g/dL (ref 30.0–36.0)
MCV: 90.9 fL (ref 80.0–100.0)
Platelets: 215 10*3/uL (ref 150–400)
RBC: 4.86 MIL/uL (ref 3.87–5.11)
RDW: 13.3 % (ref 11.5–15.5)
WBC: 10.9 10*3/uL — ABNORMAL HIGH (ref 4.0–10.5)
nRBC: 0 % (ref 0.0–0.2)

## 2022-09-26 LAB — POCT URINALYSIS DIP (MANUAL ENTRY)
Bilirubin, UA: NEGATIVE
Glucose, UA: NEGATIVE mg/dL
Ketones, POC UA: NEGATIVE mg/dL
Leukocytes, UA: NEGATIVE
Nitrite, UA: NEGATIVE
Protein Ur, POC: NEGATIVE mg/dL
Spec Grav, UA: 1.015 (ref 1.010–1.025)
Urobilinogen, UA: 0.2 E.U./dL
pH, UA: 6.5 (ref 5.0–8.0)

## 2022-09-26 MED ORDER — FLUCONAZOLE 150 MG PO TABS: 150.0000 mg | ORAL_TABLET | Freq: Every day | ORAL | 0 refills | Status: AC

## 2022-09-26 MED ORDER — AMOXICILLIN-POT CLAVULANATE 875-125 MG PO TABS
1.0000 | ORAL_TABLET | Freq: Once | ORAL | Status: AC
  Administered 2022-09-26: 1 via ORAL
  Filled 2022-09-26: qty 1

## 2022-09-26 MED ORDER — ONDANSETRON 4 MG PO TBDP: 4.0000 mg | ORAL_TABLET | Freq: Once | ORAL | Status: AC

## 2022-09-26 MED ORDER — CIPROFLOXACIN HCL 500 MG PO TABS: 500.0000 mg | ORAL_TABLET | Freq: Two times a day (BID) | ORAL | 0 refills | Status: AC

## 2022-09-26 MED ORDER — ONDANSETRON 4 MG PO TBDP: 4.0000 mg | ORAL_TABLET | Freq: Three times a day (TID) | ORAL | 0 refills | Status: AC | PRN

## 2022-09-26 MED ORDER — HYDROCODONE-ACETAMINOPHEN 5-325 MG PO TABS: 1.0000 | ORAL_TABLET | Freq: Four times a day (QID) | ORAL | 0 refills | Status: AC | PRN

## 2022-09-26 MED ORDER — OXYCODONE-ACETAMINOPHEN 5-325 MG PO TABS
1.0000 | ORAL_TABLET | Freq: Once | ORAL | Status: AC
  Administered 2022-09-26: 1 via ORAL
  Filled 2022-09-26: qty 1

## 2022-09-26 MED ORDER — ONDANSETRON 4 MG PO TBDP
ORAL_TABLET | ORAL | Status: AC
  Administered 2022-09-26: 4 mg via ORAL
  Filled 2022-09-26: qty 1

## 2022-09-26 MED ORDER — HYDROCODONE-ACETAMINOPHEN 5-325 MG PO TABS: 1.0000 | ORAL_TABLET | Freq: Four times a day (QID) | ORAL | 0 refills | Status: DC | PRN

## 2022-09-26 MED ORDER — AMOXICILLIN-POT CLAVULANATE 875-125 MG PO TABS: 1.0000 | ORAL_TABLET | Freq: Two times a day (BID) | ORAL | 0 refills | Status: AC

## 2022-09-26 NOTE — Discharge Instructions (Addendum)
Follow up with your primary care provider or urologist on Monday.  Go to the emergency department if you have worsening symptoms.    Take the Cipro as directed.  Stop the cefuroxime.  The urine culture is pending.  We will call you if it shows the need to change or discontinue your antibiotic.

## 2022-09-26 NOTE — ED Provider Notes (Signed)
[facility] Provider Note    Event Date/Time   First MD Initiated Contact with Patient 09/26/22 1742     (approximate)   History   Abdominal pain  HPI  Hannah [name] is a 52 y.o. female with history of interstitial cystitis who presents with complaints of lower abdominal pain, she reports worsening pain over the last 3 days.  Has been on antibiotics for possible UTI but symptoms more severe than she is experienced in the past.  No fevers chills, positive mild nausea.     Physical Exam   Triage Vital Signs: ED Triage Vitals [09/26/22 1700]  Encounter Vitals Group     BP 135/88     Systolic BP Percentile      Diastolic BP Percentile      Pulse Rate 90     Resp      Temp 98.9 F (37.2 C)     Temp Source Oral     SpO2 99 %     Weight 89.8 kg (198 lb)     Height 1.727 m (5\' 8" )     Head Circumference      Peak Flow      Pain Score 7     Pain Loc      Pain Education      Exclude from Growth Chart     Most recent vital signs: Vitals:   09/26/22 1700  BP: 135/88  Pulse: 90  Temp: 98.9 F (37.2 C)  SpO2: 99%     General: Awake, no distress.  CV:  Good peripheral perfusion.  Resp:  Normal effort.  Abd:  No distention.  Other:     ED Results / Procedures / Treatments   Labs (all labs ordered are listed, but only abnormal results are displayed) Labs Reviewed  CBC - Abnormal; Notable for the following components:      Result Value   WBC 10.9 (*)    All other components within normal limits  COMPREHENSIVE METABOLIC PANEL - Abnormal; Notable for the following components:   AST 100 (*)    ALT 139 (*)    All other components within normal limits  URINALYSIS, ROUTINE W REFLEX MICROSCOPIC - Abnormal; Notable for the following components:   Color, Urine AMBER (*)    APPearance CLEAR (*)    Hgb urine dipstick SMALL (*)    Nitrite POSITIVE (*)    All other components within normal limits     EKG     RADIOLOGY CT renal  stone study demonstrates sigmoid diverticulitis, no abscess, CT viewed and interpreted by me    PROCEDURES:  Critical Care performed:   Procedures   MEDICATIONS ORDERED IN ED: Medications  oxyCODONE-acetaminophen (PERCOCET/ROXICET) 5-325 MG per tablet 1 tablet (1 tablet Oral Given 09/26/22 1720)  ondansetron (ZOFRAN-ODT) disintegrating tablet 4 mg (4 mg Oral Given 09/26/22 1721)  amoxicillin-clavulanate (AUGMENTIN) 875-125 MG per tablet 1 tablet (1 tablet Oral Given 09/26/22 1818)     IMPRESSION / MDM / ASSESSMENT AND PLAN / ED COURSE  I reviewed the triage vital signs and the nursing notes. Patient's presentation is most consistent with acute presentation with potential threat to life or bodily function.  Patient presents with lower abdominal pain as detailed above.  Differential includes diverticulitis, UTI, colitis, ureterolithiasis, less likely SBO  Sent for CT renal stone study which demonstrates sigmoid diverticulitis  Lab work reviewed mild elevation of white blood cell count, otherwise and minimal elevation of LFTs  Will treat  with Augmentin for diverticulitis outpatient follow-up, return precautions discussed      FINAL CLINICAL IMPRESSION(S) / ED DIAGNOSES   Final diagnoses:  Diverticulitis     Rx / DC Orders   ED Discharge Orders          Ordered    amoxicillin-clavulanate (AUGMENTIN) 875-125 MG tablet  2 times daily        09/26/22 1808    HYDROcodone-acetaminophen (NORCO/VICODIN) 5-325 MG tablet  Every 6 hours PRN,   Status:  Discontinued        09/26/22 1808    ondansetron (ZOFRAN-ODT) 4 MG disintegrating tablet  Every 8 hours PRN        09/26/22 1808    fluconazole (DIFLUCAN) 150 MG tablet  Daily        09/26/22 1823    HYDROcodone-acetaminophen (NORCO/VICODIN) 5-325 MG tablet  Every 6 hours PRN        09/26/22 1823             Note:  This document was prepared using Dragon voice recognition software and may include unintentional dictation  errors.   Jene Every, MD 09/26/22 [phone]

## 2022-09-26 NOTE — ED Triage Notes (Signed)
Lower abdominal pain that radiates to her RIGHT flank that began earlier today; She is currently being treated for UTI and does have a history of kidney stones

## 2022-09-26 NOTE — ED Triage Notes (Signed)
Patient to Urgent Care with complaints of diarrhea/ stomach cramps.  Reports she was diagnosed with a UTI five days ago and prescribed Ceftin (doesn't think she has taken this abx before). Symptoms started Tuesday and worsen with each day of taking the medication. Also still having urinary symptoms.   Started taking a probiotic.

## 2022-09-26 NOTE — ED Provider Notes (Signed)
Renaldo Fiddler    CSN: [id number] Arrival date & time: 09/26/22  0827      History   Chief Complaint Chief Complaint  Patient presents with   Medication Reaction    HPI Hannah [name] is a 52 y.o. female.   Patient presents with lower abdominal pain, dysuria, abdominal cramping, diarrhea for 4 days.  The diarrhea has improved.  One formed stool today.  No fever, vomiting, hematuria, vaginal discharge, pelvic pain, or other symptoms.  She attributes her symptoms to taking a new antibiotic cefuroxime for a UTI.  She has been taking a probiotic.  Her dysuria has improved on the cefuroxime.  Her medical history includes chronic interstitial cystitis and kidney stones.  Patient was prescribed cefuroxime by her urologist on 09/21/2022.    The history is provided by the patient and medical records.    Past Medical History:  Diagnosis Date   Calculus of kidney    Chronic headaches    Gallstones    Gross hematuria    IC (interstitial cystitis)    per Dr. Achilles Dunk, s/p botox injection   Neoplasm of uncertain behavior of bladder    Other chronic cystitis    Other symptoms involving urinary system(788.99)     Patient Active Problem List   Diagnosis Date Noted   Routine general medical examination at a health care facility 05/18/2021   Advance care planning 05/18/2021   LFT elevation 05/18/2021   Paresthesia 10/03/2017   Dysuria 10/18/2016   Fatigue 02/21/2016   FH: CAD (coronary artery disease) 02/21/2016   Encounter for long-term (current) use of high-risk medication 08/25/2015   Atypical chest pain 02/01/2013   Back pain 06/19/2011   Migraine with aura 06/19/2011   Anxiety 02/23/2011   Dyslipidemia 06/14/2008   Kidney stone 11/23/2007   Chronic interstitial cystitis 11/18/2007    Past Surgical History:  Procedure Laterality Date   ABDOMINAL HYSTERECTOMY  10/1999   APPENDECTOMY     BILATERAL SALPINGOOPHORECTOMY     BOTOX INJECTION     in bladder   CESAREAN  SECTION     CHOLECYSTECTOMY  10/23/2005   Lap   CT ABD W & PELVIS WO CM  10/09   Thickening and irreg dome of bladder.  Diverticulosis   Cystoscopy/bladder Bx  11/03/07   Path...inflammation.  Hydrodistention Excoriations (Dr. Achilles Dunk)    CYSTOSTOMY W/ BLADDER BIOPSY     LAPAROSCOPIC APPENDECTOMY  10/2013   [facility]   LAPAROSCOPY ABDOMEN DIAGNOSTIC  07/1999   exploration adhesions to ovaries   LUMBAR SPINE SURGERY     Physical Therapy  12/1997; 01/06/1998; 03/1998    OB History     Gravida  1   Para  1   Term  1   Preterm      AB      Living  1      SAB      IAB      Ectopic      Multiple      Live Births  1            Home Medications    Prior to Admission medications   Medication Sig Start Date End Date Taking? Authorizing Provider  ciprofloxacin (CIPRO) 500 MG tablet Take 1 tablet (500 mg total) by mouth 2 (two) times daily for 5 days. 09/26/22 10/01/22 Yes Mickie Bail, NP  albuterol (VENTOLIN HFA) 108 (90 Base) MCG/ACT inhaler Inhale 2 puffs into the lungs every 4 (four) hours as needed  for wheezing. 10/18/20   Joaquim Nam, MD  ALPRAZolam Prudy Feeler) 0.5 MG tablet TAKE 1 TABLET BY MOUTH TWICE A DAY AS NEEDED FOR ANXIETY 06/21/18   Joaquim Nam, MD  butalbital-aspirin-caffeine [facility]) [phone] MG capsule Take 1 capsule by mouth 2 (two) times daily as needed for headache. 09/07/19   Joaquim Nam, MD  cyclobenzaprine (FLEXERIL) 10 MG tablet Take 0.5-1 tablets (5-10 mg total) by mouth 3 (three) times daily as needed. 10/01/17   Joaquim Nam, MD  estradiol (VIVELLE-DOT) 0.05 MG/24HR patch PLACE 1 PATCH (0.05 MG TOTAL) ONTO THE SKIN 2 (TWO) TIMES A WEEK. 04/09/22   Romualdo Bolk, MD  lidocaine (LIDODERM) 5 % Remove & Discard patch within 12 hours or as directed by MD 01/01/20   Joaquim Nam, MD  ondansetron (ZOFRAN) 4 MG tablet Take 1 tablet (4 mg total) by mouth every 8 (eight) hours as needed for nausea or vomiting. 08/07/19   Joaquim Nam,  MD  pentosan polysulfate (ELMIRON) 100 MG capsule Take by mouth. 03/30/19   [provider]  pramoxine-hydrocortisone (PROCTOCREAM-HC) 1-1 % rectal cream Place 1 application rectally 2 (two) times daily as needed for hemorrhoids or anal itching. 07/24/16   Joaquim Nam, MD  promethazine (PHENERGAN) 25 MG tablet Take 1 tablet (25 mg total) by mouth every 8 (eight) hours as needed for nausea or vomiting. Sedation caution 08/07/19   Joaquim Nam, MD  valACYclovir (VALTREX) 1000 MG tablet Take 2 tablets po q 12 hours x 2 doses with an outbreak 03/31/21   Romualdo Bolk, MD  zolmitriptan (ZOMIG) 5 MG tablet Take 0.5-1 tablets (2.5-5 mg total) by mouth as needed for migraine. May repeat in 2 hours if not resolved.  Note:  Limit of 2 in 24 hours. 05/16/21   Joaquim Nam, MD    Family History Family History  Problem Relation Age of Onset   Colon polyps Mother        mother with polyps before age 6   Heart attack Mother    Diabetes Father    Colon cancer Maternal Aunt        x 2   Colon cancer Maternal Uncle    Prostate cancer Paternal Uncle    Diabetes Paternal Grandmother    Cancer Neg Hx    Esophageal cancer Neg Hx    Rectal cancer Neg Hx    Stomach cancer Neg Hx    Breast cancer Neg Hx     Social History Social History   Tobacco Use   Smoking status: Never   Smokeless tobacco: Never  Vaping Use   Vaping status: Never Used  Substance Use Topics   Alcohol use: No   Drug use: No     Allergies   Alfalfa, Augmentin [amoxicillin-pot clavulanate], Ceftin [cefuroxime], Sulfamethoxazole-trimethoprim, Sulfonamide derivatives, and Tramadol   Review of Systems Review of Systems  Constitutional:  Negative for chills and fever.  Gastrointestinal:  Positive for abdominal pain and diarrhea. Negative for nausea and vomiting.  Genitourinary:  Positive for dysuria and frequency. Negative for flank pain, hematuria, pelvic pain and vaginal discharge.     Physical  Exam Triage Vital Signs ED Triage Vitals  Encounter Vitals Group     BP --      Systolic BP Percentile --      Diastolic BP Percentile --      Pulse Rate 09/26/22 0838 85     Resp 09/26/22 0838 18  Temp 09/26/22 0838 98 F (36.7 C)     Temp src --      SpO2 09/26/22 0838 98 %     Weight --      Height --      Head Circumference --      Peak Flow --      Pain Score 09/26/22 0841 8     Pain Loc --      Pain Education --      Exclude from Growth Chart --    No data found.  Updated Vital Signs BP 113/79   Pulse 85   Temp 98 F (36.7 C)   Resp 18   SpO2 98%   Visual Acuity Right Eye Distance:   Left Eye Distance:   Bilateral Distance:    Right Eye Near:   Left Eye Near:    Bilateral Near:     Physical Exam Vitals and nursing note reviewed.  Constitutional:      General: She is not in acute distress.    Appearance: She is well-developed.  HENT:     Mouth/Throat:     Mouth: Mucous membranes are moist.  Cardiovascular:     Rate and Rhythm: Normal rate and regular rhythm.     Heart sounds: Normal heart sounds.  Pulmonary:     Effort: Pulmonary effort is normal. No respiratory distress.     Breath sounds: Normal breath sounds.  Abdominal:     General: Bowel sounds are normal.     Palpations: Abdomen is soft.     Tenderness: There is abdominal tenderness in the suprapubic area. There is no right CVA tenderness, left CVA tenderness, guarding or rebound.  Musculoskeletal:     Cervical back: Neck supple.  Skin:    General: Skin is warm and dry.  Neurological:     Mental Status: She is alert.      UC Treatments / Results  Labs (all labs ordered are listed, but only abnormal results are displayed) Labs Reviewed  POCT URINALYSIS DIP (MANUAL ENTRY) - Abnormal; Notable for the following components:      Result Value   Blood, UA trace-intact (*)    All other components within normal limits  URINE CULTURE    EKG   Radiology No results  found.  Procedures Procedures (including critical care time)  Medications Ordered in UC Medications - No data to display  Initial Impression / Assessment and Plan / UC Course  I have reviewed the triage vital signs and the nursing notes.  Pertinent labs & imaging results that were available during my care of the patient were reviewed by me and considered in my medical decision making (see chart for details).    Chronic cystitis, dysuria, lower abdominal pain, diarrhea.  Afebrile and vital signs are stable.  Lower abdomen is tender to palpation.  No guarding or rebound.  Patient declines transfer to the ED.  No diarrhea today; 1 formed stool.  She feels her symptoms are caused by the antibiotic she has been taking for UTI.  She has 5 days left of this cefuroxime but would like to change antibiotics due to the abdominal cramping and improved but ongoing dysuria.  Urine has trace blood but no leukocytes or nitrites.  Urine culture pending today.  Changing antibiotic to Cipro.  Instructed her to follow-up with her urologist or PCP on Monday.  ED precautions discussed at length.  Education provided on interstitial cystitis, abdominal pain, diarrhea.  Patient agrees to plan  of care.  Final Clinical Impressions(s) / UC Diagnoses   Final diagnoses:  Chronic cystitis  Dysuria  Lower abdominal pain  Diarrhea, unspecified type     Discharge Instructions      Follow up with your primary care provider or urologist on Monday.  Go to the emergency department if you have worsening symptoms.    Take the Cipro as directed.  Stop the cefuroxime.  The urine culture is pending.  We will call you if it shows the need to change or discontinue your antibiotic.         ED Prescriptions     Medication Sig Dispense Auth. Provider   ciprofloxacin (CIPRO) 500 MG tablet Take 1 tablet (500 mg total) by mouth 2 (two) times daily for 5 days. 10 tablet Mickie Bail, NP      PDMP not reviewed this  encounter.   Mickie Bail, NP 09/26/22 [phone]

## 2022-09-28 LAB — URINE CULTURE: Culture: NO GROWTH

## 2022-10-29 ENCOUNTER — Telehealth: Payer: Self-pay

## 2022-10-29 NOTE — Telephone Encounter (Signed)
Transition Care Management Follow-up Telephone Call Date of discharge and from where: [facility] 9/21 How have you been since you were released from the hospital? Doing well and has followed up with providers Any questions or concerns? No  Items Reviewed: Did the pt receive and understand the discharge instructions provided? Yes  Medications obtained and verified? No  Other? No  Any new allergies since your discharge? No  Dietary orders reviewed? No Do you have support at home? Yes    Follow up appointments reviewed:  PCP Hospital f/u appt confirmed? No Scheduled to see  on  @ . Specialist Hospital f/u appt confirmed? Yes  Scheduled to see  on  @ . Are transportation arrangements needed? No  If their condition worsens, is the pt aware to call PCP or go to the Emergency Dept.? Yes Was the patient provided with contact information for the PCP's office or ED? Yes Was to pt encouraged to call back with questions or concerns? Yes

## 2023-03-24 ENCOUNTER — Encounter: Payer: Self-pay | Admitting: Gastroenterology

## 2023-04-15 ENCOUNTER — Ambulatory Visit (AMBULATORY_SURGERY_CENTER)

## 2023-04-15 VITALS — Ht 68.0 in | Wt 187.0 lb

## 2023-04-15 DIAGNOSIS — Z800 Family history of malignant neoplasm of digestive organs: Secondary | ICD-10-CM

## 2023-04-15 MED ORDER — SUTAB 1479-225-188 MG PO TABS: 24.0000 | ORAL_TABLET | ORAL | 0 refills | Status: DC

## 2023-04-15 NOTE — Progress Notes (Signed)
 Pre visit completed via phone call; Patient verified name, DOB, and address; No egg or soy allergy known to patient  No issues known to pt with past sedation with any surgeries or procedures; Patient denies ever being told they had issues or difficulty with intubation;  No FH of Malignant Hyperthermia; Pt is not on diet pills; Pt is not on home 02;  Pt is not on blood thinners ; Pt reports issues with constipation - patient reports she takes a stool softener and increase fruits/veggies; No A fib or A flutter; Have any cardiac testing pending--NO Insurance verified during PV appt--- BCBS Pt can ambulate without assistance;  Pt denies use of chewing tobacco; Discussed diabetic/weight loss medication holds; Discussed NSAID holds; Checked BMI to be less than 50; Pt instructed to use Single[employer]com or GoodRx for a price reduction on prep;  Patient's chart reviewed by Cathlyn Parsons CNRA prior to previsit and patient appropriate for the [facility];  Pre visit completed and red dot placed by patient's name on their procedure day (on provider's schedule);   Instructions sent to MyChart per patient request; patient aware to use Sutab coupon at pharmacy;

## 2023-04-29 ENCOUNTER — Ambulatory Visit: Payer: Self-pay

## 2023-04-29 ENCOUNTER — Ambulatory Visit: Admitting: Family

## 2023-04-29 ENCOUNTER — Encounter: Payer: Self-pay | Admitting: Family

## 2023-04-29 VITALS — BP 120/68 | HR 64 | Temp 99.1°F | Ht 68.0 in | Wt 192.0 lb

## 2023-04-29 DIAGNOSIS — R10819 Abdominal tenderness, unspecified site: Secondary | ICD-10-CM

## 2023-04-29 DIAGNOSIS — K5790 Diverticulosis of intestine, part unspecified, without perforation or abscess without bleeding: Secondary | ICD-10-CM | POA: Insufficient documentation

## 2023-04-29 DIAGNOSIS — R1032 Left lower quadrant pain: Secondary | ICD-10-CM | POA: Diagnosis not present

## 2023-04-29 LAB — COMPREHENSIVE METABOLIC PANEL WITH GFR
ALT: 28 U/L (ref 0–35)
AST: 25 U/L (ref 0–37)
Albumin: 4.4 g/dL (ref 3.5–5.2)
Alkaline Phosphatase: 77 U/L (ref 39–117)
BUN: 18 mg/dL (ref 6–23)
CO2: 29 meq/L (ref 19–32)
Calcium: 9.4 mg/dL (ref 8.4–10.5)
Chloride: 100 meq/L (ref 96–112)
Creatinine, Ser: 0.94 mg/dL (ref 0.40–1.20)
GFR: 68.82 mL/min (ref >60.00)
Glucose, Bld: 83 mg/dL (ref 70–99)
Potassium: 4.2 meq/L (ref 3.5–5.1)
Sodium: 136 meq/L (ref 135–145)
Total Bilirubin: 0.8 mg/dL (ref 0.2–1.2)
Total Protein: 7 g/dL (ref 6.0–8.3)

## 2023-04-29 LAB — CBC WITH DIFFERENTIAL/PLATELET
Basophils Absolute: 0 10*3/uL (ref 0.0–0.1)
Basophils Relative: 0.4 % (ref 0.0–3.0)
Eosinophils Absolute: 0.1 10*3/uL (ref 0.0–0.7)
Eosinophils Relative: 0.5 % (ref 0.0–5.0)
HCT: 43.3 % (ref 36.0–46.0)
Hemoglobin: 14.5 g/dL (ref 12.0–15.0)
Lymphocytes Relative: 12.7 % (ref 12.0–46.0)
Lymphs Abs: 1.4 10*3/uL (ref 0.7–4.0)
MCHC: 33.4 g/dL (ref 30.0–36.0)
MCV: 89.9 fl (ref 78.0–100.0)
Monocytes Absolute: 0.7 10*3/uL (ref 0.1–1.0)
Monocytes Relative: 6.3 % (ref 3.0–12.0)
Neutro Abs: 8.7 10*3/uL — ABNORMAL HIGH (ref 1.4–7.7)
Neutrophils Relative %: 80.1 % — ABNORMAL HIGH (ref 43.0–77.0)
Platelets: 211 10*3/uL (ref 150.0–400.0)
RBC: 4.82 Mil/uL (ref 3.87–5.11)
RDW: 13.7 % (ref 11.5–15.5)
WBC: 10.8 10*3/uL — ABNORMAL HIGH (ref 4.0–10.5)

## 2023-04-29 LAB — POC URINALSYSI DIPSTICK (AUTOMATED)
Bilirubin, UA: NEGATIVE
Blood, UA: NEGATIVE
Glucose, UA: NEGATIVE
Ketones, UA: NEGATIVE
Leukocytes, UA: NEGATIVE
Nitrite, UA: NEGATIVE
Protein, UA: NEGATIVE
Spec Grav, UA: 1.01 (ref 1.010–1.025)
Urobilinogen, UA: 0.2 U/dL
pH, UA: 6 (ref 5.0–8.0)

## 2023-04-29 MED ORDER — AMOXICILLIN-POT CLAVULANATE 875-125 MG PO TABS
1.0000 | ORAL_TABLET | Freq: Two times a day (BID) | ORAL | 0 refills | Status: AC
Start: 2023-04-29 — End: 2023-05-09

## 2023-04-29 NOTE — Assessment & Plan Note (Signed)
 IC vs UTI vs diverticulitis Symptoms and h/o diverticulitis I have a suspicion for diverticulitis however tenderness mild on palpation  Will defer CT for now and treat for suspect diverticulitis with augmentin   Pt states she tolerates its well despite allergic profile rx augmentin  875/125 mg po bid x 10 days Er precautions discussed  To be thorough will order cbc cmp and also ordering urine

## 2023-04-29 NOTE — Telephone Encounter (Signed)
 Copied from CRM [id number]. Topic: Clinical - Red Word Triage >> Apr 29, 2023  7:49 AM Rosamond Comes wrote: Red Word that prompted transfer to Nurse Triage: patient calling in. Stomach pain real bad. Patient has diverticulitis  Chief Complaint: abd pain Symptoms: diarrhea and pain Frequency: since Tuesday Pertinent Negatives: Patient denies fever, vomiting Disposition: [] ED /[] Urgent Care (no appt availability in office) / [x] Appointment(In office/virtual)/ []  [facility]/ [] Home Care/ [] Refused Recommended Disposition /[] [facility]/ []  Follow-up with PCP Additional Notes: Hx diverticulitis; per protocol apt made for this am; care advice given, denies questions; instructed to go to ER if becomes worse.   Reason for Disposition  [1] MILD-MODERATE pain AND [2] constant AND [3] present > 2 hours  Answer Assessment - Initial Assessment Questions 1. LOCATION: "Where does it hurt?"      Hx diverticulitis; lower abd pain 2. RADIATION: "Does the pain shoot anywhere else?" (e.g., chest, back)     no 3. ONSET: "When did the pain begin?" (e.g., minutes, hours or days ago)      Tuesday 4. SUDDEN: "Gradual or sudden onset?"     gradual 5. PATTERN "Does the pain come and go, or is it constant?"    - If it comes and goes: "How long does it last?" "Do you have pain now?"     (Note: Comes and goes means the pain is intermittent. It goes away completely between bouts.)    - If constant: "Is it getting better, staying the same, or getting worse?"      (Note: Constant means the pain never goes away completely; most serious pain is constant and gets worse.)      constant 6. SEVERITY: "How bad is the pain?"  (e.g., Scale 1-10; mild, moderate, or severe)    - MILD (1-3): Doesn't interfere with normal activities, abdomen soft and not tender to touch.     - MODERATE (4-7): Interferes with normal activities or awakens from sleep, abdomen tender to touch.     - SEVERE (8-10): Excruciating pain,  doubled over, unable to do any normal activities.       5/10 7. RECURRENT SYMPTOM: "Have you ever had this type of stomach pain before?" If Yes, ask: "When was the last time?" and "What happened that time?"      yes 8. CAUSE: "What do you think is causing the stomach pain?"     divertic 9. RELIEVING/AGGRAVATING FACTORS: "What makes it better or worse?" (e.g., antacids, bending or twisting motion, bowel movement)     Using the bathroom makes it better.  10. OTHER SYMPTOMS: "Do you have any other symptoms?" (e.g., back pain, diarrhea, fever, urination pain, vomiting)       Diarrhea, nausea 11. PREGNANCY: "Is there any chance you are pregnant?" "When was your last menstrual period?"       na  Protocols used: Abdominal Pain - [facility]

## 2023-04-29 NOTE — Progress Notes (Signed)
 Established Patient Office Visit  Subjective:   Patient ID: Hannah [name], female    DOB: [birth year]  Age: 53 y.o. MRN: [id number]  CC:  Chief Complaint  Patient presents with   Abdominal Pain    HPI: Hannah [name] is a 53 y.o. female presenting on 04/29/2023 for Abdominal Pain  Two days ago started with increased bowel movements, soft stool, and some abdominal cramping of which after she has a bowel movement she feels better.   Did have diverticulitis back in September 2024 and was treated with augmentin .  Does also have IC, sees urology. Yesterday she had 7 bowel movements so it had progressed and nausea started as well. This am the pain woke her up and she had a lot of pain through most of the am, had two bowel movements, took some tylenol  and increased water intake with some mild relief. She does state she felt as though she had a fever, on tylenol , at current 99.1 F. Heat has been helpful for the pain as well. Has been trying to only eat a liquid diet, however did eat a grilled cheese last night. No mucous or blood in the stool. No vomiting. The pain she feels Is mainly in the mid lower abdomen as well as slight pain on the left lower quadrant.       ROS: Negative unless specifically indicated above in HPI.   Relevant past medical history reviewed and updated as indicated.   Allergies and medications reviewed and updated.   Current Outpatient Medications:    albuterol  (VENTOLIN  HFA) 108 (90 Base) MCG/ACT inhaler, Inhale 2 puffs into the lungs every 4 (four) hours as needed for wheezing., Disp: 18 g, Rfl: 1   ALPRAZolam  (XANAX ) 0.5 MG tablet, TAKE 1 TABLET BY MOUTH TWICE A DAY AS NEEDED FOR ANXIETY, Disp: 60 tablet, Rfl: 5   amoxicillin -clavulanate (AUGMENTIN ) 875-125 MG tablet, Take 1 tablet by mouth 2 (two) times daily for 10 days., Disp: 20 tablet, Rfl: 0   butalbital -aspirin -caffeine  (FIORINAL ) 50-325-40 MG capsule, Take 1 capsule by mouth 2 (two) times daily as  needed for headache., Disp: 30 capsule, Rfl: 1   cyclobenzaprine  (FLEXERIL ) 10 MG tablet, Take 0.5-1 tablets (5-10 mg total) by mouth 3 (three) times daily as needed., Disp: 30 tablet, Rfl: 1   estradiol  (VIVELLE -DOT) 0.05 MG/24HR patch, PLACE 1 PATCH (0.05 MG TOTAL) ONTO THE SKIN 2 (TWO) TIMES A WEEK., Disp: 24 patch, Rfl: 0   HYDROcodone -acetaminophen  (NORCO/VICODIN) 5-325 MG tablet, Take 1 tablet by mouth every 6 (six) hours as needed for severe pain., Disp: 20 tablet, Rfl: 0   hydrocortisone  (ANUSOL -HC) 25 MG suppository, Place 25 mg rectally 2 (two) times daily as needed., Disp: , Rfl:    lidocaine  (LIDODERM ) 5 %, Remove & Discard patch within 12 hours or as directed by MD, Disp: 30 patch, Rfl: 1   ondansetron  (ZOFRAN -ODT) 4 MG disintegrating tablet, Take 1 tablet (4 mg total) by mouth every 8 (eight) hours as needed for nausea or vomiting., Disp: 20 tablet, Rfl: 0   pramoxine-hydrocortisone  (PROCTOCREAM-HC) 1-1 % rectal cream, Place 1 application rectally 2 (two) times daily as needed for hemorrhoids or anal itching., Disp: 30 g, Rfl: 1   promethazine  (PHENERGAN ) 25 MG tablet, Take 1 tablet (25 mg total) by mouth every 8 (eight) hours as needed for nausea or vomiting. Sedation caution, Disp: 20 tablet, Rfl: 1   Sodium Sulfate-Mag Sulfate-KCl (SUTAB ) [phone] MG TABS, Take 24 tablets by mouth as directed. SUTAB  COUPON HQI:[id number]  PCN:CN GROUP:[id number] MEMBER [id number]; no prior authorization, Disp: 24 tablet, Rfl: 0   valACYclovir  (VALTREX ) 1000 MG tablet, Take 2 tablets po q 12 hours x 2 doses with an outbreak, Disp: 30 tablet, Rfl: 1   zolmitriptan  (ZOMIG ) 5 MG tablet, Take 0.5-1 tablets (2.5-5 mg total) by mouth as needed for migraine. May repeat in 2 hours if not resolved.  Note:  Limit of 2 in 24 hours., Disp: 10 tablet, Rfl: 1  Allergies  Allergen Reactions   Alfalfa    Augmentin  [Amoxicillin -Pot Clavulanate]     GI upset with augmentin  but not PNC allergy   Ceftin [Cefuroxime]  Diarrhea and Nausea And Vomiting   Sulfonamide Derivatives Nausea And Vomiting   Tramadol Hives    vomiting   Tramadol Hcl Nausea And Vomiting    Objective:   BP 120/68 (BP Location: Left Arm, Patient Position: Sitting, Cuff Size: Normal)   Pulse 64   Temp 99.1 F (37.3 C) (Temporal)   Ht 5\' 8"  (1.727 m)   Wt 192 lb (87.1 kg)   SpO2 98%   BMI 29.19 kg/m    Physical Exam Vitals reviewed.  Constitutional:      General: She is not in acute distress.    Appearance: Normal appearance. She is normal weight. She is not ill-appearing, toxic-appearing or diaphoretic.  HENT:     Head: Normocephalic.  Cardiovascular:     Rate and Rhythm: Normal rate.  Pulmonary:     Effort: Pulmonary effort is normal.  Abdominal:     General: Bowel sounds are normal.     Palpations: Abdomen is soft.     Tenderness: There is abdominal tenderness in the suprapubic area and left lower quadrant. There is no guarding or rebound.  Musculoskeletal:        General: Normal range of motion.  Neurological:     General: No focal deficit present.     Mental Status: She is alert and oriented to person, place, and time. Mental status is at baseline.  Psychiatric:        Mood and Affect: Mood normal.        Behavior: Behavior normal.        Thought Content: Thought content normal.        Judgment: Judgment normal.     Assessment & Plan:  Left lower quadrant abdominal pain Assessment & Plan: IC vs UTI vs diverticulitis Symptoms and h/o diverticulitis I have a suspicion for diverticulitis however tenderness mild on palpation  Will defer CT for now and treat for suspect diverticulitis with augmentin   Pt states she tolerates its well despite allergic profile rx augmentin  875/125 mg po bid x 10 days Er precautions discussed  To be thorough will order cbc cmp and also ordering urine   Orders: -     CBC with Differential/Platelet -     Comprehensive metabolic panel with GFR -     Amoxicillin -Pot  Clavulanate; Take 1 tablet by mouth 2 (two) times daily for 10 days.  Dispense: 20 tablet; Refill: 0  Diverticulosis -     CBC with Differential/Platelet -     Comprehensive metabolic panel with GFR -     Amoxicillin -Pot Clavulanate; Take 1 tablet by mouth 2 (two) times daily for 10 days.  Dispense: 20 tablet; Refill: 0  Suprapubic tenderness -     POCT Urinalysis Dipstick (Automated)     Follow up plan: Return for f/u PCP if no improvement in symptoms.  Felicita Horns, FNP

## 2023-04-30 ENCOUNTER — Encounter: Payer: Self-pay | Admitting: Family

## 2023-04-30 NOTE — Telephone Encounter (Signed)
 Saw patient already, thank you for speaking with the patient.  Please see note for further information.

## 2023-05-03 ENCOUNTER — Encounter: Payer: Self-pay | Admitting: Gastroenterology

## 2023-05-13 ENCOUNTER — Ambulatory Visit: Admitting: Gastroenterology

## 2023-05-13 ENCOUNTER — Encounter: Payer: Self-pay | Admitting: Gastroenterology

## 2023-05-13 VITALS — BP 111/76 | HR 82 | Temp 98.0°F | Resp 18 | Ht 68.0 in | Wt 187.0 lb

## 2023-05-13 DIAGNOSIS — Z860100 Personal history of colon polyps, unspecified: Secondary | ICD-10-CM | POA: Insufficient documentation

## 2023-05-13 DIAGNOSIS — D123 Benign neoplasm of transverse colon: Secondary | ICD-10-CM

## 2023-05-13 DIAGNOSIS — K5730 Diverticulosis of large intestine without perforation or abscess without bleeding: Secondary | ICD-10-CM

## 2023-05-13 DIAGNOSIS — Z1211 Encounter for screening for malignant neoplasm of colon: Secondary | ICD-10-CM | POA: Diagnosis not present

## 2023-05-13 DIAGNOSIS — K648 Other hemorrhoids: Secondary | ICD-10-CM | POA: Diagnosis not present

## 2023-05-13 MED ORDER — METRONIDAZOLE 500 MG PO TABS: 500.0000 mg | ORAL_TABLET | Freq: Three times a day (TID) | ORAL | 0 refills | Status: AC

## 2023-05-13 MED ORDER — CIPROFLOXACIN HCL 500 MG PO TABS
500.0000 mg | ORAL_TABLET | Freq: Two times a day (BID) | ORAL | 0 refills | Status: AC
Start: 2023-05-13 — End: 2023-05-18

## 2023-05-13 MED ORDER — DICYCLOMINE HCL 10 MG PO CAPS: 10.0000 mg | ORAL_CAPSULE | Freq: Three times a day (TID) | ORAL | 30 refills | Status: AC | PRN

## 2023-05-13 MED ORDER — SODIUM CHLORIDE 0.9 % IV SOLN
500.0000 mL | Freq: Once | INTRAVENOUS | Status: DC
Start: 2023-05-13 — End: 2023-05-13

## 2023-05-13 NOTE — Progress Notes (Signed)
 [facility] History and Physical   Primary Care Physician:  Donnie Galea, MD   Reason for Procedure:   History of colon polyps  Plan:    colonoscopy     HPI: Hannah [name] is a 53 y.o. female  here for colonoscopy surveillance - last exam 04/2020 - 7 polyps removed, 2 advanced in the right colon.    Patient denies any bowel symptoms at this time. Has had left sided diverticulitis since her last exam. 2 aunts with colon cancer known. Otherwise feels well without any cardiopulmonary symptoms.   I have discussed risks / benefits of anesthesia and endoscopic procedure with Kerstin Peeling and they wish to proceed with the exams as outlined today.    Past Medical History:  Diagnosis Date   Calculus of kidney    Chronic headaches    Diverticulitis large intestine 09/2022   Gallstones    Gross hematuria    IC (interstitial cystitis)    per Dr. Aram Knights, s/p botox  injection   Neoplasm of uncertain behavior of bladder    Other chronic cystitis    Other symptoms involving urinary system(788.99)     Past Surgical History:  Procedure Laterality Date   ABDOMINAL HYSTERECTOMY  10/1999   APPENDECTOMY     BILATERAL SALPINGOOPHORECTOMY     BOTOX  INJECTION     in bladder   CESAREAN SECTION     CHOLECYSTECTOMY  10/23/2005   Lap   COLONOSCOPY  2022   SA-MAC-tics/tics/hems-recall due 2025   CT ABD W & PELVIS WO CM  10/2007   Thickening and irreg dome of bladder.  Diverticulosis   Cystoscopy/bladder Bx  11/03/2007   Path...inflammation.  Hydrodistention Excoriations (Dr. Aram Knights)    CYSTOSTOMY W/ BLADDER BIOPSY     LAPAROSCOPIC APPENDECTOMY  10/2013   [facility]   LAPAROSCOPY ABDOMEN DIAGNOSTIC  07/1999   exploration adhesions to ovaries   LUMBAR SPINE SURGERY     Physical Therapy  12/1997; 01/06/1998; 03/1998    Prior to Admission medications   Medication Sig Start Date End Date Taking? Authorizing Provider  ALPRAZolam  (XANAX ) 0.5 MG tablet TAKE 1 TABLET BY MOUTH  TWICE A DAY AS NEEDED FOR ANXIETY 06/21/18  Yes Donnie Galea, MD  hydrocortisone  (ANUSOL -HC) 25 MG suppository Place 25 mg rectally 2 (two) times daily as needed.   Yes [provider]  ondansetron  (ZOFRAN -ODT) 4 MG disintegrating tablet Take 1 tablet (4 mg total) by mouth every 8 (eight) hours as needed for nausea or vomiting. 09/26/22  Yes Bryson Carbine, MD  pramoxine-hydrocortisone  (PROCTOCREAM-HC) 1-1 % rectal cream Place 1 application rectally 2 (two) times daily as needed for hemorrhoids or anal itching. 07/24/16  Yes Donnie Galea, MD  albuterol  (VENTOLIN  HFA) 108 (90 Base) MCG/ACT inhaler Inhale 2 puffs into the lungs every 4 (four) hours as needed for wheezing. 10/18/20   Donnie Galea, MD  butalbital -aspirin -caffeine  (FIORINAL ) 50-325-40 MG capsule Take 1 capsule by mouth 2 (two) times daily as needed for headache. 09/07/19   Donnie Galea, MD  cyclobenzaprine  (FLEXERIL ) 10 MG tablet Take 0.5-1 tablets (5-10 mg total) by mouth 3 (three) times daily as needed. 10/01/17   Donnie Galea, MD  estradiol  (VIVELLE -DOT) 0.05 MG/24HR patch PLACE 1 PATCH (0.05 MG TOTAL) ONTO THE SKIN 2 (TWO) TIMES A WEEK. 04/09/22   Jertson, Jill Evelyn, MD  HYDROcodone -acetaminophen  (NORCO/VICODIN) 5-325 MG tablet Take 1 tablet by mouth every 6 (six) hours as needed for severe pain. 09/26/22   Bryson Carbine,  MD  lidocaine  (LIDODERM ) 5 % Remove & Discard patch within 12 hours or as directed by MD 01/01/20   Donnie Galea, MD  promethazine  (PHENERGAN ) 25 MG tablet Take 1 tablet (25 mg total) by mouth every 8 (eight) hours as needed for nausea or vomiting. Sedation caution 08/07/19   Donnie Galea, MD  valACYclovir  (VALTREX ) 1000 MG tablet Take 2 tablets po q 12 hours x 2 doses with an outbreak 03/31/21   Jertson, Jill Evelyn, MD  zolmitriptan  (ZOMIG ) 5 MG tablet Take 0.5-1 tablets (2.5-5 mg total) by mouth as needed for migraine. May repeat in 2 hours if not resolved.  Note:  Limit of 2 in 24 hours.  05/16/21   Donnie Galea, MD    Current Outpatient Medications  Medication Sig Dispense Refill   ALPRAZolam  (XANAX ) 0.5 MG tablet TAKE 1 TABLET BY MOUTH TWICE A DAY AS NEEDED FOR ANXIETY 60 tablet 5   hydrocortisone  (ANUSOL -HC) 25 MG suppository Place 25 mg rectally 2 (two) times daily as needed.     ondansetron  (ZOFRAN -ODT) 4 MG disintegrating tablet Take 1 tablet (4 mg total) by mouth every 8 (eight) hours as needed for nausea or vomiting. 20 tablet 0   pramoxine-hydrocortisone  (PROCTOCREAM-HC) 1-1 % rectal cream Place 1 application rectally 2 (two) times daily as needed for hemorrhoids or anal itching. 30 g 1   albuterol  (VENTOLIN  HFA) 108 (90 Base) MCG/ACT inhaler Inhale 2 puffs into the lungs every 4 (four) hours as needed for wheezing. 18 g 1   butalbital -aspirin -caffeine  (FIORINAL ) 50-325-40 MG capsule Take 1 capsule by mouth 2 (two) times daily as needed for headache. 30 capsule 1   cyclobenzaprine  (FLEXERIL ) 10 MG tablet Take 0.5-1 tablets (5-10 mg total) by mouth 3 (three) times daily as needed. 30 tablet 1   estradiol  (VIVELLE -DOT) 0.05 MG/24HR patch PLACE 1 PATCH (0.05 MG TOTAL) ONTO THE SKIN 2 (TWO) TIMES A WEEK. 24 patch 0   HYDROcodone -acetaminophen  (NORCO/VICODIN) 5-325 MG tablet Take 1 tablet by mouth every 6 (six) hours as needed for severe pain. 20 tablet 0   lidocaine  (LIDODERM ) 5 % Remove & Discard patch within 12 hours or as directed by MD 30 patch 1   promethazine  (PHENERGAN ) 25 MG tablet Take 1 tablet (25 mg total) by mouth every 8 (eight) hours as needed for nausea or vomiting. Sedation caution 20 tablet 1   valACYclovir  (VALTREX ) 1000 MG tablet Take 2 tablets po q 12 hours x 2 doses with an outbreak 30 tablet 1   zolmitriptan  (ZOMIG ) 5 MG tablet Take 0.5-1 tablets (2.5-5 mg total) by mouth as needed for migraine. May repeat in 2 hours if not resolved.  Note:  Limit of 2 in 24 hours. 10 tablet 1   Current Facility-Administered Medications  Medication Dose Route  Frequency Provider Last Rate Last Admin   0.9 %  sodium chloride  infusion  500 mL Intravenous Once Renton Berkley, Lendon Queen, MD        Allergies as of 05/13/2023 - Review Complete 05/13/2023  Allergen Reaction Noted   Alfalfa Hives 04/23/2012   Tramadol Hives 06/18/2011   Tramadol hcl Nausea And Vomiting 04/15/2023   Ceftin [cefuroxime] Diarrhea and Nausea And Vomiting 09/26/2022   Sulfonamide derivatives Nausea And Vomiting 04/15/2023    Family History  Problem Relation Age of Onset   Colon polyps Mother        mother with polyps before age 58   Heart attack Mother    Diabetes Father    Colon  cancer Maternal Aunt        x 2   Colon cancer Maternal Uncle    Prostate cancer Paternal Uncle    Diabetes Paternal Grandmother    Cancer Neg Hx    Esophageal cancer Neg Hx    Rectal cancer Neg Hx    Stomach cancer Neg Hx    Breast cancer Neg Hx     Social History   Socioeconomic History   Marital status: Married    Spouse name: Not on file   Number of children: 1   Years of education: Not on file   Highest education level: Not on file  Occupational History   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    Social Drivers of Corporate investment banker Strain: Not on file  Food Insecurity: Not on file  Transportation Needs: Not on file  Physical Activity: Not on file  Stress: Not on file  Social Connections: Unknown (03/25/2019)   [facility], [facility]   Social Connections    Frequency of Communication with Friends and Family: Not asked    Frequency of Social  Gatherings with Friends and Family: Not asked  Intimate Partner Violence: Unknown (03/25/2019)   Received from [facility], [facility]   Intimate Partner Violence    Fear of Current or Ex-Partner: Not asked    Emotionally Abused: Not asked    Physically Abused: Not asked    Sexually Abused: Not asked    Review of Systems: All other review of systems negative except as mentioned in the HPI.  Physical Exam: Vital signs BP 128/87   Pulse 94   Temp 98 F (36.7 C) (Temporal)   Ht 5\' 8"  (1.727 m)   Wt 187 lb (84.8 kg)   SpO2 99%   BMI 28.43 kg/m   General:   Alert,  Well-developed, pleasant and cooperative in NAD Lungs:  Clear throughout to auscultation.   Heart:  Regular rate and rhythm Abdomen:  Soft, nontender and nondistended.   Neuro/Psych:  Alert and cooperative. Normal mood and affect. A and O x 3  Christi Coward, MD [facility]

## 2023-05-13 NOTE — Progress Notes (Signed)
 Sedate, gd SR, tolerated procedure well, VSS, report to RN

## 2023-05-13 NOTE — Progress Notes (Signed)
 Called to room to assist during endoscopic procedure.  Patient ID and intended procedure confirmed with present staff. Received instructions for my participation in the procedure from the performing physician.

## 2023-05-13 NOTE — Patient Instructions (Addendum)
 Resume previous diet and medications.  Handouts provided on polyps, hemorrhoids, and diverticulosis.  Biopsy results will be sent via MyChart or letter along with repeat colonoscopy date.  Flagyl, Cipro , and Bentyl ordered per Dr. General Kenner - take as prescribed.    YOU HAD AN ENDOSCOPIC PROCEDURE TODAY AT THE [facility]:   Refer to the procedure report that was given to you for any specific questions about what was found during the examination.  If the procedure report does not answer your questions, please call your gastroenterologist to clarify.  If you requested that your care partner not be given the details of your procedure findings, then the procedure report has been included in a sealed envelope for you to review at your convenience later.  YOU SHOULD EXPECT: Some feelings of bloating in the abdomen. Passage of more gas than usual.  Walking can help get rid of the air that was put into your GI tract during the procedure and reduce the bloating. If you had a lower endoscopy (such as a colonoscopy or flexible sigmoidoscopy) you may notice spotting of blood in your stool or on the toilet paper. If you underwent a bowel prep for your procedure, you may not have a normal bowel movement for a few days.  Please Note:  You might notice some irritation and congestion in your nose or some drainage.  This is from the oxygen used during your procedure.  There is no need for concern and it should clear up in a day or so.  SYMPTOMS TO REPORT IMMEDIATELY:  Following lower endoscopy (colonoscopy or flexible sigmoidoscopy):  Excessive amounts of blood in the stool  Significant tenderness or worsening of abdominal pains  Swelling of the abdomen that is new, acute  Fever of 100F or higher  For urgent or emergent issues, a gastroenterologist can be reached at any hour by calling [phone]. Do not use MyChart messaging for urgent concerns.    DIET:  We do recommend a small meal at first,  but then you may proceed to your regular diet.  Drink plenty of fluids but you should avoid alcoholic beverages for 24 hours.  ACTIVITY:  You should plan to take it easy for the rest of today and you should NOT DRIVE or use heavy machinery until tomorrow (because of the sedation medicines used during the test).    FOLLOW UP: Our staff will call the number listed on your records the next business day following your procedure.  We will call around 7:15- 8:00 am to check on you and address any questions or concerns that you may have regarding the information given to you following your procedure. If we do not reach you, we will leave a message.     If any biopsies were taken you will be contacted by phone or by letter within the next 1-3 weeks.  Please call us  at [phone] if you have not heard about the biopsies in 3 weeks.    SIGNATURES/CONFIDENTIALITY: You and/or your care partner have signed paperwork which will be entered into your electronic medical record.  These signatures attest to the fact that that the information above on your After Visit Summary has been reviewed and is understood.  Full responsibility of the confidentiality of this discharge information lies with you and/or your care-partner.

## 2023-05-13 NOTE — Progress Notes (Signed)
 Pt stated she has had a diverticulitis episode since previsit, had taken augmentin  course.

## 2023-05-13 NOTE — Op Note (Addendum)
 [facility] Patient Name: Hannah [name] Procedure Date: 05/13/2023 11:25 AM MRN: [id number] Endoscopist: Landon Pinion P. General Kenner , MD, [id number] Age: 53 Referring MD:  Date of Birth: October 03, 1970 Gender: Female Account #: [id number] Procedure:                Colonoscopy Indications:              High risk colon cancer surveillance: Personal                            history of colonic polyps - 7 polyps removed                            04/2020, including 2 advanced polyps. History of                            left sided diverticulitis on 09/2022 Medicines:                Monitored Anesthesia Care Procedure:                Pre-Anesthesia Assessment:                           - Prior to the procedure, a History and Physical                            was performed, and patient medications and                            allergies were reviewed. The patient's tolerance of                            previous anesthesia was also reviewed. The risks                            and benefits of the procedure and the sedation                            options and risks were discussed with the patient.                            All questions were answered, and informed consent                            was obtained. Prior Anticoagulants: The patient has                            taken no anticoagulant or antiplatelet agents. ASA                            Grade Assessment: II - A patient with mild systemic                            disease. After reviewing the risks and benefits,  the patient was deemed in satisfactory condition to                            undergo the procedure.                           After obtaining informed consent, the colonoscope                            was passed under direct vision. Throughout the                            procedure, the patient's blood pressure, pulse, and                            oxygen saturations were  monitored continuously. The                            CF HQ190L #[id number] was introduced through the anus                            and advanced to the the cecum, identified by                            appendiceal orifice and ileocecal valve. The                            [id number]-[id number] Colonoscope [id number] was introduced                            through the anus and advanced to the. The                            colonoscopy was technically difficult and complex                            due to restricted mobility of the colon. The                            patient tolerated the procedure well. The quality                            of the bowel preparation was adequate. The                            ileocecal valve, appendiceal orifice, and rectum                            were photographed. Scope In: 11:50:04 AM Scope Out: 12:14:55 PM Scope Withdrawal Time: 0 hours 15 minutes 39 seconds  Total Procedure Duration: 0 hours 24 minutes 51 seconds  Findings:                 The perianal and digital rectal examinations were  normal.                           A 3 mm polyp was found in the transverse colon. The                            polyp was sessile. The polyp was removed with a                            cold snare. Resection and retrieval were complete.                           A 3 mm polyp was found in the transverse colon. The                            polyp was sessile. The polyp was removed with a                            cold biopsy forceps (I could not get good position                            for the snare to grasp the polyp, so it was removed                            with forceps). Resection and retrieval were                            complete.                           Multiple diverticula were found in the left colon                            with restricted mobility of the sigmoid colon. Some                            focal  inflammatory changes noted around a                            diverticulum. The adult colonoscope could not                            traverse this area. Using water immersion the                            pediatric colonoscope was used to complete the exam                            and traverse the area very carefully.                           Internal hemorrhoids were found during retroflexion.  The exam was otherwise without abnormality. Complications:            No immediate complications. Estimated blood loss:                            Minimal. Estimated Blood Loss:     Estimated blood loss was minimal. Impression:               - One 3 mm polyp in the transverse colon, removed                            with a cold snare. Resected and retrieved.                           - One 3 mm polyp in the transverse colon, removed                            with a cold biopsy forceps. Resected and retrieved.                           - Diverticulosis in the left colon with restricted                            mobility of the left colon requiring switch to                            pediatric colonoscope to traverse the area.                           - Internal hemorrhoids.                           - The examination was otherwise normal. Recommendation:           - Patient has a contact number available for                            emergencies. The signs and symptoms of potential                            delayed complications were discussed with the                            patient. Return to normal activities tomorrow.                            Written discharge instructions were provided to the                            patient.                           - Resume previous diet.                           - Continue present medications.                           -  Await pathology results.                           - Start Miralax daily to keep stools on  the looser                            side                           - Start cipro  / flagyl for suspected diverticulitis                            (she relays symptoms in recent weeks, has had                            Augmentin  within the past month)                           - Start bentyl PRN - call if no improvement Jahking Lesser P. Breslyn Abdo, MD 05/13/2023 12:29:56 PM This report has been signed electronically.

## 2023-05-14 ENCOUNTER — Telehealth: Payer: Self-pay

## 2023-05-14 NOTE — Telephone Encounter (Signed)
Attempted to reach patient for post-procedure f/u call. No answer. Left message for her to please not hesitate to call if she has any questions/concerns regarding her care. 

## 2023-05-17 LAB — SURGICAL PATHOLOGY

## 2023-05-18 ENCOUNTER — Ambulatory Visit: Payer: Self-pay | Admitting: Gastroenterology

## 2023-07-01 ENCOUNTER — Telehealth: Payer: Self-pay | Admitting: Family Medicine

## 2023-07-01 NOTE — Telephone Encounter (Signed)
 Scheduled patient  Copied from CRM [id number]. Topic: Appointments - Appointment Scheduling >> Jul 01, 2023  2:42 PM Donna BRAVO wrote: Patient/patient representative is calling to schedule an appointment. Refer to attachments for appointment information.  Patient calling, patient has sinus infection, post nasal drip,little cough, pressure in head, right hip pain >> Jul 01, 2023  2:50 PM Donna E wrote: Patient instating to send a note to nurse asking if the nurse can schedule an appt for 07/02/23 and please call patient .

## 2023-07-01 NOTE — Telephone Encounter (Signed)
  Returned call to patient and scheduled for 6/27  Copied from CRM #[id number]. Topic: Appointments - Appointment Scheduling >> Jul 01, 2023  2:42 PM Donna BRAVO wrote: Patient/patient representative is calling to schedule an appointment. Refer to attachments for appointment information.  Patient calling, patient has sinus infection, post nasal drip,little cough, pressure in head, right hip pain >> Jul 01, 2023  2:50 PM Donna E wrote: Patient instating to send a note to nurse asking if the nurse can schedule an appt for 07/02/23 and please call patient .

## 2023-07-02 ENCOUNTER — Encounter: Payer: Self-pay | Admitting: Family Medicine

## 2023-07-02 ENCOUNTER — Ambulatory Visit: Admitting: Family Medicine

## 2023-07-02 VITALS — BP 126/68 | HR 85 | Temp 100.1°F | Resp 20 | Ht 68.0 in

## 2023-07-02 DIAGNOSIS — U071 COVID-19: Secondary | ICD-10-CM

## 2023-07-02 DIAGNOSIS — M7060 Trochanteric bursitis, unspecified hip: Secondary | ICD-10-CM

## 2023-07-02 DIAGNOSIS — R059 Cough, unspecified: Secondary | ICD-10-CM | POA: Diagnosis not present

## 2023-07-02 LAB — POC COVID19 BINAXNOW: SARS Coronavirus 2 Ag: POSITIVE — AB

## 2023-07-02 MED ORDER — HYDROCODONE BIT-HOMATROP MBR 5-1.5 MG/5ML PO SOLN: 5.0000 mL | Freq: Three times a day (TID) | ORAL | 0 refills | Status: AC | PRN

## 2023-07-02 MED ORDER — LIDOCAINE 5 % EX PTCH: MEDICATED_PATCH | CUTANEOUS | 2 refills | Status: AC

## 2023-07-02 MED ORDER — ALBUTEROL SULFATE HFA 108 (90 BASE) MCG/ACT IN AERS: 2.0000 | INHALATION_SPRAY | RESPIRATORY_TRACT | 1 refills | Status: AC | PRN

## 2023-07-02 MED ORDER — CYCLOBENZAPRINE HCL 10 MG PO TABS: 5.0000 mg | ORAL_TABLET | Freq: Three times a day (TID) | ORAL | 1 refills | Status: AC | PRN

## 2023-07-02 NOTE — Patient Instructions (Addendum)
 Gently stretch and use ice for 5 minutes at a time.  Ibuprofen  with food.  Albuterol  if needed, then hycodan if needed.  Sedation caution.  Update us  as needed.  Take care.  Glad to see you.

## 2023-07-02 NOTE — Progress Notes (Unsigned)
 Covid positive.  Had been at hospital with her dad.  Sx started about 1 week ago.  Had leg aches.  Flu like symptoms per patient report. She has likely had sx for about 1 week, at least.  Then had some cough the last few nights.  Sinus pressure.  Some rhinorrhea.  Ear pressure noted.  Inhaler expired.  Taking nyquil and delsym.    R buttock/lower back pain, had been sleeping at the hospital about 1 month ago.  R lateral hip pain.  Had prev used lidoderm  patches, needed refill.    Her father fell and had a bleed, then aspiration.  Was in ICU for weeks.  He was able to be discharged.    Meds, vitals, and allergies reviewed.   ROS: Per HPI unless specifically indicated in ROS section   Nad Ncat MMM TM w/o erythema.  Neck supple, no LA Rrr Cough but ctab No focal decrease in breath sounds. R greater troch ttp.   Able to bear weight. Back nontender midline.  30 minutes were devoted to patient care in this encounter (this includes time spent reviewing the patient's file/history, interviewing and examining the patient, counseling/reviewing plan with patient).

## 2023-07-04 ENCOUNTER — Ambulatory Visit: Payer: Self-pay | Admitting: Family Medicine

## 2023-07-04 DIAGNOSIS — M7060 Trochanteric bursitis, unspecified hip: Secondary | ICD-10-CM | POA: Insufficient documentation

## 2023-07-04 DIAGNOSIS — U071 COVID-19: Secondary | ICD-10-CM | POA: Insufficient documentation

## 2023-07-04 NOTE — Assessment & Plan Note (Signed)
 With symptoms greater than 1 week.  Nontoxic.  Lungs are clear.  Should improve.  Discussed symptomatic treatment.  Would defer antivirals given overall status and duration.  She agrees.  Albuterol  if needed, then hycodan if needed.  Sedation caution.  Update us  as needed.  She agrees with plan.

## 2023-07-04 NOTE — Assessment & Plan Note (Signed)
 Point tender at right greater trochanter.  Discussed anatomy. Gently stretch and use ice for 5 minutes at a time.  Ibuprofen  with food.

## 2023-07-05 ENCOUNTER — Ambulatory Visit
Admission: RE | Admit: 2023-07-05 | Discharge: 2023-07-05 | Disposition: A | Discharge status: Home / Self Care | Source: Ambulatory Visit | Attending: Family Medicine | Admitting: Family Medicine

## 2023-07-05 ENCOUNTER — Other Ambulatory Visit: Payer: Self-pay | Admitting: Family Medicine

## 2023-07-05 ENCOUNTER — Ambulatory Visit: Admitting: Family Medicine

## 2023-07-05 DIAGNOSIS — Z0000 Encounter for general adult medical examination without abnormal findings: Secondary | ICD-10-CM

## 2023-07-05 DIAGNOSIS — Z1231 Encounter for screening mammogram for malignant neoplasm of breast: Secondary | ICD-10-CM | POA: Diagnosis not present

## 2023-07-07 ENCOUNTER — Ambulatory Visit: Payer: Self-pay | Admitting: Family Medicine

## 2023-08-09 ENCOUNTER — Telehealth: Payer: Self-pay | Admitting: Gastroenterology

## 2023-08-09 NOTE — Telephone Encounter (Signed)
 Inbound call from patient stating she had a colonoscopy on 5/8 and recently has been experiencing some rectal discomfort to the point where she feels pain in her legs. Patient would like an office visit but soonest availability is 9/30   Requesting a call back from nurse   Please advise  Thank you

## 2023-08-09 NOTE — Telephone Encounter (Signed)
 Pt states she had a hard BM and since passing it she has noticed a new small hemorrhoid that is causing her some discomfort. Discussed with pt purchasing a sitz bath and soaking a couple of times/day and using Recticare. Pt scheduled to see Alan Coombs PA 08/17/23@8 :40AM. Pt aware of appt.

## 2023-08-11 ENCOUNTER — Other Ambulatory Visit: Payer: Self-pay | Admitting: Family Medicine

## 2023-08-11 ENCOUNTER — Ambulatory Visit: Payer: Self-pay

## 2023-08-11 MED ORDER — HYDROCORTISONE ACETATE 25 MG RE SUPP: 25.0000 mg | Freq: Two times a day (BID) | RECTAL | 1 refills | Status: AC | PRN

## 2023-08-11 MED ORDER — PRAMOXINE-HC 1-1 % EX CREA: TOPICAL_CREAM | Freq: Three times a day (TID) | CUTANEOUS | 0 refills | Status: AC | PRN

## 2023-08-11 NOTE — Telephone Encounter (Signed)
 FYI Only or Action Required?: Action required by provider: referral request.  Patient was last seen in primary care on 07/02/2023 by Cleatus Arlyss RAMAN, MD.  Called Nurse Triage reporting Hemorrhoids.  Symptoms began several days ago.  Interventions attempted: OTC medications:  SABRA  Symptoms are: gradually worsening.States this is the worse one I've ever had. Has. appointment next week with GI. Asking for refill of hydrocortisone  suppositories.  Triage Disposition: See PCP When Office is Open (Within 3 Days)  Patient/caregiver understands and will follow disposition?: Yes   Reason for Disposition  [1] Home treatment > 3 days for rectal pain AND [2] not improved  Answer Assessment - Initial Assessment Questions 1. SYMPTOM:  What's the main symptom you're concerned about? (e.g., pain, itching, swelling, rash)     Pain 2. ONSET: When did the   start?     2 days 3. RECTAL PAIN: Do you have any pain around your rectum? How bad is the pain?  (Scale 0-10; or none, mild, moderate, severe)     yes 4. RECTAL ITCHING: Do you have any itching in this area? How bad is the itching?  (Scale 0-10; or none, mild, moderate, severe)     mild 5. CONSTIPATION: Do you have constipation? If Yes, ask: How often do you have a bowel movement (BM)?  (Normal range: 3 times a day to every 3 days)  When was your last BM?       Yes, but not now 6. CAUSE: What do you think is causing the anus symptoms?     Hemorrhoid  7. OTHER SYMPTOMS: Do you have any other symptoms?  (e.g., abdomen pain, fever, rectal bleeding, vomiting)     no 8. PREGNANCY: Is there any chance you are pregnant? When was your last menstrual period?     No2 days  Protocols used: Rectal Symptoms-A-AH

## 2023-08-11 NOTE — Telephone Encounter (Signed)
 LOV: 07/02/2023 NOV: NOTHING SCHEDULED LAST REFILL: pramoxine-hydrocortisone  (PROCTOCREAM-HC) 1-1 % rectal cream 07/24/2016

## 2023-08-11 NOTE — Telephone Encounter (Signed)
 Patient notified

## 2023-08-11 NOTE — Telephone Encounter (Signed)
 1st attempt, no answer. Left voicemail for patient to return call for nurse triage.   Message from [place] sent at 08/11/2023  1:36 PM EDT  PT has chronic pain, a very bad hemorrhoid, , can't walk or sit, pt did not want to be NT , She wants a callback from nurse. Pt would like nurse to reach back out to her,

## 2023-08-11 NOTE — Telephone Encounter (Addendum)
 Rx sent for both suppository and cream thanks.

## 2023-08-11 NOTE — Telephone Encounter (Signed)
 Copied from CRM #[id number]. Topic: Clinical - Medication Refill >> Aug 11, 2023  9:11 AM Jasmin G wrote: Medication: hydrocortisone  (ANUSOL -HC) 25 MG suppository  Has the patient contacted their pharmacy? No (Agent: If no, request that the patient contact the pharmacy for the refill. If patient does not wish to contact the pharmacy document the reason why and proceed with request.) (Agent: If yes, when and what did the pharmacy advise?)  This is the patient's preferred pharmacy:  [id number] - [address] [place] [address] Phone: [phone] Fax: [phone]  Is this the correct pharmacy for this prescription? Yes If no, delete pharmacy and type the correct one.   Has the prescription been filled recently? No. Pt stated that the last time this was filled in for her was in 2021, but she is in need of these again. Call her back if needed at  [phone].  Is the patient out of the medication? Yes  Has the patient been seen for an appointment in the last year OR does the patient have an upcoming appointment? No  Can we respond through MyChart? No  Agent: Please be advised that Rx refills may take up to 3 business days. We ask that you follow-up with your pharmacy.

## 2023-08-11 NOTE — Telephone Encounter (Unsigned)
 Copied from CRM #[id number]. Topic: Clinical - Pink Word Triage >> Aug 11, 2023  1:31 PM Frederich PARAS wrote: Reason for Triage: PT has chronic pain, a very bad hemorrhoid, , can't walk or sit, pt did not want to be NT , She wants a callback from nurse. Pt would like nurse to reach back out to her >> Aug 11, 2023  2:16 PM Deleta RAMAN wrote: Patient has a missed call from triage nurse >> Aug 11, 2023  1:36 PM Frederich PARAS wrote: PT has chronic pain, a very bad hemorrhoid, , can't walk or sit, pt did not want to be NT , She wants a callback from nurse. Pt would like nurse to reach back out to her,

## 2023-08-12 NOTE — Telephone Encounter (Signed)
 Closing encounter. Patient was notified

## 2023-08-16 NOTE — Progress Notes (Signed)
 08/17/2023 Hannah [name] [id number] Aug 31, 1970  Referring provider: Cleatus Arlyss RAMAN, MD Primary GI doctor: Dr. Leigh  ASSESSMENT AND PLAN:  Rectal pain after episode of constipation, no rectal bleeding Possibly related to prolapsed internal hemorrhoids, external hemorrhoids, fissure after exam likely prolapsed right posterior hemorrhoid -Sitz baths, increase fiber, increase water -Hydrocortisone  supp given and external cream sent in.  -We discussed hemorrhoid banding here in the office for internal hemorrhoids if not improving with conservative treatment. - will set up follow up with Dr. Lieutenant - will refer to pelvic floor PT with history of ICS and hemorrhoids/constipation  Personal history of advanced colon polyps 05/13/2023 colonoscopy Dr. Leigh adequate prep, difficult and complex due to restricted mobility of the colon, 3 mm polyp transverse colon, 3 mm polyp transverse, diverticulosis left side of the colon restricted mobility required pediatric scope, internal hemorrhoids, started on Cipro  Flagyl  for suspected diverticulitis.  Pathology showed TA polyp recall 5 years.  Recurrent diverticulitis Treated at recent colonoscopy in May 2025 with Cipro  Flagyl , previously treated 09/26/2022 with Augmentin  Had improvement of pain and her symptoms after ABX and dicyclomine  Recommended starting on MiraLAX daily, has been taking 1 capful every other day.  - start on 1/2 capful daily of miralax, adjust every 2-3 days - add on benefiber - consider referral to [facility]  LFT elevation Status post cholecystectomy CT AP WO 09/26/2022 unremarkable liver Remote history 09/26/2022 with AST 100 ALT 139 when patient was in the hospital for diverticulitis treated with Augmentin , question medication related Monitor CBC, LFTs every 6 months  Patient Care Team: Cleatus Arlyss RAMAN, MD as PCP - General (Family Medicine)  HISTORY OF PRESENT ILLNESS: 53 y.o. female with a past medical  history listed below presents for evaluation of rectal pain.   Discussed the use of AI scribe software for clinical note transcription with the patient, who gave verbal consent to proceed.  History of Present Illness   Hannah [name] is a 53 year old female with diverticulosis who presents with constipation and hemorrhoidal pain.  She has a history of diverticulosis and has undergone two colonoscopies, the most recent on May 13, 2023, during which a few polyps were removed. She missed taking Miralax for three days, leading to constipation, which she believes contributed to her current symptoms.  She experiences severe pain associated with a hemorrhoid, which she refers to as an 'old friend' that flares up occasionally. The pain radiates down her leg, and she noted a hard mass that was not present before. She used expired hemorrhoid suppositories, which provided some relief, and later obtained new ones from her primary care provider, which helped alleviate the pain.  She has been using Miralax inconsistently, taking a full capful three to four times a week, but finds it challenging to remember to take it every other day. She prefers taking a half capful daily to improve consistency. She is concerned about developing UTIs, which has made her hesitant to use a sitz bath for relief.  Her social history includes significant stress due to family illness, impacting her eating habits and overall routine. She has a history of interstitial cystitis and frequently experiences UTIs, which she manages with Botox injections every six to nine months.  No rectal bleeding. She has not been using a fiber supplement but is open to incorporating one into her routine to help manage her diverticulosis and prevent future episodes.      She  reports that she has never smoked. She has  never used smokeless tobacco. She reports that she does not drink alcohol and does not use drugs.  RELEVANT GI HISTORY, IMAGING AND  LABS: Results   Colonoscopy: Diverticulosis, few polyps removed (05/13/2023)     CT cardiac score of 08/08/2018 CBC    Component Value Date/Time   WBC 10.8 (H) 04/29/2023 1204   RBC 4.82 04/29/2023 1204   HGB 14.5 04/29/2023 1204   HGB 14.8 10/28/2013 1508   HCT 43.3 04/29/2023 1204   HCT 46.2 10/28/2013 1508   PLT 211.0 04/29/2023 1204   PLT 196 10/28/2013 1508   MCV 89.9 04/29/2023 1204   MCV 91 10/28/2013 1508   MCH 29.8 09/26/2022 1703   MCHC 33.4 04/29/2023 1204   RDW 13.7 04/29/2023 1204   RDW 12.9 10/28/2013 1508   LYMPHSABS 1.4 04/29/2023 1204   MONOABS 0.7 04/29/2023 1204   EOSABS 0.1 04/29/2023 1204   BASOSABS 0.0 04/29/2023 1204   Recent Labs    09/26/22 1703 04/29/23 1204  HGB 14.5 14.5    CMP     Component Value Date/Time   NA 136 04/29/2023 1204   NA 139 10/28/2013 1508   K 4.2 04/29/2023 1204   K 3.9 10/28/2013 1508   CL 100 04/29/2023 1204   CL 104 10/28/2013 1508   CO2 29 04/29/2023 1204   CO2 27 10/28/2013 1508   GLUCOSE 83 04/29/2023 1204   GLUCOSE 114 (H) 10/28/2013 1508   BUN 18 04/29/2023 1204   BUN 11 10/28/2013 1508   CREATININE 0.94 04/29/2023 1204   CREATININE 0.80 03/31/2021 1619   CALCIUM 9.4 04/29/2023 1204   CALCIUM 9.0 10/28/2013 1508   PROT 7.0 04/29/2023 1204   PROT 7.8 10/28/2013 1508   ALBUMIN 4.4 04/29/2023 1204   ALBUMIN 4.1 10/28/2013 1508   AST 25 04/29/2023 1204   AST 29 10/28/2013 1508   ALT 28 04/29/2023 1204   ALT 42 10/28/2013 1508   ALKPHOS 77 04/29/2023 1204   ALKPHOS 89 10/28/2013 1508   BILITOT 0.8 04/29/2023 1204   BILITOT 0.5 10/28/2013 1508   GFRNONAA >60 09/26/2022 1703   GFRNONAA >60 10/28/2013 1508   GFRAA >60 10/28/2013 1508      Latest Ref Rng & Units 04/29/2023   12:04 PM 09/26/2022    5:03 PM 05/16/2021    9:50 AM  Hepatic Function  Total Protein 6.0 - 8.3 g/dL 7.0  7.4  7.6   Albumin 3.5 - 5.2 g/dL 4.4  4.3  4.6   AST 0 - 37 U/L 25  100  23   ALT 0 - 35 U/L 28  139  24   Alk  Phosphatase 39 - 117 U/L 77  109  70   Total Bilirubin 0.2 - 1.2 mg/dL 0.8  0.9  0.5   Bilirubin, Direct 0.0 - 0.3 mg/dL   0.0       Current Medications:   Current Outpatient Medications (Endocrine & Metabolic):    estradiol  (VIVELLE -DOT) 0.05 MG/24HR patch, PLACE 1 PATCH (0.05 MG TOTAL) ONTO THE SKIN 2 (TWO) TIMES A WEEK.   Current Outpatient Medications (Respiratory):    albuterol  (VENTOLIN  HFA) 108 (90 Base) MCG/ACT inhaler, Inhale 2 puffs into the lungs every 4 (four) hours as needed for wheezing.   promethazine  (PHENERGAN ) 25 MG tablet, Take 1 tablet (25 mg total) by mouth every 8 (eight) hours as needed for nausea or vomiting. Sedation caution   HYDROcodone  bit-homatropine (HYCODAN) 5-1.5 MG/5ML syrup, Take 5 mLs by mouth every 8 (eight)  hours as needed for cough. (Patient not taking: Reported on 08/17/2023)  Current Outpatient Medications (Analgesics):    butalbital -aspirin -caffeine  (FIORINAL ) 50-325-40 MG capsule, Take 1 capsule by mouth 2 (two) times daily as needed for headache.   HYDROcodone -acetaminophen  (NORCO/VICODIN) 5-325 MG tablet, Take 1 tablet by mouth every 6 (six) hours as needed for severe pain. (Patient not taking: Reported on 08/17/2023)   zolmitriptan  (ZOMIG ) 5 MG tablet, Take 0.5-1 tablets (2.5-5 mg total) by mouth as needed for migraine. May repeat in 2 hours if not resolved.  Note:  Limit of 2 in 24 hours. (Patient not taking: Reported on 08/17/2023)   Current Outpatient Medications (Other):    hydrocortisone  (ANUSOL -HC) 25 MG suppository, Place 1 suppository (25 mg total) rectally 2 (two) times daily as needed.   ondansetron  (ZOFRAN -ODT) 4 MG disintegrating tablet, Take 1 tablet (4 mg total) by mouth every 8 (eight) hours as needed for nausea or vomiting.   polyethylene glycol powder (GLYCOLAX/MIRALAX) 17 GM/SCOOP powder, Take 17 g by mouth every other day.   pramoxine-hydrocortisone  (ANALPRAM  HC) cream, Apply topically 3 (three) times daily as needed.    ALPRAZolam  (XANAX ) 0.5 MG tablet, TAKE 1 TABLET BY MOUTH TWICE A DAY AS NEEDED FOR ANXIETY (Patient not taking: Reported on 08/17/2023)   cyclobenzaprine  (FLEXERIL ) 10 MG tablet, Take 0.5-1 tablets (5-10 mg total) by mouth 3 (three) times daily as needed. (Patient not taking: Reported on 08/17/2023)   dicyclomine  (BENTYL ) 10 MG capsule, Take 1 capsule (10 mg total) by mouth every 8 (eight) hours as needed for spasms. (Patient not taking: Reported on 08/17/2023)   lidocaine  (LIDODERM ) 5 %, Remove & Discard patch within 12 hours or as directed by MD (Patient not taking: Reported on 08/17/2023)   valACYclovir  (VALTREX ) 1000 MG tablet, Take 2 tablets po q 12 hours x 2 doses with an outbreak (Patient not taking: Reported on 08/17/2023)  Medical History:  Past Medical History:  Diagnosis Date   Calculus of kidney    Chronic headaches    Diverticulitis large intestine 09/2022   Gallstones    Gross hematuria    IC (interstitial cystitis)    per Dr. Ike, s/p botox injection   Neoplasm of uncertain behavior of bladder    Other chronic cystitis    Other symptoms involving urinary system(788.99)    Allergies:  Allergies  Allergen Reactions   Alfalfa Hives   Tramadol Hcl Nausea And Vomiting   Ceftin [Cefuroxime] Diarrhea and Nausea And Vomiting   Sulfonamide Derivatives Nausea And Vomiting     Surgical History:  She  has a past surgical history that includes Abdominal hysterectomy (10/1999); Cholecystectomy (10/23/2005); Physical Therapy (12/1997; 01/06/1998; 03/1998); Laparoscopy abdomen diagnostic (07/1999); CT ABD W & PELVIS WO CM (10/2007); Cystoscopy/bladder Bx (11/03/2007); Lumbar spine surgery; Bilateral salpingoophorectomy; Laparoscopic appendectomy (10/2013); Cesarean section; Appendectomy; Cystostomy w/ bladder biopsy; Botox injection; and Colonoscopy (2022). Family History:  Her family history includes Colon cancer in her maternal aunt and maternal uncle; Colon polyps in her mother;  Diabetes in her father and paternal grandmother; Heart attack in her mother; Prostate cancer in her paternal uncle.  REVIEW OF SYSTEMS  : All other systems reviewed and negative except where noted in the History of Present Illness.  PHYSICAL EXAM: BP 122/76   Pulse 71   Ht 5' 7 (1.702 m)   Wt 189 lb (85.7 kg)   BMI 29.60 kg/m  Physical Exam   MEASUREMENTS: Weight- 150. GENERAL APPEARANCE: Well nourished, in no apparent distress. HEENT: No cervical lymphadenopathy,  unremarkable thyroid , sclerae anicteric, conjunctiva pink. RESPIRATORY: Respiratory effort normal, breath sounds equal bilaterally without rales, rhonchi, or wheezing. CARDIO: Regular rate and rhythm with no murmurs, rubs, or gallops, peripheral pulses intact. ABDOMEN: Soft, non-distended, active bowel sounds in all four quadrants, no tenderness to palpation, no rebound, no mass appreciated. RECTAL: Hemorrhoidal skin tag present, tenderness present, large right posterior hemorrhoid inflamed, small anterior fissure suspected. MUSCULOSKELETAL: Full range of motion, normal gait, without edema. SKIN: Dry, intact without rashes or lesions. No jaundice. NEURO: Alert, oriented, no focal deficits. PSYCH: Cooperative, normal mood and affect.     Rectal:  External exam: Normal external rectal exam, skin tags, possibly small healing anterior fissure Rectal tone: normal rectal tone Internal exam: tender, no masses, brown stool, hemoccult Negative Anoscopy was performed after rectal exam with the patient in the left lateral decubitus position and revealed  Right posterior hemorrhoid No nodule seen   Alan JONELLE Coombs, PA-C 9:16 AM

## 2023-08-17 ENCOUNTER — Encounter: Payer: Self-pay | Admitting: Physician Assistant

## 2023-08-17 ENCOUNTER — Ambulatory Visit: Admitting: Physician Assistant

## 2023-08-17 VITALS — BP 122/76 | HR 71 | Ht 67.0 in | Wt 189.0 lb

## 2023-08-17 DIAGNOSIS — R7989 Other specified abnormal findings of blood chemistry: Secondary | ICD-10-CM

## 2023-08-17 DIAGNOSIS — Z8719 Personal history of other diseases of the digestive system: Secondary | ICD-10-CM

## 2023-08-17 DIAGNOSIS — N3010 Interstitial cystitis (chronic) without hematuria: Secondary | ICD-10-CM

## 2023-08-17 DIAGNOSIS — K644 Residual hemorrhoidal skin tags: Secondary | ICD-10-CM

## 2023-08-17 DIAGNOSIS — Z860101 Personal history of adenomatous and serrated colon polyps: Secondary | ICD-10-CM | POA: Diagnosis not present

## 2023-08-17 DIAGNOSIS — K6289 Other specified diseases of anus and rectum: Secondary | ICD-10-CM

## 2023-08-17 DIAGNOSIS — K648 Other hemorrhoids: Secondary | ICD-10-CM

## 2023-08-17 DIAGNOSIS — Z9049 Acquired absence of other specified parts of digestive tract: Secondary | ICD-10-CM

## 2023-08-17 NOTE — Patient Instructions (Addendum)
 _______________________________________________________  If your blood pressure at your visit was 140/90 or greater, please contact your primary care physician to follow up on this.  _______________________________________________________  If you are age 55 or older, your body mass index should be between 23-30. Your Body mass index is 29.6 kg/m. If this is out of the aforementioned range listed, please consider follow up with your Primary Care Provider.  If you are age 20 or younger, your body mass index should be between 19-25. Your Body mass index is 29.6 kg/m. If this is out of the aformentioned range listed, please consider follow up with your Primary Care Provider.   ________________________________________________________  The [facility] providers would like to encourage you to use MYCHART to communicate with providers for non-urgent requests or questions.  Due to long hold times on the telephone, sending your provider a message by [facility] may be a faster and more efficient way to get a response.  Please allow 48 business hours for a response.  Please remember that this is for non-urgent requests.  _______________________________________________________  [facility] is using a team-based approach to care.  Your team is made up of your doctor and two to three APPS. Our APPS (Nurse Practitioners and Physician Assistants) work with your physician to ensure care continuity for you. They are fully qualified to address your health concerns and develop a treatment plan. They communicate directly with your gastroenterologist to care for you. Seeing the Advanced Practice Practitioners on your physician's team can help you by facilitating care more promptly, often allowing for earlier appointments, access to diagnostic testing, procedures, and other specialty referrals.   You have been scheduled for an appointment with Dr. Leigh on 10-27-23 at 11am . Please arrive 10 minutes early  for your appointment.  Miralax is an osmotic laxative.  It only brings more water into the stool.  This is safe to take daily.  Can take up to 17 gram of miralax twice a day.  Mix with juice or coffee.  Start 1/2 capful at night for 3-4 days and reassess your response in 3-4 days.  You can increase and decrease the dose based on your response.  Remember, it can take up to 3-4 days to take effect OR for the effects to wear off.   I often pair this with benefiber in the morning to help assure the stool is not too loose.  FIBER SUPPLEMENT You can do metamucil or fibercon once or twice a day but if this causes gas/bloating please switch to Benefiber or Citracel.  Fiber is good for constipation/diarrhea/irritable bowel syndrome.  It can also help with weight loss and can help lower your bad cholesterol (LDL).  Please do 1 TBSP in the morning in water, coffee, or tea.  It can take up to a month before you can see a difference with your bowel movements.  It is cheapest from [employer], [employer], [employer].    Diverticulosis Diverticulosis is a condition that develops when small pouches (diverticula) form in the wall of the large intestine (colon). The colon is where water is absorbed and stool (feces) is formed. The pouches form when the inside layer of the colon pushes through weak spots in the outer layers of the colon. You may have a few pouches or many of them. The pouches usually do not cause problems unless they become inflamed or infected. When this happens, the condition is called diverticulitis- this is left lower quadrant pain, diarrhea, fever, chills, nausea or vomiting.  If this  occurs please call the office or go to the hospital. Sometimes these patches without inflammation can also have painless bleeding associated with them, if this happens please call the office or go to the hospital. Preventing constipation and increasing fiber can help reduce diverticula and prevent complications. Even if  you feel you have a high-fiber diet, suggest getting on Benefiber or Cirtracel 2 times daily.   Please do sitz baths- these can be found at the pharmacy. It is a Chief Operating Officer that is put in your toliet.  Please increase fiber or add benefiber, increase water and increase acitivity.  Will send in hydrocoritsone suppository, cheapest with GOODRX from [employer], [employer], [employer] or [employer] if your insurance does not pay for it. If the hemorrhoid suppository sent in is too expensive you can do this over the counter trick.  Apply a pea size amount of generic prescription Anusol  HC cream that has been sent into your pharmacy to the tip of an over the counter PrepH suppository and insert rectally once every night for at least 7 nights.  If this does not improve there are procedures that can be done.   About Hemorrhoids  Hemorrhoids are swollen veins in the lower rectum and anus.  Also called piles, hemorrhoids are a common problem.  Hemorrhoids may be internal (inside the rectum) or external (around the anus).  Internal Hemorrhoids  Internal hemorrhoids are often painless, but they rarely cause bleeding.  The internal veins may stretch and fall down (prolapse) through the anus to the outside of the body.  The veins may then become irritated and painful.  External Hemorrhoids  External hemorrhoids can be easily seen or felt around the anal opening.  They are under the skin around the anus.  When the swollen veins are scratched or broken by straining, rubbing or wiping they sometimes bleed.  How Hemorrhoids Occur  Veins in the rectum and around the anus tend to swell under pressure.  Hemorrhoids can result from increased pressure in the veins of your anus or rectum.  Some sources of pressure are:  Straining to have a bowel movement because of constipation Waiting too long to have a bowel movement Coughing and sneezing often Sitting for extended periods of time, including on the  toilet Diarrhea Obesity Trauma or injury to the anus Some liver diseases Stress Family history of hemorrhoids Pregnancy  Pregnant women should try to avoid becoming constipated, because they are more likely to have hemorrhoids during pregnancy.  In the last trimester of pregnancy, the enlarged uterus may press on blood vessels and causes hemorrhoids.  In addition, the strain of childbirth sometimes causes hemorrhoids after the birth.  Symptoms of Hemorrhoids  Some symptoms of hemorrhoids include: Swelling and/or a tender lump around the anus Itching, mild burning and bleeding around the anus Painful bowel movements with or without constipation Bright red blood covering the stool, on toilet paper or in the toilet bowel.   Symptoms usually go away within a few days.  Always talk to your doctor about any bleeding to make sure it is not from some other causes.  Diagnosing and Treating Hemorrhoids  Diagnosis is made by an examination by your healthcare provider.  Special test can be performed by your doctor.    Most cases of hemorrhoids can be treated with: High-fiber diet: Eat more high-fiber foods, which help prevent constipation.  Ask for more detailed fiber information on types and sources of fiber from your healthcare provider. Fluids: Drink plenty of water.  This helps soften bowel movements so they are easier to pass. Sitz baths and cold packs: Sitting in lukewarm water two or three times a day for 15 minutes cleases the anal area and may relieve discomfort.  If the water is too hot, swelling around the anus will get worse.  Placing a cloth-covered ice pack on the anus for ten minutes four times a day can also help reduce selling.  Gently pushing a prolapsed hemorrhoid back inside after the bath or ice pack can be helpful. Medications: For mild discomfort, your healthcare provider may suggest over-the-counter pain medication or prescribe a cream or ointment for topical use.  The cream  may contain witch hazel, zinc oxide or petroleum jelly.  Medicated suppositories are also a treatment option.  Always consult your doctor before applying medications or creams. Procedures and surgeries: There are also a number of procedures and surgeries to shrink or remove hemorrhoids in more serious cases.  Talk to your physician about these options.  You can often prevent hemorrhoids or keep them from becoming worse by maintaining a healthy lifestyle.  Eat a fiber-rich diet of fruits, vegetables and whole grains.  Also, drink plenty of water and exercise regularly.   2007, [employer] Doc.30

## 2023-08-17 NOTE — Progress Notes (Signed)
 Agree with assessment / plan as outlined.

## 2023-09-25 ENCOUNTER — Other Ambulatory Visit: Payer: Self-pay | Admitting: Physician Assistant

## 2023-09-27 ENCOUNTER — Telehealth: Payer: Self-pay | Admitting: *Deleted

## 2023-09-27 ENCOUNTER — Telehealth: Payer: Self-pay | Admitting: Physician Assistant

## 2023-09-27 DIAGNOSIS — Z8719 Personal history of other diseases of the digestive system: Secondary | ICD-10-CM

## 2023-09-27 DIAGNOSIS — K5732 Diverticulitis of large intestine without perforation or abscess without bleeding: Secondary | ICD-10-CM

## 2023-09-27 MED ORDER — AMOXICILLIN-POT CLAVULANATE 875-125 MG PO TABS: 1.0000 | ORAL_TABLET | Freq: Two times a day (BID) | ORAL | 0 refills | Status: DC

## 2023-09-27 MED ORDER — CIPROFLOXACIN HCL 500 MG PO TABS: 500.0000 mg | ORAL_TABLET | Freq: Two times a day (BID) | ORAL | 0 refills | Status: AC

## 2023-09-27 MED ORDER — METRONIDAZOLE 500 MG PO TABS: 500.0000 mg | ORAL_TABLET | Freq: Three times a day (TID) | ORAL | 0 refills | Status: AC

## 2023-09-27 NOTE — Telephone Encounter (Signed)
 Attempted to reach patient x 2.  Left VM requesting return call.

## 2023-09-27 NOTE — Telephone Encounter (Signed)
 Patient calling stating she is having low-grade temperature discomfort had a bowel movement with improvement tolerating food and liquids, was given metronidazole  but only took 5 of the pills.  Will treat with Augmentin . Consider CT ER precautions discussed Diverticulitis diet given

## 2023-09-27 NOTE — Telephone Encounter (Signed)
 Spoke with patient . Advised prescriptions sent to [facility] Cipro  500 mg BID x 10 days Flagyl  500 mg TID x 10 days Patient had no further questions.

## 2023-09-27 NOTE — Addendum Note (Signed)
 Addended by: CRAIG PALMA on: 09/27/2023 10:42 AM   Modules accepted: Orders

## 2023-09-27 NOTE — Telephone Encounter (Signed)
 Inbound call from patient requesting to speak to the nurse due to her having a really bad diverticulitis flare up. Patient also stated that she finished her antibiotics early accidentally. Patient is also complaining that she has a fever last night. Patient is requesting a call back. Please advise.

## 2023-09-27 NOTE — Telephone Encounter (Signed)
 Patient returned call. Concerned Hannah [name] has not fully recovered.  Lost 5 Flagyl  late last week while taking care of her dad in hospital. Was not able to rest properly , then was bedridden over the weekend due to pain and feeling unwell. Low grade fevers at night. Pain 8/10 at times, but has improved today. Initially 8/10 then had small BM this morning now feeling some better, now 3/10 more on the left. Tolerating soft foods and liquids. Long story short is asking if Hannah [name] can  have another round of meds.Can't afford to not be sick and not be available to care for her dad.

## 2023-10-01 ENCOUNTER — Other Ambulatory Visit: Payer: Self-pay | Admitting: *Deleted

## 2023-10-13 ENCOUNTER — Telehealth: Payer: Self-pay | Admitting: Gastroenterology

## 2023-10-13 NOTE — Telephone Encounter (Signed)
 Inbound call from patient stating she would like to speak to someone in regards to her upcoming hemorrhoid banding on 10/27/23. Patient has a few questions she would like to ask Requesting a call back  Please advise  Thank you

## 2023-10-13 NOTE — Telephone Encounter (Signed)
 Spoke to patient and answered questions in regards to internal hemorrhoid banding. Discussed the procedure and some of the restrictions afterwards. Patient will come for 10/27/23 appointment and discuss further with Dr Leigh any additional questions or concerns she may have before having the procedure.

## 2023-10-27 ENCOUNTER — Encounter: Payer: Self-pay | Admitting: Gastroenterology

## 2023-10-27 ENCOUNTER — Ambulatory Visit: Admitting: Gastroenterology

## 2023-10-27 VITALS — BP 122/78 | HR 76 | Ht 67.0 in | Wt 187.1 lb

## 2023-10-27 DIAGNOSIS — K5900 Constipation, unspecified: Secondary | ICD-10-CM

## 2023-10-27 DIAGNOSIS — Z8719 Personal history of other diseases of the digestive system: Secondary | ICD-10-CM

## 2023-10-27 DIAGNOSIS — K642 Third degree hemorrhoids: Secondary | ICD-10-CM

## 2023-10-27 NOTE — Progress Notes (Signed)
 HPI :  53 year old female here for a follow-up visit for a few issues today.  She is primarily here for hemorrhoid banding however also wanted to discuss a few other issues.  Recall she had a colonoscopy with me in May of this year.  She had a few small polyps removed.  She had significant restricted mobility of the left colon associated with diverticulosis on this exam, technically challenging cecal intubation.  She had appearance of active diverticulitis during the colonoscopy.  She was treated with a course of antibiotics and eventually improved however had to back off her diet for several weeks and it took her several weeks to get over this.  She has since been taking MiraLAX half a cap to Daily for constipation and she has been having a bowel movement once every day or every 2 days.  Her stools seem soft and formed and she feels the MiraLAX has made significant improvement to her symptoms.  She has also been trying to exercise more to reduce risk of diverticulitis.  Her last CT imaging was performed in September 2024 showing sigmoid diverticulitis.  She has had a few episodes of this to date.  She reminds me that she has interstitial cystitis and being followed by urologist for Botox injections which has really helped her.  She tends to take Percocet around the time of those injections as they are painful and that can make her constipation worse and she inquires how to manage that.  Otherwise she has chronic symptoms of hemorrhoids which include prolapse, irritation.  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  She is very interested in hemorrhoid banding to reduce her risk of this happening in the future.  She denies any latex allergies.  We discussed options for treatment of hemorrhoids to include surgery, other medical therapy.  She wanted to proceed with hemorrhoid banding after discussion of risks and benefits.  05/13/2023 colonoscopy Dr. Leigh adequate prep, difficult and complex due to  restricted mobility of the colon, 3 mm polyp transverse colon, 3 mm polyp transverse, diverticulosis left side of the colon restricted mobility required pediatric scope, internal hemorrhoids, started on Cipro  Flagyl  for suspected diverticulitis.  Pathology showed TA polyp recall 5 years.  Past Medical History:  Diagnosis Date   Calculus of kidney    Chronic headaches    Diverticulitis large intestine 09/2022   Gallstones    Gross hematuria    IC (interstitial cystitis)    per Dr. Ike, s/p botox injection   Neoplasm of uncertain behavior of bladder    Other chronic cystitis    Other symptoms involving urinary system(788.99)      Past Surgical History:  Procedure Laterality Date   ABDOMINAL HYSTERECTOMY  10/1999   APPENDECTOMY     BILATERAL SALPINGOOPHORECTOMY     BOTOX INJECTION     in bladder   CESAREAN SECTION     CHOLECYSTECTOMY  10/23/2005   Lap   COLONOSCOPY  2022   SA-MAC-tics/tics/hems-recall due 2025   CT ABD W & PELVIS WO CM  10/2007   Thickening and irreg dome of bladder.  Diverticulosis   Cystoscopy/bladder Bx  11/03/2007   Path...inflammation.  Hydrodistention Excoriations (Dr. Ike)    CYSTOSTOMY W/ BLADDER BIOPSY     LAPAROSCOPIC APPENDECTOMY  10/2013   [facility]   LAPAROSCOPY ABDOMEN DIAGNOSTIC  07/1999   exploration adhesions to ovaries   LUMBAR SPINE SURGERY     Physical Therapy  12/1997; 01/06/1998; 03/1998   Family  History  Problem Relation Age of Onset   Colon polyps Mother        mother with polyps before age 54   Heart attack Mother    Diabetes Father    Colon cancer Maternal Aunt        x 2   Colon cancer Maternal Uncle    Prostate cancer Paternal Uncle    Diabetes Paternal Grandmother    Cancer Neg Hx    Esophageal cancer Neg Hx    Rectal cancer Neg Hx    Stomach cancer Neg Hx    Breast cancer Neg Hx    Social History   Tobacco Use   Smoking status: Never   Smokeless tobacco: Never  Vaping Use   Vaping status: Never Used  Substance  Use Topics   Alcohol use: No   Drug use: No   Current Outpatient Medications  Medication Sig Dispense Refill   albuterol  (VENTOLIN  HFA) 108 (90 Base) MCG/ACT inhaler Inhale 2 puffs into the lungs every 4 (four) hours as needed for wheezing. 18 g 1   ALPRAZolam  (XANAX ) 0.5 MG tablet TAKE 1 TABLET BY MOUTH TWICE A DAY AS NEEDED FOR ANXIETY 60 tablet 5   butalbital -aspirin -caffeine  (FIORINAL ) 50-325-40 MG capsule Take 1 capsule by mouth 2 (two) times daily as needed for headache. 30 capsule 1   cyclobenzaprine  (FLEXERIL ) 10 MG tablet Take 0.5-1 tablets (5-10 mg total) by mouth 3 (three) times daily as needed. 30 tablet 1   estradiol  (VIVELLE -DOT) 0.05 MG/24HR patch PLACE 1 PATCH (0.05 MG TOTAL) ONTO THE SKIN 2 (TWO) TIMES A WEEK. 24 patch 0   hydrocortisone  (ANUSOL -HC) 25 MG suppository Place 1 suppository (25 mg total) rectally 2 (two) times daily as needed. 12 suppository 1   lidocaine  (LIDODERM ) 5 % Remove & Discard patch within 12 hours or as directed by MD 30 patch 2   ondansetron  (ZOFRAN -ODT) 4 MG disintegrating tablet Take 1 tablet (4 mg total) by mouth every 8 (eight) hours as needed for nausea or vomiting. 20 tablet 0   polyethylene glycol powder (GLYCOLAX/MIRALAX) 17 GM/SCOOP powder Take 17 g by mouth every other day.     pramoxine-hydrocortisone  (ANALPRAM  HC) cream Apply topically 3 (three) times daily as needed. 30 g 0   promethazine  (PHENERGAN ) 25 MG tablet Take 1 tablet (25 mg total) by mouth every 8 (eight) hours as needed for nausea or vomiting. Sedation caution 20 tablet 1   valACYclovir  (VALTREX ) 1000 MG tablet Take 2 tablets po q 12 hours x 2 doses with an outbreak 30 tablet 1   zolmitriptan  (ZOMIG ) 5 MG tablet Take 0.5-1 tablets (2.5-5 mg total) by mouth as needed for migraine. May repeat in 2 hours if not resolved.  Note:  Limit of 2 in 24 hours. 10 tablet 1   dicyclomine  (BENTYL ) 10 MG capsule Take 1 capsule (10 mg total) by mouth every 8 (eight) hours as needed for spasms.  (Patient not taking: Reported on 10/27/2023) 90 capsule 30   HYDROcodone  bit-homatropine (HYCODAN) 5-1.5 MG/5ML syrup Take 5 mLs by mouth every 8 (eight) hours as needed for cough. (Patient not taking: Reported on 10/27/2023) 75 mL 0   HYDROcodone -acetaminophen  (NORCO/VICODIN) 5-325 MG tablet Take 1 tablet by mouth every 6 (six) hours as needed for severe pain. (Patient not taking: Reported on 10/27/2023) 20 tablet 0   No current facility-administered medications for this visit.   Allergies  Allergen Reactions   Alfalfa Hives   Tramadol Hcl Nausea And Vomiting   Ceftin [  Cefuroxime] Diarrhea and Nausea And Vomiting   Sulfonamide Derivatives Nausea And Vomiting     Review of Systems: All systems reviewed and negative except where noted in HPI.    No results found.  Physical Exam: BP 122/78   Pulse 76   Ht 5' 7 (1.702 m)   Wt 187 lb 2 oz (84.9 kg)   SpO2 98%   BMI 29.31 kg/m  Constitutional: Pleasant,well-developed, female in no acute distress. Skin: Skin is warm and dry. No rashes noted. Psychiatric: Normal mood and affect. Behavior is normal.   ASSESSMENT: 53 y.o. female here for assessment of the following  1. Grade III hemorrhoids   2. Constipation, unspecified constipation type   3. History of diverticulitis    Longstanding hemorrhoids to include thrombosis in the past, with prolapse and irritation in the setting of chronic constipation.  We discussed the importance of managing her constipation and reduce symptoms moving forward.  She understands this and is compliant with her MiraLAX which is working really well for her lately.  We discussed options for treatment of hemorrhoids to include banding versus surgery versus continued medical therapy.  She strongly wanted to pursue hemorrhoid banding after discussion of risks and benefits.  Procedure note as below, left lateral hemorrhoid banded today.  She has a history of diverticulitis with significant restricted mobility  and diverticulum in the left colon.  She has CT evidence of diverticulitis this past year and again had recurrence around the time of her colonoscopy.  Eventually improved after antibiotics.  We discussed diverticulitis, risks of recurrence moving forward.  Want to keep her stools soft to help get through the sigmoid colon and area of restriction.  She will continue MiraLAX daily and titrate up or down as needed.  Encouraged routine exercise to help reduce risk of diverticulitis.  She will contact me if she has recurrent symptoms, low threshold to repeat a CT scan.   PLAN: - LL hemorrhoid banded as below - continue Miralax daily - when having narcotic for IC treatment, recommend she increase to BID or TID to stay on top of constipation - call if recurrent diverticulitis symptoms - follow up in next several weeks for repeat banding per protocol.  Marcey Naval, MD [facility]      PROCEDURE NOTE: The patient presents with symptomatic grade III  hemorrhoids, requesting rubber band ligation of his/her hemorrhoidal disease.  All risks, benefits and alternative forms of therapy were described and informed consent was obtained.   The anorectum was pre-medicated with 0.125% nitroglycerin The decision was made to band the LL internal hemorrhoid, and the [facility] System was used to perform band ligation without complication.  Digital anorectal examination was then performed to assure proper positioning of the band, and to adjust the banded tissue as required.  The patient was discharged home without pain or other issues.  Dietary and behavioral recommendations were given and along with follow-up instructions.     The following adjunctive treatments were recommended: Continue Miralax  The patient will return in 2-4 weeks for  follow-up and possible additional banding as required. No complications were encountered and the patient tolerated the procedure well.

## 2023-10-27 NOTE — Patient Instructions (Signed)
 HEMORRHOID BANDING PROCEDURE    FOLLOW-UP CARE   The procedure you have had should have been relatively painless since the banding of the area involved does not have nerve endings and there is no pain sensation.  The rubber band cuts off the blood supply to the hemorrhoid and the band may fall off as soon as 48 hours after the banding (the band may occasionally be seen in the toilet bowl following a bowel movement). You may notice a temporary feeling of fullness in the rectum which should respond adequately to plain Tylenol  or Motrin .  Following the banding, avoid strenuous exercise that evening and resume full activity the next day.  A sitz bath (soaking in a warm tub) or bidet is soothing, and can be useful for cleansing the area after bowel movements.     To avoid constipation, take two tablespoons of natural wheat bran, natural oat bran, flax, Benefiber or any over the counter fiber supplement and increase your water intake to 7-8 glasses daily.    Unless you have been prescribed anorectal medication, do not put anything inside your rectum for two weeks: No suppositories, enemas, fingers, etc.  Occasionally, you may have more bleeding than usual after the banding procedure.  This is often from the untreated hemorrhoids rather than the treated one.  Don't be concerned if there is a tablespoon or so of blood.  If there is more blood than this, lie flat with your bottom higher than your head and apply an ice pack to the area. If the bleeding does not stop within a half an hour or if you feel faint, call our office at [phone] or go to the emergency room.  Problems are not common; however, if there is a substantial amount of bleeding, severe pain, chills, fever or difficulty passing urine (very rare) or other problems, you should call us  at [phone] or report to the nearest emergency room.  Do not stay seated continuously for more than 2-3 hours for a day or two after the procedure.   Tighten your buttock muscles 10-15 times every two hours and take 10-15 deep breaths every 1-2 hours.  Do not spend more than a few minutes on the toilet if you cannot empty your bowel; instead re-visit the toilet at a later time.   You have been scheduled for a 2nd banding appointment on ____________ at __________. Please arrive 10 minutes early for registration. If you need to reschedule or cancel this appointment please call [phone] as soon as possible. Thank you.   Thank you for entrusting me with your care and for choosing [facility], Dr. Elspeth Naval   _______________________________________________________  If your blood pressure at your visit was 140/90 or greater, please contact your primary care physician to follow up on this.  _______________________________________________________  If you are age 55 or older, your body mass index should be between 23-30. Your Body mass index is 29.31 kg/m. If this is out of the aforementioned range listed, please consider follow up with your Primary Care Provider.  If you are age 83 or younger, your body mass index should be between 19-25. Your Body mass index is 29.31 kg/m. If this is out of the aformentioned range listed, please consider follow up with your Primary Care Provider.   ________________________________________________________  The [facility] providers would like to encourage you to use MYCHART to communicate with providers for non-urgent requests or questions.  Due to long hold times on the telephone, sending your  provider a message by MYCHART may be a faster and more efficient way to get a response.  Please allow 48 business hours for a response.  Please remember that this is for non-urgent requests.  _______________________________________________________  [facility] is using a team-based approach to care.  Your team is made up of your doctor and two to three APPS. Our APPS (Nurse Practitioners  and Physician Assistants) work with your physician to ensure care continuity for you. They are fully qualified to address your health concerns and develop a treatment plan. They communicate directly with your gastroenterologist to care for you. Seeing the Advanced Practice Practitioners on your physician's team can help you by facilitating care more promptly, often allowing for earlier appointments, access to diagnostic testing, procedures, and other specialty referrals.

## 2023-12-17 ENCOUNTER — Other Ambulatory Visit: Payer: Self-pay | Admitting: Family Medicine

## 2023-12-17 DIAGNOSIS — Z8619 Personal history of other infectious and parasitic diseases: Secondary | ICD-10-CM

## 2023-12-17 NOTE — Telephone Encounter (Unsigned)
 Copied from CRM #[id number]. Topic: Clinical - Medication Refill >> Dec 17, 2023  3:37 PM Emylou G wrote: Medication: valACYclovir  (VALTREX ) 1000 MG tablet  Has the patient contacted their pharmacy? No (Agent: If no, request that the patient contact the pharmacy for the refill. If patient does not wish to contact the pharmacy document the reason why and proceed with request.) (Agent: If yes, when and what did the pharmacy advise?)  This is the patient's preferred pharmacy:  [id number] [address] [facility] - [address] [place] [address] Phone: [phone] Fax: [phone]  Is this the correct pharmacy for this prescription? Yes If no, delete pharmacy and type the correct one.   Has the prescription been filled recently? No  Is the patient out of the medication? Yes  Has the patient been seen for an appointment in the last year OR does the patient have an upcoming appointment? Yes  Can we respond through MyChart? No  Agent: Please be advised that Rx refills may take up to 3 business days. We ask that you follow-up with your pharmacy.

## 2023-12-21 NOTE — Telephone Encounter (Signed)
Not given by you in the past ok to fill?

## 2023-12-22 ENCOUNTER — Encounter: Payer: Self-pay | Admitting: Gastroenterology

## 2023-12-22 ENCOUNTER — Ambulatory Visit: Admitting: Gastroenterology

## 2023-12-22 ENCOUNTER — Other Ambulatory Visit: Payer: Self-pay

## 2023-12-22 VITALS — BP 128/70 | HR 73 | Ht 67.0 in | Wt 186.4 lb

## 2023-12-22 DIAGNOSIS — K642 Third degree hemorrhoids: Secondary | ICD-10-CM | POA: Diagnosis not present

## 2023-12-22 DIAGNOSIS — Z8619 Personal history of other infectious and parasitic diseases: Secondary | ICD-10-CM

## 2023-12-22 MED ORDER — VALACYCLOVIR HCL 1 G PO TABS: ORAL_TABLET | ORAL | 1 refills | Status: AC

## 2023-12-22 NOTE — Telephone Encounter (Signed)
 Given by gyn in past ok to fill?

## 2023-12-22 NOTE — Telephone Encounter (Signed)
 Sent. Thanks.

## 2023-12-22 NOTE — Patient Instructions (Addendum)
 HEMORRHOID BANDING PROCEDURE    FOLLOW-UP CARE   The procedure you have had should have been relatively painless since the banding of the area involved does not have nerve endings and there is no pain sensation.  The rubber band cuts off the blood supply to the hemorrhoid and the band may fall off as soon as 48 hours after the banding (the band may occasionally be seen in the toilet bowl following a bowel movement). You may notice a temporary feeling of fullness in the rectum which should respond adequately to plain Tylenol  or Motrin .  Following the banding, avoid strenuous exercise that evening and resume full activity the next day.  A sitz bath (soaking in a warm tub) or bidet is soothing, and can be useful for cleansing the area after bowel movements.     To avoid constipation, take two tablespoons of natural wheat bran, natural oat bran, flax, Benefiber or any over the counter fiber supplement and increase your water intake to 7-8 glasses daily.    Unless you have been prescribed anorectal medication, do not put anything inside your rectum for two weeks: No suppositories, enemas, fingers, etc.  Occasionally, you may have more bleeding than usual after the banding procedure.  This is often from the untreated hemorrhoids rather than the treated one.  Dont be concerned if there is a tablespoon or so of blood.  If there is more blood than this, lie flat with your bottom higher than your head and apply an ice pack to the area. If the bleeding does not stop within a half an hour or if you feel faint, call our office at [phone] or go to the emergency room.  Problems are not common; however, if there is a substantial amount of bleeding, severe pain, chills, fever or difficulty passing urine (very rare) or other problems, you should call us  at [phone] or report to the nearest emergency room.  Do not stay seated continuously for more than 2-3 hours for a day or two after the procedure.   Tighten your buttock muscles 10-15 times every two hours and take 10-15 deep breaths every 1-2 hours.  Do not spend more than a few minutes on the toilet if you cannot empty your bowel; instead re-visit the toilet at a later time.    Thank you for entrusting me with your care and for choosing [facility], Dr. Elspeth Naval   _______________________________________________________  If your blood pressure at your visit was 140/90 or greater, please contact your primary care physician to follow up on this.  _______________________________________________________  If you are age 52 or older, your body mass index should be between 23-30. Your Body mass index is 29.19 kg/m. If this is out of the aforementioned range listed, please consider follow up with your Primary Care Provider.  If you are age 21 or younger, your body mass index should be between 19-25. Your Body mass index is 29.19 kg/m. If this is out of the aformentioned range listed, please consider follow up with your Primary Care Provider.   ________________________________________________________  The [facility] providers would like to encourage you to use MYCHART to communicate with providers for non-urgent requests or questions.  Due to long hold times on the telephone, sending your provider a message by [facility] may be a faster and more efficient way to get a response.  Please allow 48 business hours for a response.  Please remember that this is for non-urgent requests.  _______________________________________________________  [name] is using a team-based approach to care.  Your team is made up of your doctor and two to three APPS. Our APPS (Nurse Practitioners and Physician Assistants) work with your physician to ensure care continuity for you. They are fully qualified to address your health concerns and develop a treatment plan. They communicate directly with your gastroenterologist to care for you.  Seeing the Advanced Practice Practitioners on your physician's team can help you by facilitating care more promptly, often allowing for earlier appointments, access to diagnostic testing, procedures, and other specialty referrals.

## 2023-12-22 NOTE — Addendum Note (Signed)
 Addended by: SEBASTIAN DANNA GRADE on: 12/22/2023 01:23 PM   Modules accepted: Orders

## 2023-12-22 NOTE — Telephone Encounter (Signed)
 Copied from CRM #[id number]. Topic: Clinical - Medication Refill >> Dec 17, 2023  3:37 PM Emylou G wrote: Medication: valACYclovir  (VALTREX ) 1000 MG tablet  Has the patient contacted their pharmacy? No (Agent: If no, request that the patient contact the pharmacy for the refill. If patient does not wish to contact the pharmacy document the reason why and proceed with request.) (Agent: If yes, when and what did the pharmacy advise?)  This is the patient's preferred pharmacy:  [id number] [address] [facility] - [address] [place] [address] Phone: [phone] Fax: [phone]  Is this the correct pharmacy for this prescription? Yes If no, delete pharmacy and type the correct one.   Has the prescription been filled recently? No  Is the patient out of the medication? Yes  Has the patient been seen for an appointment in the last year OR does the patient have an upcoming appointment? Yes  Can we respond through MyChart? No  Agent: Please be advised that Rx refills may take up to 3 business days. We ask that you follow-up with your pharmacy.

## 2023-12-22 NOTE — Progress Notes (Signed)
 53 y/o female here for a follow up visit for hemorrhoid banding.  She has had chronic constipation and secondary hemorrhoids from this. She has chronic symptoms of hemorrhoids which include prolapse, irritation.  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  LL hemorrhoid banded on 10/8. She tolerated it well, no bleeding or significant discomfort. She does think it has helped her symptoms since the banding. Taking Miralax regularly which has helped her constipation. Overall she is happy with the regimen and wishes to continue with the banding series.   She denies any latex allergies.  We discussed options for treatment of hemorrhoids to include surgery, other medical therapy.  She wanted to proceed with hemorrhoid banding after discussion of risks and benefits.   05/13/2023 colonoscopy adequate prep, difficult and complex due to restricted mobility of the colon, 3 mm polyp transverse colon, 3 mm polyp transverse, diverticulosis left side of the colon restricted mobility required pediatric scope, internal hemorrhoids, started on Cipro  Flagyl  for suspected diverticulitis.  Pathology showed TA polyp recall 5 years.    PROCEDURE NOTE: The patient presents with symptomatic grade III  hemorrhoids, requesting rubber band ligation of his/her hemorrhoidal disease.  All risks, benefits and alternative forms of therapy were described and informed consent was obtained.     The anorectum was pre-medicated with 0.125% nitroglycerin The decision was made to band the RP internal hemorrhoid, and the [facility] ORegan System was used to perform band ligation without complication.  Digital anorectal examination was then performed to assure proper positioning of the band, and to adjust the banded tissue as required.  The patient was discharged home without pain or other issues.  Dietary and behavioral recommendations were given and along with follow-up instructions.     The following adjunctive treatments were  recommended: Continue Miralax   The patient will return in 2-4 weeks for  follow-up and possible additional banding as required. No complications were encountered and the patient tolerated the procedure well.  Marcey Naval, MD [facility]

## 2023-12-22 NOTE — Addendum Note (Signed)
 Addended by: CLEATUS ARLYSS RAMAN on: 12/22/2023 01:53 PM   Modules accepted: Orders

## 2024-01-10 ENCOUNTER — Ambulatory Visit (INDEPENDENT_AMBULATORY_CARE_PROVIDER_SITE_OTHER): Admitting: Gastroenterology

## 2024-01-10 ENCOUNTER — Encounter: Payer: Self-pay | Admitting: Gastroenterology

## 2024-01-10 VITALS — BP 120/72 | HR 79 | Ht 68.0 in | Wt 187.2 lb

## 2024-01-10 DIAGNOSIS — K642 Third degree hemorrhoids: Secondary | ICD-10-CM | POA: Diagnosis not present

## 2024-01-10 MED ORDER — CALMOL-4 76-10 % RE SUPP: RECTAL | Status: AC

## 2024-01-10 NOTE — Progress Notes (Signed)
 54 y/o female here for a follow up visit for hemorrhoid banding.   She has had chronic constipation and secondary hemorrhoids from this. She has chronic symptoms of hemorrhoids which include prolapse, irritation.  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  LL hemorrhoid banded on 10/8. RP hemorrhoid banded on 12/17  She has tolerated it well so far. She did have slight discomfort after the last bad but resolved within a few days. She does think banding has helped her symptoms since we started and wants to complete the protocol today.    She denies any latex allergies.  We discussed options for treatment of hemorrhoids to include surgery, other medical therapy.  She wanted to proceed with hemorrhoid banding after discussion of risks and benefits.   05/13/2023 colonoscopy adequate prep, difficult and complex due to restricted mobility of the colon, 3 mm polyp transverse colon, 3 mm polyp transverse, diverticulosis left side of the colon restricted mobility required pediatric scope, internal hemorrhoids, started on Cipro  Flagyl  for suspected diverticulitis.  Pathology showed TA polyp recall 5 years.    PROCEDURE NOTE: The patient presents with symptomatic grade III  hemorrhoids, requesting rubber band ligation of his/her hemorrhoidal disease.  All risks, benefits and alternative forms of therapy were described and informed consent was obtained.   The anorectum was pre-medicated with The decision was made to band the RA  internal hemorrhoid, and the [facility] ORegan System was used to perform band ligation without complication.  Digital anorectal examination was then performed to assure proper positioning of the band, and to adjust the banded tissue as required.  The patient was discharged home without pain or other issues.  Dietary and behavioral recommendations were given and along with follow-up instructions.     The following adjunctive treatments were recommended: Continue Miralax Add Calmol4 supp PRN     Marcey Naval, MD [facility]

## 2024-01-10 NOTE — Patient Instructions (Addendum)
 HEMORRHOID BANDING PROCEDURE    FOLLOW-UP CARE   The procedure you have had should have been relatively painless since the banding of the area involved does not have nerve endings and there is no pain sensation.  The rubber band cuts off the blood supply to the hemorrhoid and the band may fall off as soon as 48 hours after the banding (the band may occasionally be seen in the toilet bowl following a bowel movement). You may notice a temporary feeling of fullness in the rectum which should respond adequately to plain Tylenol  or Motrin .  Following the banding, avoid strenuous exercise that evening and resume full activity the next day.  A sitz bath (soaking in a warm tub) or bidet is soothing, and can be useful for cleansing the area after bowel movements.     To avoid constipation, take two tablespoons of natural wheat bran, natural oat bran, flax, Benefiber or any over the counter fiber supplement and increase your water intake to 7-8 glasses daily.    Unless you have been prescribed anorectal medication, do not put anything inside your rectum for two weeks: No suppositories, enemas, fingers, etc.  Occasionally, you may have more bleeding than usual after the banding procedure.  This is often from the untreated hemorrhoids rather than the treated one.  Dont be concerned if there is a tablespoon or so of blood.  If there is more blood than this, lie flat with your bottom higher than your head and apply an ice pack to the area. If the bleeding does not stop within a half an hour or if you feel faint, call our office at [phone] or go to the emergency room.  Problems are not common; however, if there is a substantial amount of bleeding, severe pain, chills, fever or difficulty passing urine (very rare) or other problems, you should call us  at [phone] or report to the nearest emergency room.  Do not stay seated continuously for more than 2-3 hours for a day or two after the procedure.   Tighten your buttock muscles 10-15 times every two hours and take 10-15 deep breaths every 1-2 hours.  Do not spend more than a few minutes on the toilet if you cannot empty your bowel; instead re-visit the toilet at a later time.    We have given you samples of the following medication to use: Calmol 4 suppositories: use rectally but not for 2 weeks after banding    Thank you for entrusting me with your care and for choosing [facility], Dr. Elspeth Naval    _______________________________________________________  If your blood pressure at your visit was 140/90 or greater, please contact your primary care physician to follow up on this.  _______________________________________________________  If you are age 66 or older, your body mass index should be between 23-30. Your Body mass index is 28.47 kg/m. If this is out of the aforementioned range listed, please consider follow up with your Primary Care Provider.  If you are age 24 or younger, your body mass index should be between 19-25. Your Body mass index is 28.47 kg/m. If this is out of the aformentioned range listed, please consider follow up with your Primary Care Provider.   ________________________________________________________  The [facility] providers would like to encourage you to use MYCHART to communicate with providers for non-urgent requests or questions.  Due to long hold times on the telephone, sending your provider a message by [facility] may be a faster and more efficient  way to get a response.  Please allow 48 business hours for a response.  Please remember that this is for non-urgent requests.  _______________________________________________________  [facility] is using a team-based approach to care.  Your team is made up of your doctor and two to three APPS. Our APPS (Nurse Practitioners and Physician Assistants) work with your physician to ensure care continuity for you. They are fully  qualified to address your health concerns and develop a treatment plan. They communicate directly with your gastroenterologist to care for you. Seeing the Advanced Practice Practitioners on your physician's team can help you by facilitating care more promptly, often allowing for earlier appointments, access to diagnostic testing, procedures, and other specialty referrals.
